# Patient Record
Sex: Female | Born: 1955 | Race: White | Hispanic: No | Smoking: Current every day smoker
Health system: Southern US, Community
[De-identification: ages and names within clinical notes are randomized; demographics above are authoritative.]

## PROBLEM LIST (undated history)

## (undated) DIAGNOSIS — M25562 Pain in left knee: Secondary | ICD-10-CM

## (undated) DIAGNOSIS — E1165 Type 2 diabetes mellitus with hyperglycemia: Secondary | ICD-10-CM

## (undated) DIAGNOSIS — C7931 Secondary malignant neoplasm of brain: Secondary | ICD-10-CM

## (undated) DIAGNOSIS — F32A Depression, unspecified: Secondary | ICD-10-CM

## (undated) DIAGNOSIS — F319 Bipolar disorder, unspecified: Secondary | ICD-10-CM

## (undated) DIAGNOSIS — D649 Anemia, unspecified: Secondary | ICD-10-CM

## (undated) DIAGNOSIS — K5900 Constipation, unspecified: Secondary | ICD-10-CM

## (undated) DIAGNOSIS — J449 Chronic obstructive pulmonary disease, unspecified: Secondary | ICD-10-CM

## (undated) DIAGNOSIS — I4891 Unspecified atrial fibrillation: Secondary | ICD-10-CM

## (undated) DIAGNOSIS — Z9289 Personal history of other medical treatment: Secondary | ICD-10-CM

## (undated) DIAGNOSIS — R32 Unspecified urinary incontinence: Secondary | ICD-10-CM

## (undated) DIAGNOSIS — F419 Anxiety disorder, unspecified: Secondary | ICD-10-CM

## (undated) DIAGNOSIS — F410 Panic disorder [episodic paroxysmal anxiety] without agoraphobia: Secondary | ICD-10-CM

## (undated) DIAGNOSIS — F329 Major depressive disorder, single episode, unspecified: Secondary | ICD-10-CM

## (undated) DIAGNOSIS — C3490 Malignant neoplasm of unspecified part of unspecified bronchus or lung: Secondary | ICD-10-CM

## (undated) DIAGNOSIS — I10 Essential (primary) hypertension: Secondary | ICD-10-CM

## (undated) DIAGNOSIS — G8929 Other chronic pain: Secondary | ICD-10-CM

## (undated) DIAGNOSIS — E1142 Type 2 diabetes mellitus with diabetic polyneuropathy: Secondary | ICD-10-CM

## (undated) DIAGNOSIS — Z923 Personal history of irradiation: Secondary | ICD-10-CM

## (undated) DIAGNOSIS — IMO0001 Reserved for inherently not codable concepts without codable children: Secondary | ICD-10-CM

## (undated) DIAGNOSIS — Z9221 Personal history of antineoplastic chemotherapy: Secondary | ICD-10-CM

## (undated) DIAGNOSIS — E7800 Pure hypercholesterolemia, unspecified: Secondary | ICD-10-CM

## (undated) DIAGNOSIS — K219 Gastro-esophageal reflux disease without esophagitis: Secondary | ICD-10-CM

## (undated) HISTORY — DX: Type 2 diabetes mellitus with diabetic polyneuropathy: E11.42

## (undated) HISTORY — DX: Reserved for inherently not codable concepts without codable children: IMO0001

## (undated) HISTORY — DX: Type 2 diabetes mellitus with hyperglycemia: E11.65

## (undated) HISTORY — DX: Essential (primary) hypertension: I10

## (undated) HISTORY — DX: Unspecified atrial fibrillation: I48.91

## (undated) HISTORY — DX: Pain in left knee: M25.562

## (undated) HISTORY — PX: SKIN GRAFT: SHX250

## (undated) HISTORY — DX: Malignant neoplasm of unspecified part of unspecified bronchus or lung: C34.90

## (undated) HISTORY — DX: Other chronic pain: G89.29

---

## 1998-04-10 ENCOUNTER — Emergency Department (HOSPITAL_COMMUNITY): Admission: EM | Admit: 1998-04-10 | Discharge: 1998-04-10 | Payer: Self-pay | Admitting: Emergency Medicine

## 2005-07-16 ENCOUNTER — Emergency Department (HOSPITAL_COMMUNITY): Admission: EM | Admit: 2005-07-16 | Discharge: 2005-07-16 | Payer: Self-pay | Admitting: Emergency Medicine

## 2012-11-10 ENCOUNTER — Ambulatory Visit (INDEPENDENT_AMBULATORY_CARE_PROVIDER_SITE_OTHER): Payer: No Typology Code available for payment source | Admitting: Internal Medicine

## 2012-11-10 ENCOUNTER — Encounter: Payer: Self-pay | Admitting: Internal Medicine

## 2012-11-10 VITALS — BP 110/72 | HR 98 | Temp 98.5°F | Ht 65.5 in | Wt 205.2 lb

## 2012-11-10 DIAGNOSIS — E785 Hyperlipidemia, unspecified: Secondary | ICD-10-CM

## 2012-11-10 DIAGNOSIS — G8929 Other chronic pain: Secondary | ICD-10-CM | POA: Insufficient documentation

## 2012-11-10 DIAGNOSIS — I10 Essential (primary) hypertension: Secondary | ICD-10-CM | POA: Insufficient documentation

## 2012-11-10 DIAGNOSIS — M25569 Pain in unspecified knee: Secondary | ICD-10-CM

## 2012-11-10 DIAGNOSIS — R5383 Other fatigue: Secondary | ICD-10-CM

## 2012-11-10 DIAGNOSIS — E669 Obesity, unspecified: Secondary | ICD-10-CM

## 2012-11-10 DIAGNOSIS — M25562 Pain in left knee: Secondary | ICD-10-CM

## 2012-11-10 MED ORDER — AGAMATRIX PRESTO W/DEVICE KIT: 1.0000 | PACK | Freq: Once | Status: DC

## 2012-11-10 MED ORDER — IBUPROFEN 200 MG PO TABS: 400.0000 mg | ORAL_TABLET | Freq: Four times a day (QID) | ORAL | Status: DC | PRN

## 2012-11-10 MED ORDER — GLUCOSE BLOOD VI STRP: ORAL_STRIP | Status: DC

## 2012-11-10 MED ORDER — OMEGA-3 FATTY ACIDS 1000 MG PO CAPS: 2.0000 g | ORAL_CAPSULE | Freq: Every day | ORAL | Status: DC

## 2012-11-10 MED ORDER — METFORMIN HCL 1000 MG PO TABS: 1000.0000 mg | ORAL_TABLET | Freq: Two times a day (BID) | ORAL | Status: DC

## 2012-11-10 NOTE — Progress Notes (Signed)
Patient: Anita Gardner   MRN: [id number]  DOB: 03-24-1956  PCP: Elyn Peers, DO   Subjective:    HPI: Ms. Anita Gardner is a 56 y.o. female with a PMHx of uncontrolled DM2, HTN, and knee pain who presented to clinic today for the following:  1) DM2, uncontrolled -  Patient not checking her blood. Denies assisted hypoglycemia or recently hospitalizations for either hyper or hypoglycemia. admits to polyuria, Denies polydipsia, nausea, vomiting, diarrhea, abdominal pain. The patient indicates that she does occasionally have lightheadedness, however she does not check her blood sugar at this time, therefore is unable to assess if she is getting hypoglycemic. Is on Metformin 1000mg  BID and Glyburide 5 mg BID. does request refills today.  In regards to diabetic complications:  Microvascular complications: Confirms: none ; Denies nephropathy, retinopathy and peripheral neuropathy.  Macrovascular complications: Confirms: none ; Denies cardiovascular disease, cerebrovascular disease and peripheral vascular disease.   Important diabetic medications: Is patient on aspirin? No Is patient on a statin? No Is patient on an ACE-I/ ARB? Yes  2) HTN - Patient does not check blood pressure regularly at home. Currently taking Lisinopril 20mg . Patient misses doses 0 x per week on average. denies headaches, dizziness, lightheadedness, chest pain, shortness of breath.  does not request refills today.   3) Knee pain -  Patient describes a 2 years history of gradually worsening, constant unless at rest, aching left knee pain. Episodes can last for a day at a time. Currently, the pain is rated a 8/10 in severity. At its worst, the pain is rated a 10/10 in severity. Pain is worse with activity she admits to inciting injury, specifically she had fallen on this knee several years ago. She confirms occasional popping sensations, but denies giving way, locking sensations. States that she has previously  been on Naproxen 500mg  BID (but is not currently on that medication) - she found this somewhat effective in the past, but feels that she needs something stronger. She is aware that because she is just establishing care today, and I have no historical labs, I will not be prescribing anything for her today.   Review of Systems: Per HPI and patient also confirms: Hair thinning.   Current Outpatient Medications: Medication Sig  . cyclobenzaprine (FLEXERIL) 5 MG tablet Take 5 mg by mouth 3 (three) times daily as needed.  . glyBURIDE (DIABETA) 2.5 MG tablet Take 5 mg by mouth 2 (two) times daily with a meal.  . lisinopril (PRINIVIL,ZESTRIL) 20 MG tablet Take 20 mg by mouth daily.  . metFORMIN (GLUCOPHAGE) 1000 MG tablet Take 1,000 mg by mouth 2 (two) times daily with a meal.  . naproxen (NAPROSYN) 500 MG tablet Take 500 mg by mouth 2 (two) times daily as needed.     Allergies  Allergen Reactions  . Ampicillin Hives    Past Medical History  Diagnosis Date  . Hypertension   . Diabetes mellitus type 2, uncontrolled, without complications DX: 2000  . Chronic pain of left knee     Past Surgical History  Procedure Date  . No past surgeries     Family History  Problem Relation Age of Onset  . CAD Mother 34  . Diabetes Mother     Social History History   Social History  . Marital Status: Unknown    Spouse Name: N/A    Number of Children: 2  . Years of Education: 10th grade   Occupational History  . 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 Smoking status: Former Smoker -- 0.8 packs/day for 20 years    Types: Cigarettes    Quit date: 11/23/2011  . Smokeless tobacco: Not on file  . Alcohol Use: No  . Drug Use: No  . Sexually Active: Not on file   Other Topics Concern  . Not on file   Social History Narrative   Lives at [facility] alone     Objective:    Physical Exam: Filed Vitals:   11/10/12 1559  BP:  110/72  Pulse: 98  Temp: 98.5 F (36.9 C)     General: Vital signs reviewed and noted. Well-developed, well-nourished, in no acute distress; alert, appropriate and cooperative throughout examination.  Head: Normocephalic, atraumatic.  Eyes: conjunctivae/corneas clear. PERRL, EOM's intact.  Ears: TM nonerythematous, not bulging, good light reflex bilaterally.  Nose: Mucous membranes moist, not inflammed, nonerythematous.  Throat: Oropharynx nonerythematous, no exudate appreciated.   Neck: No deformities, masses, or tenderness noted.  Lungs:  Normal respiratory effort. Clear to auscultation BL without crackles or wheezes.  Heart: RRR. S1 and S2 normal without gallop, rubs. (+) murmur.  Abdomen:  BS normoactive. Soft, Nondistended, non-tender.  No masses or organomegaly.  Extremities: No pretibial edema. Crepitation noted bilateral with knee flexion and extension. No edema, no erythema, no warmth of bilateral knees.   Neurologic: A&O X3, CN II - XII are grossly intact. Motor strength is 5/5 in the all 4 extremities, Sensations intact to light touch, Cerebellar signs negative.    Assessment/ Plan:   Case and plan of care discussed with attending physician, Dr. Blanch Media.

## 2012-11-10 NOTE — Patient Instructions (Signed)
General Instructions:  Please follow-up at the clinic in 2-3 weeks, at which time we will reevaluate YOUR KNEE PAIN, diabetes, blood pressure, fatigue- OR, please follow-up in the clinic sooner if needed.  There have not been changes in your medications:  Prescription for a glucometer has been sent to the health department  Check your blood sugars once daily before breakfast or if you are feeling bad.    You are getting labs today, if they are abnormal I will give you a call.   You can use Ibuprofen for your knee pain - take with meals and stop if your stomach is hurting.  If you have been started on new medication(s), and you develop symptoms concerning for allergic reaction, including, but not limited to, throat closing, tongue swelling, rash, please stop the medication immediately and call the clinic at [phone], and go to the ER.  If you are diabetic, please bring your meter to your next visit.  If symptoms worsen, or new symptoms arise, please call the clinic or go to the ER.  PLEASE BRING ALL OF YOUR MEDICATIONS  IN A BAG TO YOUR NEXT APPOINTMENT

## 2012-11-11 DIAGNOSIS — R5383 Other fatigue: Secondary | ICD-10-CM | POA: Insufficient documentation

## 2012-11-11 DIAGNOSIS — E669 Obesity, unspecified: Secondary | ICD-10-CM | POA: Insufficient documentation

## 2012-11-11 DIAGNOSIS — E785 Hyperlipidemia, unspecified: Secondary | ICD-10-CM | POA: Insufficient documentation

## 2012-11-11 LAB — LIPID PANEL
HDL: 35 mg/dL — ABNORMAL LOW (ref >39)
LDL Cholesterol: 110 mg/dL — ABNORMAL HIGH (ref 0–99)
Triglycerides: 281 mg/dL — ABNORMAL HIGH (ref <150)
VLDL: 56 mg/dL — ABNORMAL HIGH (ref 0–40)

## 2012-11-11 LAB — COMPREHENSIVE METABOLIC PANEL
ALT: 20 U/L (ref 0–35)
AST: 16 U/L (ref 0–37)
Albumin: 4.7 g/dL (ref 3.5–5.2)
Alkaline Phosphatase: 84 U/L (ref 39–117)
Calcium: 10.1 mg/dL (ref 8.4–10.5)
Glucose, Bld: 263 mg/dL — ABNORMAL HIGH (ref 70–99)
Potassium: 4.1 mEq/L (ref 3.5–5.3)
Sodium: 132 mEq/L — ABNORMAL LOW (ref 135–145)
Total Protein: 7.5 g/dL (ref 6.0–8.3)

## 2012-11-11 LAB — MICROALBUMIN / CREATININE URINE RATIO
Creatinine, Urine: 352.7 mg/dL
Microalb Creat Ratio: 88.7 mg/g — ABNORMAL HIGH (ref 0.0–30.0)

## 2012-11-11 LAB — CBC WITH DIFFERENTIAL/PLATELET
Basophils Relative: 1 % (ref 0–1)
Eosinophils Absolute: 0.1 10*3/uL (ref 0.0–0.7)
HCT: 45.8 % (ref 36.0–46.0)
Hemoglobin: 16.1 g/dL — ABNORMAL HIGH (ref 12.0–15.0)
Lymphs Abs: 2.4 10*3/uL (ref 0.7–4.0)
MCH: 32.3 pg (ref 26.0–34.0)
MCHC: 35.2 g/dL (ref 30.0–36.0)
Monocytes Absolute: 0.6 10*3/uL (ref 0.1–1.0)
Monocytes Relative: 6 % (ref 3–12)
Neutro Abs: 7 10*3/uL (ref 1.7–7.7)
Platelets: 343 10*3/uL (ref 150–400)
RBC: 4.99 MIL/uL (ref 3.87–5.11)

## 2012-11-11 LAB — HEMOGLOBIN A1C
Hgb A1c MFr Bld: 10.2 % — ABNORMAL HIGH (ref <5.7)
Mean Plasma Glucose: 246 mg/dL — ABNORMAL HIGH (ref <117)

## 2012-11-11 LAB — TSH: TSH: 1.327 u[IU]/mL (ref 0.350–4.500)

## 2012-11-11 NOTE — Assessment & Plan Note (Signed)
Pertinent Labs: Liver Function Tests:    Component Value Date/Time   AST 16 11/10/2012 1652   ALT 20 11/10/2012 1652   ALKPHOS 84 11/10/2012 1652   BILITOT 0.8 11/10/2012 1652   PROT 7.5 11/10/2012 1652   ALBUMIN 4.7 11/10/2012 1652    Lipid Panel:     Component Value Date/Time   CHOL 201* 11/10/2012 1652   TRIG 281* 11/10/2012 1652   HDL 35* 11/10/2012 1652   CHOLHDL 5.7 11/10/2012 1652   VLDL 56* 11/10/2012 1652   LDLCALC 110* 11/10/2012 1652     Assessment: Goal LDL (per ATP guidelines): < 100 mg/dL  Disease Control: not controlled  Progress toward goals: unable to assess  Barriers to meeting goals: financial need    Patient is currently just on fish oil, no statin.   Plan:  continue current medications  Will call pt and plan to initiate statin with recheck lipid panel in 6 weeks after statin initiation.

## 2012-11-11 NOTE — Progress Notes (Signed)
Quick Note:  Uncontrolled DM with proteinuria. On an ACE-I already. Recheck 1 year. ______

## 2012-11-11 NOTE — Assessment & Plan Note (Addendum)
Pertinent Labs: Lab Results  Component Value Date   HGBA1C 10.2* 11/10/2012   CREATININE 0.62 11/10/2012   MICROALBUR 31.30* 11/10/2012   MICRALBCREAT 88.7* 11/10/2012   CHOL 201* 11/10/2012   HDL 35* 11/10/2012   TRIG 161* 11/10/2012    Assessment: Disease Control:   Uncontrolled  Progress toward goals:   Unable to assess  Barriers to meeting goals: financial need  Microvascular complications: none  Macrovascular complications: none  On aspirin: No: 1% 10-year risk of CV event, therefore, not indicated at this time.     On statin: No: getting lipid panel today.  On ACE-I/ ARB: Yes    Patient is unclear about her compliance given that she has not had regularly clinic follow-up since 2012 at prior PCP.  with prescribed medications.   Her A1c and additional labs resulted after our clinic visit today.   I will not adjust her medication today, as unclear if this A1c reflects poor blood sugar control despite adequate oral regimen OR if 2/2 medication noncompliance. As well, since she does not have a glucometer for > 1 year, it is difficult to correlate with daily blood sugars.   Plan: Glucometer log was not reviewed today, as pt did not have glucometer available for review.   continue current medications  I am getting labs today.  Recommend to get a glucometer and check blood sugars at least once a day before breakfast.  RX for glucometer sent to health department.  Will need to discuss at next visit, if she would consider initiation of insulin versus stressing medication compliance and following up at later time.  Will call pt to initiate a statin.  reminded to bring blood glucose meter & log to each visit  Educational resources provided:    Self management tools provided:    Home glucose monitoring recommendation:    Check blood sugars once daily before breakfast and PRN when she is feeling poorly, particularly when she is feeling shaky.

## 2012-11-11 NOTE — Progress Notes (Signed)
Quick Note:  LDL not at goal, and although may be accomplished with lifestyle modification, I think that her cardiovascular risk factors are higher with her age, prolonged tobacco abuse (abstinent since Jan 2013), HLD, obesity, therefore, I think she would benefit from addition of a statin. Will call pt to inform. ______

## 2012-11-11 NOTE — Assessment & Plan Note (Addendum)
Pertinent Data: BP Readings from Last 3 Encounters:  11/10/12 110/72    Basic Metabolic Panel:    Component Value Date/Time   NA 132* 11/10/2012 1652   K 4.1 11/10/2012 1652   CL 95* 11/10/2012 1652   CO2 25 11/10/2012 1652   BUN 8 11/10/2012 1652   CREATININE 0.62 11/10/2012 1652   GLUCOSE 263* 11/10/2012 1652   CALCIUM 10.1 11/10/2012 1652    Assessment: Disease Control:  Controlled  Progress toward goals:  At goal  Barriers to meeting goals: financial need    Patient is noncompliant some of the time with prescribed medications.   Plan:  continue current medications  Educational resources provided:    Self management tools provided:

## 2012-11-11 NOTE — Assessment & Plan Note (Signed)
Assessment: Physical exam and history most consistent with osteoarthritis of actually bilateral knees. No significant effusion, erythema, or warmth noted to suggest septic joint. Patient has previously been well controlled on naproxen 500 mg BID. I do not feel comfortable prescribing her additional medications at this time as she is completely new to the health system and has no labs to assess her renal or hepatic function. I recommended OTC ibuprofen and heat or ice carefully. I think that as opposed to giving strong and RTC for reevaluation after labs are completed.   Plan:      OTC ibuprofen with food for now.   Consider Naproxen if ibuprofen is ineffective, may also consider steroid injections in the future to provide possibly longer relief.

## 2012-11-24 ENCOUNTER — Emergency Department (INDEPENDENT_AMBULATORY_CARE_PROVIDER_SITE_OTHER)
Admission: EM | Admit: 2012-11-24 | Discharge: 2012-11-24 | Disposition: A | Discharge status: Home / Self Care | Payer: No Typology Code available for payment source | Source: Home / Self Care

## 2012-11-24 ENCOUNTER — Encounter (HOSPITAL_COMMUNITY): Payer: Self-pay | Admitting: Emergency Medicine

## 2012-11-24 ENCOUNTER — Other Ambulatory Visit: Payer: Self-pay | Admitting: *Deleted

## 2012-11-24 DIAGNOSIS — M25562 Pain in left knee: Secondary | ICD-10-CM

## 2012-11-24 DIAGNOSIS — N390 Urinary tract infection, site not specified: Secondary | ICD-10-CM

## 2012-11-24 DIAGNOSIS — E119 Type 2 diabetes mellitus without complications: Secondary | ICD-10-CM

## 2012-11-24 DIAGNOSIS — R109 Unspecified abdominal pain: Secondary | ICD-10-CM

## 2012-11-24 LAB — POCT URINALYSIS DIP (DEVICE)
Ketones, ur: 15 mg/dL — AB
Protein, ur: >=300 mg/dL — AB
Specific Gravity, Urine: >=1.03 (ref 1.005–1.030)
Urobilinogen, UA: 1 mg/dL (ref 0.0–1.0)

## 2012-11-24 MED ORDER — CIPROFLOXACIN HCL 500 MG PO TABS: 500.0000 mg | ORAL_TABLET | Freq: Two times a day (BID) | ORAL | Status: DC

## 2012-11-24 MED ORDER — LISINOPRIL 20 MG PO TABS: 20.0000 mg | ORAL_TABLET | Freq: Every day | ORAL | Status: DC

## 2012-11-24 MED ORDER — METFORMIN HCL 1000 MG PO TABS: 1000.0000 mg | ORAL_TABLET | Freq: Two times a day (BID) | ORAL | Status: DC

## 2012-11-24 MED ORDER — GLYBURIDE 5 MG PO TABS: 5.0000 mg | ORAL_TABLET | Freq: Two times a day (BID) | ORAL | Status: DC

## 2012-11-24 NOTE — ED Notes (Signed)
Pt c/o poss UTI x4 weeks. Sx include: abd pain that radiates to the back, nauseas, dizziness, constipated x2 days., foul urine odor and cloudy color  Had diarrhea x3 days ago. Denies: fevers, vomiting, dysuria  She is alert w/no signs of acute distress.

## 2012-11-24 NOTE — ED Provider Notes (Signed)
History     CSN: [id number]  Arrival date & time 11/24/12  1404   First MD Initiated Contact with Patient 11/24/12 1436      Chief Complaint  Patient presents with  . Urinary Tract Infection    (Consider location/radiation/quality/duration/timing/severity/associated sxs/prior treatment) HPI Comments: 57 year old female presenting with chief complaint of urinary tract infection. She is describing pain across her mid to lower abdomen for one month. He describes it as mild but slightly worse in the past week. She denies having urinary frequency or dysuria. She states the last time that she had a UTI she had identical symptoms. She is also complaining of malodorous urine. She has nausea without vomiting. She denies chest pain heaviness tightness fullness or pressure, shortness of breath or bleeding from any source. She is a type II diabetic and has a history of frequent urinary tract infections. She often states that she does not have typical urinary symptoms with her infection. She notes that she has not had a bowel movement in 3-4 days. She denies fever, chills, URI symptoms   Past Medical History  Diagnosis Date  . Hypertension   . Diabetes mellitus type 2, uncontrolled, without complications DX: 2000  . Chronic pain of left knee     Past Surgical History  Procedure Date  . No past surgeries     Family History  Problem Relation Age of Onset  . CAD Mother 82  . Diabetes Mother     History  Substance Use Topics  . Smoking status: Current Every Day Smoker -- 0.8 packs/day for 20 years    Types: Cigarettes    Last Attempt to Quit: 11/23/2011  . Smokeless tobacco: Not on file  . Alcohol Use: No    OB History    Grav Para Term Preterm Abortions TAB SAB Ect Mult Living                  Review of Systems  Constitutional: Negative for fever, chills, activity change and fatigue.  HENT: Negative.   Respiratory: Negative.   Cardiovascular: Negative.   Gastrointestinal:  Positive for nausea, abdominal pain and constipation. Negative for vomiting, blood in stool and abdominal distention.  Genitourinary: Negative for dysuria, flank pain, vaginal bleeding, vaginal discharge and pelvic pain.  Musculoskeletal:       Complains of pain across her lower most back just above her hips. Pain is worse with movement and bending. Denies any known injury or movement which may have caused the pain. Denies flank pain.  Skin: Negative.   Psychiatric/Behavioral: Negative.     Allergies  Ampicillin  Home Medications   Current Outpatient Rx  Name  Route  Sig  Dispense  Refill  . CYCLOBENZAPRINE HCL 5 MG PO TABS   Oral   Take 5 mg by mouth 3 (three) times daily as needed.         . GLYBURIDE 2.5 MG PO TABS   Oral   Take 5 mg by mouth 2 (two) times daily with a meal.         . LISINOPRIL 20 MG PO TABS   Oral   Take 20 mg by mouth daily.         Marland Kitchen METFORMIN HCL 1000 MG PO TABS   Oral   Take 1 tablet (1,000 mg total) by mouth 2 (two) times daily with a meal.   60 tablet   1   . AGAMATRIX PRESTO W/DEVICE KIT   Does not apply  1 Device by Does not apply route once.   1 kit   0   . CIPROFLOXACIN HCL 500 MG PO TABS   Oral   Take 1 tablet (500 mg total) by mouth every 12 (twelve) hours.   14 tablet   0   . OMEGA-3 FATTY ACIDS 1000 MG PO CAPS   Oral   Take 2 capsules (2 g total) by mouth daily.         Marland Kitchen GLUCOSE BLOOD VI STRP      Check blood sugar once daily before breakfast or if you are feeling poorly. DX code: 250.00   100 each   12   . IBUPROFEN 200 MG PO TABS   Oral   Take 2 tablets (400 mg total) by mouth every 6 (six) hours as needed for pain (WITH FOOD).   100 tablet   2     BP 111/67  Pulse 104  Temp 98 F (36.7 C) (Oral)  Resp 20  SpO2 98%  Physical Exam  Nursing note and vitals reviewed. Constitutional: She is oriented to person, place, and time. She appears well-nourished. No distress.       Morbid obesity  Eyes:  Conjunctivae normal and EOM are normal.  Neck: Normal range of motion. Neck supple.  Cardiovascular: Normal rate, regular rhythm and normal heart sounds.   Pulmonary/Chest: Effort normal and breath sounds normal. No respiratory distress. She has no wheezes. She has no rales.  Abdominal: Soft. She exhibits no distension and no mass. There is no rebound and no guarding.       Minor discomfort with palpation across the mid and lower abdomen. Otherwise normal abdominal exam.  Musculoskeletal: She exhibits no edema.       Tenderness in the paralumbar musculature just above the posterior hips.  Lymphadenopathy:    She has no cervical adenopathy.  Neurological: She is alert and oriented to person, place, and time. She exhibits normal muscle tone.  Skin: Skin is warm and dry.  Psychiatric: She has a normal mood and affect.    ED Course  Procedures (including critical care time)  Labs Reviewed  POCT URINALYSIS DIP (DEVICE) - Abnormal; Notable for the following:    Glucose, UA 250 (*)     Bilirubin Urine MODERATE (*)     Ketones, ur 15 (*)     Hgb urine dipstick TRACE (*)     Protein, ur >=300 (*)     Leukocytes, UA TRACE (*)  Biochemical Testing Only. Please order routine urinalysis from main lab if confirmatory testing is needed.   All other components within normal limits  URINE CULTURE   No results found.   1. UTI (lower urinary tract infection)   2. Abdominal  pain, other specified site   3. Diabetes       MDM  Cipro 500 mg twice a day for 7 days Plenty of fluids and stay well-hydrated She does not have an acute abdomen nor does she have acute abdominal pain. The pain is intermittent and relatively mild. She is instructed to call her physician for followup appointment regarding this discomfort. If it is persistent beyond 3 days she is to make this appointment. If it is associated with vomiting, fever, diarrhea increased intensity or frequency she is to seek medical attention  immediately. She is stable upon discharge and does not appear acutely ill or toxic.        Hayden Rasmussen, NP 11/24/12 1520

## 2012-11-25 LAB — URINE CULTURE
Colony Count: NO GROWTH
Culture: NO GROWTH

## 2012-11-28 NOTE — ED Provider Notes (Signed)
Medical screening examination/treatment/procedure(s) were performed by resident physician or non-physician practitioner and as supervising physician I was immediately available for consultation/collaboration.   Burleigh Brockmann DOUGLAS MD.    Kaz Auld D Terissa Haffey, MD 11/28/12 1355 

## 2012-11-29 ENCOUNTER — Encounter: Payer: Self-pay | Admitting: Internal Medicine

## 2012-11-29 ENCOUNTER — Ambulatory Visit (INDEPENDENT_AMBULATORY_CARE_PROVIDER_SITE_OTHER): Payer: No Typology Code available for payment source | Admitting: Internal Medicine

## 2012-11-29 VITALS — BP 124/80 | HR 112 | Temp 97.8°F | Ht 65.0 in | Wt 198.1 lb

## 2012-11-29 DIAGNOSIS — E119 Type 2 diabetes mellitus without complications: Secondary | ICD-10-CM

## 2012-11-29 DIAGNOSIS — R110 Nausea: Secondary | ICD-10-CM

## 2012-11-29 DIAGNOSIS — R109 Unspecified abdominal pain: Secondary | ICD-10-CM

## 2012-11-29 LAB — CBC WITH DIFFERENTIAL/PLATELET
Basophils Absolute: 0 10*3/uL (ref 0.0–0.1)
Basophils Relative: 0 % (ref 0–1)
Eosinophils Absolute: 0.1 10*3/uL (ref 0.0–0.7)
Eosinophils Relative: 1 % (ref 0–5)
MCH: 32.4 pg (ref 26.0–34.0)
MCV: 88.9 fL (ref 78.0–100.0)
Neutrophils Relative %: 78 % — ABNORMAL HIGH (ref 43–77)
Platelets: 367 10*3/uL (ref 150–400)
RDW: 13.1 % (ref 11.5–15.5)

## 2012-11-29 LAB — LIPASE: Lipase: 55 U/L (ref 0–75)

## 2012-11-29 MED ORDER — OXYCODONE-ACETAMINOPHEN 5-325 MG PO TABS: 1.0000 | ORAL_TABLET | Freq: Three times a day (TID) | ORAL | Status: DC | PRN

## 2012-11-29 MED ORDER — ONDANSETRON HCL 4 MG PO TABS: 4.0000 mg | ORAL_TABLET | Freq: Two times a day (BID) | ORAL | Status: DC | PRN

## 2012-11-29 NOTE — Assessment & Plan Note (Addendum)
Etiology is not clear currently. Although patient has had symptoms of urinary urgency and has been treated for UTI, her urine culture has no growth. Her urinary urgency has resolved, but her abdominal pain still persists, indicating UTI does not explain her abdominal pain. Other differentials include, acute pancreatitis, left side appendicitis (less likely given no fever and chills and no leukocytosis), pyelonephritis (her Urinalysis and culture do not support this diagnosis), kidney stone (possible given Hgb on UA), diverticulosis (possible given her age and location of abd pain). Currently patient is hemodynamically stable, no fever or chills.  -Pain control with Percocet -Will get CT of abd/pelvis -Will check BMP, CBC and lipase -will let her complete the course of ciprofloxacin

## 2012-11-29 NOTE — Patient Instructions (Addendum)
1. please continue to complete the course of your ciprofloxacin. I will call you when the test results comes back.  2. Please come back to the hospital immediately if your symptoms get worse. 3. If you have worsening of your symptoms or new symptoms arise, please call the clinic ([phone]), or go to the ER immediately if symptoms are severe.

## 2012-11-29 NOTE — Progress Notes (Signed)
[name], female   DOB: October 14, 1956, 57 y.o.   MRN: [id number] Subjective:   [name]   DOB: [birth year] 57 y.o.   MRN: [id number]  CC:   Abdominal pain for 5 weeks.  HPI: [name] is a 57 y.o. lady with past medical history as outlined below, who presents for a followup visit following her recent visit to urgent care due to abdominal pain.  Patient started having abdominal pain at 5 weeks ago. It is gradually getting worse in the last 2 weeks. Her abdominal pain is located at the left lower quadrant and the left middle quadrant, 10 out of 10 in severity currently, sharp, radiating to left back. It is aggravated by eating food and alleviated partially by Tylenol. The abdominal pain is associated with nausea, but not vomiting. Patient does not have fever or chills. She had mild urinary urgency recently, but did not have burning or pain when she urinates. Patient visited urgent care because of worsening of abdominal pain on 11/24/12. She had urinalysis which showed trace amount of leukocyte and negative nitrate. Urine cultures had no growth. She was treated with ciprofloxacin for UTI by urgent care physician for 7 days. She said her urinary urgency has resolved, but her abdominal pain persists, is even worse.  Patient has chronic constipation, no history of recent antibiotic use before 11/24/12. She strongly denies using alcohol since 1996. She did not notice any blood in her stool or urine.  Past Medical History  Diagnosis Date  . Hypertension   . Diabetes mellitus type 2, uncontrolled, without complications DX: 2000  . Chronic pain of left knee    Current Outpatient Prescriptions  Medication Sig Dispense Refill  . Blood Glucose Monitoring Suppl (AGAMATRIX PRESTO) W/DEVICE KIT 1 Device by Does not apply route once.  1 kit  0  . ciprofloxacin (CIPRO) 500 MG tablet Take 1 tablet (500 mg total) by mouth every 12 (twelve) hours.  14 tablet  0  .  cyclobenzaprine (FLEXERIL) 5 MG tablet Take 5 mg by mouth 3 (three) times daily as needed.      . fish oil-omega-3 fatty acids 1000 MG capsule Take 2 capsules (2 g total) by mouth daily.      Marland Kitchen glucose blood test strip Check blood sugar once daily before breakfast or if you are feeling poorly. DX code: 250.00  100 each  12  . glyBURIDE (DIABETA) 5 MG tablet Take 1 tablet (5 mg total) by mouth 2 (two) times daily with a meal.  180 tablet  1  . ibuprofen (ADVIL) 200 MG tablet Take 2 tablets (400 mg total) by mouth every 6 (six) hours as needed for pain (WITH FOOD).  100 tablet  2  . lisinopril (PRINIVIL,ZESTRIL) 20 MG tablet Take 1 tablet (20 mg total) by mouth daily.  90 tablet  1  . metFORMIN (GLUCOPHAGE) 1000 MG tablet Take 1 tablet (1,000 mg total) by mouth 2 (two) times daily with a meal.  180 tablet  1  . ondansetron (ZOFRAN) 4 MG tablet Take 1 tablet (4 mg total) by mouth every 12 (twelve) hours as needed for nausea.  30 tablet  1  . oxyCODONE-acetaminophen (ROXICET) 5-325 MG per tablet Take 1 tablet by mouth every 8 (eight) hours as needed for pain.  21 tablet  0   Family History  Problem Relation Age of Onset  . CAD Mother 58  . Diabetes Mother    History  Social History  . Marital Status: Divorced    Spouse Name: N/A    Number of Children: 2  . Years of Education: 10th grade   Occupational History  . 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 Smoking status: Current Every Day Smoker -- 0.8 packs/day for 20 years    Types: Cigarettes    Last Attempt to Quit: 11/23/2011  . Smokeless tobacco: None  . Alcohol Use: No  . Drug Use: No  . Sexually Active: None   Other Topics Concern  . None   Social History Narrative   Lives at [place] alone   Review of Systems: General: no fevers, chills. Decreased appetite due to nausea Skin: no rash HEENT: no blurry vision, hearing changes or sore throat. 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 Pulm: no dyspnea, coughing, wheezing CV: no chest pain, palpitations, shortness of breath Abd: has nausea and abdominal pain and constipation, No vomiting.  GU: no dysuria, hematuria, polyuria Ext: has knee pain on the left side Neuro: no weakness, numbness, or tingling  Objective:  Physical Exam: Filed Vitals:   11/29/12 1457  BP: 124/80  Pulse: 112  Temp: 97.8 F (36.6 C)  TempSrc: Oral  Height: 5\' 5"  (1.651 m)  Weight: 198 lb 1.6 oz (89.858 kg)  SpO2: 96%   General: Not in acute distress HEENT: PERRL, EOMI, no scleral icterus, No bruit or JVD. 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 Cardiac: S1/S2, RRR, No murmurs, gallops or rubs Pulm: Good air movement bilaterally, Clear to auscultation bilaterally, No rales, wheezing, rhonchi or rubs. Abd: Soft,  Non-distended. Tender over epigastric area, RUQ, LMQ and LLQ, no rebound pain, no organomegaly, BS present. No CVA tenderness. Ext: No rashes or edema, 2+DP/PT pulse bilaterally Musculoskeletal: No joint deformities, erythema. Left knee is TTP. Skin: no rashes. No skin bruise. Neuro: Alert and oriented X3, cranial nerves II-XII grossly intact, muscle strength 5/5 in all extremeties,  sensation to light touch intact. Brachial reflexes 1+ bilaterally. Knee reflexes 2+ bilaterally. Babinski's sign negative. Psych: patient is not psychotic, no suicidal or hemocidal ideation.   Assessment & Plan:

## 2012-11-30 LAB — BASIC METABOLIC PANEL WITH GFR
CO2: 24 mEq/L (ref 19–32)
Calcium: 10.2 mg/dL (ref 8.4–10.5)
Creat: 0.75 mg/dL (ref 0.50–1.10)
GFR, Est African American: >89 mL/min
Glucose, Bld: 280 mg/dL — ABNORMAL HIGH (ref 70–99)
Sodium: 127 mEq/L — ABNORMAL LOW (ref 135–145)

## 2012-12-01 ENCOUNTER — Other Ambulatory Visit: Payer: Self-pay | Admitting: Internal Medicine

## 2012-12-01 ENCOUNTER — Ambulatory Visit (HOSPITAL_COMMUNITY): Payer: No Typology Code available for payment source

## 2012-12-01 DIAGNOSIS — R109 Unspecified abdominal pain: Secondary | ICD-10-CM

## 2012-12-04 ENCOUNTER — Ambulatory Visit (HOSPITAL_COMMUNITY): Admission: RE | Admit: 2012-12-04 | Payer: No Typology Code available for payment source | Source: Ambulatory Visit

## 2012-12-05 ENCOUNTER — Ambulatory Visit (HOSPITAL_COMMUNITY): Payer: No Typology Code available for payment source

## 2012-12-06 ENCOUNTER — Telehealth: Payer: Self-pay | Admitting: *Deleted

## 2012-12-06 ENCOUNTER — Ambulatory Visit (HOSPITAL_COMMUNITY)
Admission: RE | Admit: 2012-12-06 | Discharge: 2012-12-06 | Disposition: A | Discharge status: Home / Self Care | Payer: No Typology Code available for payment source | Source: Ambulatory Visit | Attending: Internal Medicine | Admitting: Internal Medicine

## 2012-12-06 DIAGNOSIS — R109 Unspecified abdominal pain: Secondary | ICD-10-CM | POA: Insufficient documentation

## 2012-12-06 DIAGNOSIS — M549 Dorsalgia, unspecified: Secondary | ICD-10-CM | POA: Insufficient documentation

## 2012-12-06 DIAGNOSIS — M5137 Other intervertebral disc degeneration, lumbosacral region: Secondary | ICD-10-CM | POA: Insufficient documentation

## 2012-12-06 DIAGNOSIS — M51379 Other intervertebral disc degeneration, lumbosacral region without mention of lumbar back pain or lower extremity pain: Secondary | ICD-10-CM | POA: Insufficient documentation

## 2012-12-06 DIAGNOSIS — J984 Other disorders of lung: Secondary | ICD-10-CM | POA: Insufficient documentation

## 2012-12-06 MED ORDER — IOHEXOL 300 MG/ML  SOLN
25.0000 mL | Freq: Once | INTRAMUSCULAR | Status: AC | PRN
  Administered 2012-12-06 (×2): 25 mL via INTRAVENOUS

## 2012-12-06 MED ORDER — IOHEXOL 300 MG/ML  SOLN
80.0000 mL | Freq: Once | INTRAMUSCULAR | Status: AC | PRN
  Administered 2012-12-06: 80 mL via INTRAVENOUS

## 2012-12-06 NOTE — Telephone Encounter (Signed)
Pt is called and scheduled appt for 1/22 at 0845, she is agreeable, w/ dr Clyde Lundborg

## 2012-12-06 NOTE — Telephone Encounter (Signed)
Myriam Jacobson, as we discussed, if we can please have Dr. Clyde Lundborg address this, I would appreciate it. I will not be in clinic and I think that this is an issue that needs to be addressed in person and with lengthened conversation.  Thank you! - [name], D.O., 12/06/2012, 4:15 PM

## 2012-12-06 NOTE — Telephone Encounter (Signed)
Thank you     Shelly

## 2012-12-06 NOTE — Telephone Encounter (Signed)
16mm nodule L lung please see first of message, PET/ CT recommended

## 2012-12-13 ENCOUNTER — Ambulatory Visit: Payer: No Typology Code available for payment source | Admitting: Internal Medicine

## 2012-12-14 ENCOUNTER — Ambulatory Visit (INDEPENDENT_AMBULATORY_CARE_PROVIDER_SITE_OTHER): Payer: No Typology Code available for payment source | Admitting: Internal Medicine

## 2012-12-14 ENCOUNTER — Encounter: Payer: Self-pay | Admitting: Internal Medicine

## 2012-12-14 VITALS — BP 140/89 | HR 109 | Temp 99.0°F | Ht 65.0 in | Wt 190.2 lb

## 2012-12-14 DIAGNOSIS — R918 Other nonspecific abnormal finding of lung field: Secondary | ICD-10-CM

## 2012-12-14 DIAGNOSIS — R109 Unspecified abdominal pain: Secondary | ICD-10-CM

## 2012-12-14 DIAGNOSIS — R911 Solitary pulmonary nodule: Secondary | ICD-10-CM

## 2012-12-14 DIAGNOSIS — K5900 Constipation, unspecified: Secondary | ICD-10-CM

## 2012-12-14 DIAGNOSIS — M549 Dorsalgia, unspecified: Secondary | ICD-10-CM

## 2012-12-14 MED ORDER — PANTOPRAZOLE SODIUM 20 MG PO TBEC: 40.0000 mg | DELAYED_RELEASE_TABLET | Freq: Every day | ORAL | Status: DC

## 2012-12-14 MED ORDER — POLYETHYLENE GLYCOL 3350 17 G PO PACK: 17.0000 g | PACK | Freq: Every day | ORAL | Status: DC

## 2012-12-14 MED ORDER — CYCLOBENZAPRINE HCL 5 MG PO TABS: 7.5000 mg | ORAL_TABLET | Freq: Three times a day (TID) | ORAL | Status: DC | PRN

## 2012-12-14 MED ORDER — OXYCODONE-ACETAMINOPHEN 5-325 MG PO TABS: 1.0000 | ORAL_TABLET | Freq: Three times a day (TID) | ORAL | Status: DC | PRN

## 2012-12-14 NOTE — Progress Notes (Addendum)
[name], female   DOB: July 07, 1956, 57 y.o.   MRN: [id number] [name], female   DOB: 05/14/1956, 57 y.o.   MRN: [id number] Subjective:   [name]   DOB: February 24, 1956 57 y.o.   MRN: [id number]  CC:   Follow up, Lung nodule noticed on CT scan.  HPI: [name] is a 57 y.o. lady with past medical history as outlined below, who presents for a followup visit.   The patient was seen in clinic on 11/29/12 because of 5 weeks of abdominal pain. Her abdominal pain was located at the left lower quadrant and the left middle quadrant, 10 out of 10 in severity, sharp, radiating to left back. It is aggravated by eating food and alleviated partially by Tylenol. The abdominal pain is associated with nausea, but not vomiting. Patient did not have fever or chills. She had mild urinary urgency, but did not have burning or pain when she urinates.  She had urinalysis which showed trace amount of leukocyte and negative nitrate. Urine cultures had no growth. She was treated with ciprofloxacin for UTI by urgent care physician for 7 days. She said her urinary urgency has resolved, but her abdominal pain persists.  CT-abdomen was ordered in previous visit, which did not pick any acute findings in the abdomen or pelvis to explain the patient's history of pain, but a 16 mm left lung nodule was detected. Patient reports that her abdominal pain improved slightly, but still has moderate abdominal pain. She feels mildly nauseated, but no vomiting recently. The nature and location of abdominal pain has not changed. She is constipated recently. She didn't have a bowel movement in past 9 days.  Patient is a long time smoker. She smoked 0.5 pack a day for more than 20 years. Her BW decreased from 93.Kg on 11/10/12 to 86 kg today.    Past Medical History  Diagnosis Date  . Hypertension   . Diabetes mellitus type 2, uncontrolled, without complications DX: 2000    . Chronic pain of left knee    Current Outpatient Prescriptions  Medication Sig Dispense Refill  . Blood Glucose Monitoring Suppl (AGAMATRIX PRESTO) W/DEVICE KIT 1 Device by Does not apply route once.  1 kit  0  . ciprofloxacin (CIPRO) 500 MG tablet Take 1 tablet (500 mg total) by mouth every 12 (twelve) hours.  14 tablet  0  . cyclobenzaprine (FLEXERIL) 5 MG tablet Take 5 mg by mouth 3 (three) times daily as needed.      . fish oil-omega-3 fatty acids 1000 MG capsule Take 2 capsules (2 g total) by mouth daily.      Marland Kitchen glucose blood test strip Check blood sugar once daily before breakfast or if you are feeling poorly. DX code: 250.00  100 each  12  . glyBURIDE (DIABETA) 5 MG tablet Take 1 tablet (5 mg total) by mouth 2 (two) times daily with a meal.  180 tablet  1  . ibuprofen (ADVIL) 200 MG tablet Take 2 tablets (400 mg total) by mouth every 6 (six) hours as needed for pain (WITH FOOD).  100 tablet  2  . lisinopril (PRINIVIL,ZESTRIL) 20 MG tablet Take 1 tablet (20 mg total) by mouth daily.  90 tablet  1  . metFORMIN (GLUCOPHAGE) 1000 MG tablet Take 1 tablet (1,000 mg total) by mouth 2 (two) times daily with a meal.  180 tablet  1  . ondansetron (ZOFRAN) 4 MG tablet  Take 1 tablet (4 mg total) by mouth every 12 (twelve) hours as needed for nausea.  30 tablet  1  . oxyCODONE-acetaminophen (ROXICET) 5-325 MG per tablet Take 1 tablet by mouth every 8 (eight) hours as needed for pain.  21 tablet  0   Family History  Problem Relation Age of Onset  . CAD Mother 46  . Diabetes Mother    History   Social History  . Marital Status: Divorced    Spouse Name: N/A    Number of Children: 2  . Years of Education: 10th grade   Occupational History  . 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 Smoking status: Current Every Day Smoker -- 0.8 packs/day for 20 years    Types: Cigarettes    Last Attempt to Quit: 11/23/2011  . Smokeless tobacco: Not  on file  . Alcohol Use: No  . Drug Use: No  . Sexually Active: Not on file   Other Topics Concern  . Not on file   Social History Narrative   Lives at [place] alone   Review of Systems: General: no fevers, chills. Decreased appetite due to nausea Skin: no rash HEENT: no blurry vision, hearing changes or sore throat. 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 Pulm: no dyspnea, coughing, wheezing CV: no chest pain, palpitations, shortness of breath Abd: has nausea and abdominal pain and constipation, No vomiting.  GU: no dysuria, hematuria, polyuria Ext: has knee pain on the left side Neuro: no weakness, numbness, or tingling  Objective:  Physical Exam: There were no vitals filed for this visit. General: Not in acute distress HEENT: PERRL, EOMI, no scleral icterus, No bruit or JVD. 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 Cardiac: S1/S2, RRR, No murmurs, gallops or rubs Pulm: Good air movement bilaterally, Clear to auscultation bilaterally, No rales, wheezing, rhonchi or rubs. Abd: Soft,  Non-distended. Tender over epigastric area, RUQ, LMQ and LLQ, no rebound pain, no organomegaly, BS present. No CVA tenderness. Ext: No rashes or edema, 2+DP/PT pulse bilaterally Musculoskeletal: No joint deformities, erythema. Left knee is TTP. Skin: no rashes. No skin bruise. Neuro: Alert and oriented X3, cranial nerves II-XII grossly intact, muscle strength 5/5 in all extremeties,  sensation to light touch intact. Brachial reflexes 1+ bilaterally. Knee reflexes 2+ bilaterally. Babinski's sign negative. Psych: patient is not psychotic, no suicidal or hemocidal ideation.   Assessment & Plan:  Addendum on 02/01/13:  I called patient and informed her about the PET scan results. I told her that her PET scan is positive for possible malignancy, but is not 100% sure. She will be given referral back to pulmonologist for possible biopsy. She will be called for appointment with pulmonologist when schedule is made. She  verbally understood and had no further questions.  [name], MD PGY2, Internal Medicine Teaching Service Pager: [phone]   Addendum: 01/30/13  Patient was recently accidentally found to have a left lower lobe lung nodule, 16 mm in size. She was given referral to pulmonologist who asked for PET scan. Today PET scan was done. The results showed:  1.  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  2.  Nodule along either the low left mediastinum or posterior left pericardium is hypermetabolic and suspicious for nodal metastasis. Less likely, a synchronous primary pulmonary lesion could look Similar. 3.  No evidence of extrathoracic disease.  Plan -Will give patient referral  back to pulmonologist for further evaluation and treatment.  -Will call patient to inform her the results.

## 2012-12-14 NOTE — Assessment & Plan Note (Signed)
Patient was accidentally found to have a left lower lobe lung nodule, 16 mm in size. There is no previous image to compare with. Patient denies chest pain, cough, hemoptysis or shortness of breath. Her lung auscultation is clear bilaterally. Patient  Intentionally lost weight by7 kg since 11/10/12, which she attributes to her recent nausea, abdominal pain and decreased oral intake. Patient is a long-time smoker. She smokes half pack a day for more than 20 years, which increased her risk of lung cancer. According to the guideline, high risk patient with lung nodule >=8 mm should consider to get PET or biopsy.  -will give patient referral to pulmonologist for further evaluation.

## 2012-12-14 NOTE — Assessment & Plan Note (Signed)
Etiology for her abdominal pain is not clear. Negative CT abdomen ruling out some possibilities, such as kidney stone, gallstone,  diverticulitis, acute on chronic pancreatitis. The other differential diagnoses include gastritis and ischemic mesenteric arteries. The constipation may also contribute partially (patient had abdominal pain before she constipated). I discussed with Dr. Dalphine Handing, we decide to have plans as follows:  -Discontinue ibuprofen and start Protonix, continue pain medication. -Check Helicobacter pylori and FOBT -Treat constipation with Miralax -if all tests not revealing, will consider to work up for ischemic mesenteric artery disease.

## 2012-12-14 NOTE — Patient Instructions (Signed)
1. Please take Protonix 40 mg daily for your abdominal pain. Please take MiraLax for your constipation. She stopped taking ibuprofen which may have caused your abdominal pain.  2. we will give your referral to pulmonologist for further evaluation of your lung mass. 3. If you have worsening of your symptoms or new symptoms arise, please call the clinic ([phone]), or go to the ER immediately if symptoms are severe.

## 2012-12-21 ENCOUNTER — Other Ambulatory Visit: Payer: Self-pay | Admitting: Internal Medicine

## 2012-12-21 DIAGNOSIS — J449 Chronic obstructive pulmonary disease, unspecified: Secondary | ICD-10-CM

## 2012-12-27 ENCOUNTER — Encounter (HOSPITAL_COMMUNITY): Payer: No Typology Code available for payment source

## 2013-01-02 ENCOUNTER — Other Ambulatory Visit: Payer: Self-pay | Admitting: Internal Medicine

## 2013-01-03 ENCOUNTER — Other Ambulatory Visit: Payer: Self-pay | Admitting: Internal Medicine

## 2013-01-03 DIAGNOSIS — R911 Solitary pulmonary nodule: Secondary | ICD-10-CM

## 2013-01-09 ENCOUNTER — Ambulatory Visit (HOSPITAL_COMMUNITY)
Admission: RE | Admit: 2013-01-09 | Discharge: 2013-01-09 | Disposition: A | Discharge status: Home / Self Care | Payer: No Typology Code available for payment source | Source: Ambulatory Visit | Attending: Internal Medicine | Admitting: Internal Medicine

## 2013-01-09 DIAGNOSIS — J449 Chronic obstructive pulmonary disease, unspecified: Secondary | ICD-10-CM | POA: Insufficient documentation

## 2013-01-09 DIAGNOSIS — J4489 Other specified chronic obstructive pulmonary disease: Secondary | ICD-10-CM | POA: Insufficient documentation

## 2013-01-09 MED ORDER — ALBUTEROL SULFATE (5 MG/ML) 0.5% IN NEBU
2.5000 mg | INHALATION_SOLUTION | Freq: Once | RESPIRATORY_TRACT | Status: AC
  Administered 2013-01-09: 2.5 mg via RESPIRATORY_TRACT

## 2013-01-25 ENCOUNTER — Ambulatory Visit (HOSPITAL_COMMUNITY)
Admission: RE | Admit: 2013-01-25 | Discharge: 2013-01-25 | Disposition: A | Discharge status: Home / Self Care | Payer: No Typology Code available for payment source | Source: Ambulatory Visit | Attending: Internal Medicine | Admitting: Internal Medicine

## 2013-01-25 DIAGNOSIS — I2510 Atherosclerotic heart disease of native coronary artery without angina pectoris: Secondary | ICD-10-CM | POA: Insufficient documentation

## 2013-01-25 DIAGNOSIS — C3490 Malignant neoplasm of unspecified part of unspecified bronchus or lung: Secondary | ICD-10-CM | POA: Insufficient documentation

## 2013-01-25 DIAGNOSIS — R911 Solitary pulmonary nodule: Secondary | ICD-10-CM | POA: Insufficient documentation

## 2013-01-25 LAB — GLUCOSE, CAPILLARY: Glucose-Capillary: 397 mg/dL — ABNORMAL HIGH (ref 70–99)

## 2013-01-30 ENCOUNTER — Other Ambulatory Visit: Payer: Self-pay | Admitting: Internal Medicine

## 2013-01-30 ENCOUNTER — Encounter (HOSPITAL_COMMUNITY)
Admission: RE | Admit: 2013-01-30 | Discharge: 2013-01-30 | Disposition: A | Discharge status: Home / Self Care | Payer: No Typology Code available for payment source | Source: Ambulatory Visit | Attending: Internal Medicine | Admitting: Internal Medicine

## 2013-01-30 DIAGNOSIS — C771 Secondary and unspecified malignant neoplasm of intrathoracic lymph nodes: Secondary | ICD-10-CM | POA: Insufficient documentation

## 2013-01-30 DIAGNOSIS — R911 Solitary pulmonary nodule: Secondary | ICD-10-CM | POA: Insufficient documentation

## 2013-01-30 DIAGNOSIS — C50919 Malignant neoplasm of unspecified site of unspecified female breast: Secondary | ICD-10-CM | POA: Insufficient documentation

## 2013-01-30 DIAGNOSIS — I2510 Atherosclerotic heart disease of native coronary artery without angina pectoris: Secondary | ICD-10-CM | POA: Insufficient documentation

## 2013-01-30 MED ORDER — FLUDEOXYGLUCOSE F - 18 (FDG) INJECTION
18.8000 | Freq: Once | INTRAVENOUS | Status: AC | PRN
  Administered 2013-01-30: 18.8 via INTRAVENOUS

## 2013-02-01 ENCOUNTER — Other Ambulatory Visit: Payer: Self-pay | Admitting: *Deleted

## 2013-02-01 ENCOUNTER — Telehealth: Payer: Self-pay | Admitting: Internal Medicine

## 2013-02-01 NOTE — Telephone Encounter (Signed)
Pt called for refill but has 6 months on meds she needs.

## 2013-02-01 NOTE — Telephone Encounter (Signed)
  I called patient and informed her about the PET scan results. I told her that her PET scan is positive for possible malignancy, but is not 100% sure. She will be given referral back to pulmonologist for possible biopsy. She will be called for appointment with pulmonologist when schedule is made. She verbally understood and had no further questions.    [name], MD  PGY2, Internal Medicine Teaching Service  Pager: [phone]

## 2013-02-06 ENCOUNTER — Ambulatory Visit: Payer: No Typology Code available for payment source | Admitting: Internal Medicine

## 2013-02-06 ENCOUNTER — Encounter: Payer: Self-pay | Admitting: Internal Medicine

## 2013-02-08 ENCOUNTER — Institutional Professional Consult (permissible substitution) (INDEPENDENT_AMBULATORY_CARE_PROVIDER_SITE_OTHER): Payer: No Typology Code available for payment source | Admitting: Cardiothoracic Surgery

## 2013-02-08 ENCOUNTER — Encounter: Payer: Self-pay | Admitting: Cardiothoracic Surgery

## 2013-02-08 ENCOUNTER — Ambulatory Visit: Payer: No Typology Code available for payment source | Admitting: Internal Medicine

## 2013-02-08 VITALS — BP 128/73 | HR 86 | Temp 99.0°F | Resp 16 | Ht 65.0 in | Wt 184.0 lb

## 2013-02-08 DIAGNOSIS — R599 Enlarged lymph nodes, unspecified: Secondary | ICD-10-CM

## 2013-02-08 DIAGNOSIS — R911 Solitary pulmonary nodule: Secondary | ICD-10-CM

## 2013-02-09 ENCOUNTER — Other Ambulatory Visit: Payer: Self-pay | Admitting: *Deleted

## 2013-02-09 ENCOUNTER — Encounter (HOSPITAL_COMMUNITY): Payer: Self-pay | Admitting: Pharmacy Technician

## 2013-02-09 ENCOUNTER — Encounter (HOSPITAL_COMMUNITY): Payer: Self-pay | Admitting: *Deleted

## 2013-02-09 DIAGNOSIS — R918 Other nonspecific abnormal finding of lung field: Secondary | ICD-10-CM

## 2013-02-09 NOTE — Progress Notes (Signed)
Notified Darius Bump Rn about recent UTI and not being treated for that--will let Dr.Gerhardt know

## 2013-02-09 NOTE — Progress Notes (Signed)
Pt doesn't have a cardiologist  Denies ever having a heart cath/echo/stress test  Medical Md is with [facility]  Denies EKG in past year  CXR DOS

## 2013-02-09 NOTE — Progress Notes (Signed)
02/09/13 1239  OBSTRUCTIVE SLEEP APNEA  Have you ever been diagnosed with sleep apnea through a sleep study? No  Do you snore loudly (loud enough to be heard through closed doors)?  1  Do you often feel tired, fatigued, or sleepy during the daytime? 0  Has anyone observed you stop breathing during your sleep? 1  Do you have, or are you being treated for high blood pressure? 1  BMI more than 35 kg/m2? 0  Age over 56 years old? 1  Neck circumference greater than 40 cm/18 inches? 0  Gender: 0  Obstructive Sleep Apnea Score 4  Score 4 or greater  Results sent to PCP

## 2013-02-10 NOTE — Patient Instructions (Signed)
Flexible Bronchoscopy Bronchoscopy is a procedure for diagnosing problems in the lungs. It allows your caregiver to examine the passageways in the lungs by direct exam. This is accomplished by the use of a flexible telescope-like tool (flexible fiberoptic bronchoscope), which is passed down to the air passageways to be examined.  LET YOUR CAREGIVER KNOW ABOUT:   Allergies to food or medicine.  Medicines taken, including vitamins, herbs, eyedrops, over-the-counter medicines, and creams.  Use of steroids (by mouth or creams).  Previous problems with anesthetics or numbing medicines.  History of bleeding problems or blood clots.  Previous surgery.  Other health problems, including diabetes and kidney problems.  Possibility of pregnancy, if this applies. BEFORE THE PROCEDURE   Do not eat or drink anything 8 hours before the test.  Medications may be given to relax you, dry up your secretions, and to control coughing. The medication which dries up secretions will make your mouth very dry.  Local anesthetics (medications) are given to numb your mouth, nose, throat and voice box (larynx). PROCEDURE   Relax as much as possible during the procedure.  Follow the caregiver's instructions to speed up the procedure.  Your breathing is not blocked (obstructed) during this procedure. You will be able to breath normally during the procedure.  If tissue samples (biopsies) are needed in the outer portions of the lung, sometimes a special type of X-ray (fluoroscopy) is required.  Abnormal areas will be brushed or biopsied for exam under a microscope.  If any bleeding occurs from these areas, a drug may be used to make the blood vessels smaller (constrict) to stop or decrease the bleeding.  You may receive a chest X-ray following the procedure to make sure the lungs have not collapsed (pneumothorax). AFTER THE PROCEDURE   You may resume normal activities.  Call the caregiver or return for an  appointment as directed if biopsies were taken.  Do not eat or drink anything until cough and gag reflexes have returned. There is danger of burning yourself or getting food or water into your lungs when your mouth and airways are numb. After the numbness is gone, you may begin taking a normal diet. SEEK IMMEDIATE MEDICAL CARE IF:   You become lightheaded.  You get short of breath.  You become faint.  You develop chest pain.  You cough up blood. Document Released: 11/05/2000 Document Revised: 01/31/2012 Document Reviewed: 07/28/2011 [facility] Patient Information 2013 [place], [place]. Lung Cancer Lung cancer is a tumor which starts as a growth in your lungs. Cancer is a group of many related diseases that begin in cells, the building blocks of the body. Normally, cells grow and divide to produce more cells only when the body needs them. Sometimes cells keep dividing when new cells are not needed. These extra cells may form a mass of tissue called a growth or tumor. Tumors can be either benign (not cancerous) or malignant (cancerous). Cancer can begin in any organ or tissue of the body. The original tumor (where the tumor started out) is called the primary cancer and is usually named for where it begins.  Lung cancer is the most common cause of cancer death in men and women. There are several different types of lung cancers. Usually, lung cancer is described as either small-cell lung cancer or non-small-cell lung cancer. Other types of cancer occur in the lungs, including carcinoid and cancers spread from other organs. The types of cancer have different behavior and treatment. CAUSES  This cancer usually starts  when the lungs are exposed to harmful chemicals. When you quit smoking, your risk of lung cancer falls each year (but is never the same as a person who has never smoked).  Other risks include:   Radon gas exposure.  Asbestos and other industrial substance exposure.  Second hand tobacco  smoke.  Air pollution.  Family or personal history of lung cancer.  Age over 24. SYMPTOMS  Lung cancer can cause many symptoms. They depend on the type of cancer, its location and other factors. Symptoms of lung cancer can include:  Cough (either new, different or more severe).  Shortness of breath.  Coughing up blood (hemoptysis).  Chest pain.  Hoarseness.  Swelling of the face.  Drooping eyelid.  Changes in blood tests: low sodium (hyponatremia), high calcium (hypercalcemia) or low blood count (anemia).  Weight loss. In its early stages, lung cancer may not have symptoms and can be discovered by accident. Many of the symptoms above can be caused by diseases other than lung cancer. DIAGNOSIS  In early lung cancer, the patient often does not notice problems. It usually has spread by the time problems are first noticed. Your caregiver may suspect lung cancer based on your symptoms, your exam or based on tests (such as x-rays) obtained for other reasons. Common tests that help your caregiver diagnose your condition include:  Chest x-ray.  CT scan of the lungs and chest.  Blood tests. If a tumor is found, a biopsy will be necessary to confirm that cancer is present and to determine the type of cancer. TREATMENT   Surgery offers a hope for a cure if the cancer has not spread and the cancer is not a small cell (oat cell) cancer of the lung. Surgery cannot cure the small cell type of cancer.  Radiation Therapy is a form of high energy X-ray that helps slow or kill the cancer. It is often used along with medications (chemotherapy) to help treat the cancer and control pain.  Chemotherapy is used in combination with surgery in advanced cancer. It is also used in all small cell cancers.  Many new treatments look promising.  Your caregiver can give you more information and discuss treatment options that are best for your type of cancer. HOME CARE INSTRUCTIONS   If you smoke,  stop!  Take all medications as told.  Keep all appointments with your caregiver and other specialists.  Ask your caregiver if you should see a cancer specialist, if that has not been arranged.  If you require oxygen or breathing equipment, be sure you know how to use it and who to call with questions.  Follow any special diet directions. If you have problems with appetite, ask your caregiver for help. SEEK MEDICAL CARE IF:   You have had a surgical procedure are you are having trouble recovering.  You have ongoing weight loss.  You have decreased strength or energy past the point when your caregiver said you would feel better.  You develop nausea or lightheadedness.  You have pain that is not improving. SEEK IMMEDIATE MEDICAL CARE IF:   You cough up clotted blood or bright red blood.  Your pain is uncontrolled.  You develop new difficulty breathing or chest pain.  You develop swelling in one or both ankles or legs, or swelling in your face or neck.  You develop new headache or confusion. Document Released: 02/14/2001 Document Revised: 01/31/2012 Document Reviewed: 11/25/2008 [facility] Patient Information 2013 [place], [place].

## 2013-02-10 NOTE — Progress Notes (Signed)
[address]            [place] [address]          [phone]      Anita Gardner [facility] Medical Record #[id number] Date of Birth: September 12, 1956  Referring: [name], MD Primary Care: [name], DO  Chief Complaint:    Chief Complaint  Patient presents with  . Lung Lesion    left lower lobe..eval and treat...CT CHEST/ABD/PELVIS, PET, PFT'S  . Adenopathy    hilar and mediastinal    History of Present Illness:    Patient is 57 yo female  Current smoker who was complaining if abdominal discomfort and constipation. A ct scan of the abdomen was done Nov 29 2012, demonstrating a lung mass. PET scan was done 01/30/2013 and patient referred to Thoracic surgery. Patient denies hemoptysis, does note exertional SOB.      Current Activity/ Functional Status:  Patient is independent with mobility/ambulation, transfers, ADL's, IADL's.  Zubrod Score: At the time of surgery this patient's most appropriate activity status/level should be described as: []  Normal activity, no symptoms [x]  Symptoms, fully ambulatory []  Symptoms, in bed less than or equal to 50% of the time []  Symptoms, in bed greater than 50% of the time but less than 100% []  Bedridden []  Moribund   Past Medical History  Diagnosis Date  . Chronic pain of left knee   . Hypertension     takes Lisinopril daily  . Cancer     lung mass  . Headache     occasionally  . Peripheral neuropathy   . Arthritis     left knee  . GERD (gastroesophageal reflux disease)     but doesn't take any meds at present  . Urinary tract infection     a little over a week ago but no meds were given  . Diabetes mellitus type 2, uncontrolled, without complications DX: 2000    Metformin and Glyburide daily  . Constipation     takes Miralax daily  . Anxiety   . Depression     but doesn't take any meds  . Panic attacks   . Bipolar 1 disorder   . COPD (chronic obstructive pulmonary  disease)   . Cough   . Shortness of breath     with exertion    Past Surgical History  Procedure Laterality Date  . Left leg surgery      as a child    Family History  Problem Relation Age of Onset  . CAD Mother 52  . Diabetes Mother   history of father unknown  History   Social History  . Marital Status: Divorced    Spouse Name: N/A    Number of Children: 2  . Years of Education: 10th grade   Occupational History  . 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 Smoking status: Current Every Day Smoker -- 1.00 packs/day for 40 years    Types: Cigarettes  . Smokeless tobacco: Never Used  . Alcohol Use: No  . Drug Use: No  . Sexually Active: Not Currently   Other Topics Concern  . Not on file   Social History Narrative   Lives at [place] alone    History  Smoking status  . Current Every Day Smoker -- 1.00 packs/day for  40 years  . Types: Cigarettes  Smokeless tobacco  . Never Used    History  Alcohol Use No     Allergies  Allergen Reactions  . Ampicillin Hives  . Codeine Nausea And Vomiting         Current Outpatient Prescriptions  Medication Sig Dispense Refill  . Blood Glucose Monitoring Suppl (AGAMATRIX PRESTO) W/DEVICE KIT 1 Device by Does not apply route once.  1 kit  0  . glucose blood test strip Check blood sugar once daily before breakfast or if you are feeling poorly. DX code: 250.00  100 each  12  . glyBURIDE (DIABETA) 5 MG tablet Take 1 tablet (5 mg total) by mouth 2 (two) times daily with a meal.  180 tablet  1  . lisinopril (PRINIVIL,ZESTRIL) 20 MG tablet Take 1 tablet (20 mg total) by mouth daily.  90 tablet  1  . metFORMIN (GLUCOPHAGE) 1000 MG tablet Take 1 tablet (1,000 mg total) by mouth 2 (two) times daily with a meal.  180 tablet  1  . Cholecalciferol (VITAMIN D-3) 5000 UNITS TABS Take 5,000 Units by mouth daily.      . miconazole (MONISTAT 7) 2 % vaginal cream Place 1  applicator vaginally at bedtime. 7 day course started 02/06/2013      . Multiple Vitamin (MULTIVITAMIN WITH MINERALS) TABS Take 1 tablet by mouth daily.      . Omega-3 Fatty Acids (FISH OIL) 1200 MG CAPS Take 1,200 mg by mouth daily.      . solifenacin (VESICARE) 5 MG tablet Take 10 mg by mouth daily.       No current facility-administered medications for this visit.       Review of Systems:     Cardiac Review of Systems: Y or N  Chest Pain [   n ]  Resting SOB [ n  ] Exertional SOB  Cove.Etienne  ]  Orthopnea [ n ]   Pedal Edema Cove.Etienne   ]    Palpitations [ n ] Syncope  [n  ]   Presyncope [  n ]  General Review of Systems: [Y] = yes [  ]=no Constitional: recent weight change [ n ]; anorexia [  ]; fatigue Cove.Etienne  ]; nausea [  n]; night sweats n]; fever [  ]; or chills [  ];                                                                                                                                          Dental: poor dentition[ y ]; Last Dentist visit:   Eye : blurred vision [  ]; diplopia [   ]; vision changes [  ];  Amaurosis fugax[  ];blind in rt eye Resp: cough [  ];  wheezing[y  ];  hemoptysis[ n ]; shortness of breath[y  ]; paroxysmal nocturnal dyspnea[  y]; dyspnea on exertion[  y ]; or orthopnea[  ];  GI:  gallstones[  ], vomiting[  ];  dysphagia[  ]; melena[  ];  hematochezia [  ]; heartburn[  ];   Hx of  Colonoscopy[n  ]; GU: kidney stones [  ]; hematuria[  ];   dysuria [  ];  nocturia[  ];  history of     obstruction [  ]; urinary frequency [  ]             Skin: rash, swelling[  ];, hair loss[  ];  peripheral edema[  ];  or itching[  ]; Musculosketetal: myalgias[  ];  joint swelling[ y ];  joint erythema[  ];  joint pain[  ];  back pain[  ];  Heme/Lymph: bruising[  ];  bleeding[  ];  anemia[  ];  Neuro: TIA[  ];  headaches[  ];  stroke[  ];  vertigo[  ];  seizures[  ];   paresthesias[  ];  difficulty walking[  ];  Psych:depression[  ]; anxiety[  ];  Endocrine: diabetes[ y ];  thyroid  dysfunction[  ];  Immunizations: Flu [no  ]; Pneumococcal[  no];  Other:  Physical Exam: BP 128/73  Pulse 86  Temp(Src) 99 F (37.2 C) (Oral)  Resp 16  Ht 5\' 5"  (1.651 m)  Wt 184 lb (83.462 kg)  BMI 30.62 kg/m2  SpO2 98%  General appearance: alert, cooperative, appears older than stated age and moderately obese Neurologic: intact Heart: regular rate and rhythm, S1, S2 normal, no murmur, click, rub or gallop Lungs: clear to auscultation bilaterally and normal percussion bilaterally Abdomen: soft, non-tender; bowel sounds normal; no masses,  no organomegaly Extremities: extremities normal, atraumatic, no cyanosis , edema bilaterial pedal edema and Homans sign is negative, no sign of DVT No cervical or axillary adenoathy  Diagnostic Studies & Laboratory data:     Recent Radiology Findings:   Nm Pet Image Initial (pi) Skull Base To Thigh  01/30/2013  *RADIOLOGY REPORT*  Clinical Data: Initial treatment strategy for left lower lobe lung nodule.  Smoker.  NUCLEAR MEDICINE PET SKULL BASE TO THIGH  Fasting Blood Glucose:  389  Technique:  18.8 mCi F-18 FDG was injected intravenously. CT data was obtained and used for attenuation correction and anatomic localization only.  (This was not acquired as a diagnostic CT examination.) Additional exam technical data entered on technologist worksheet.  Comparison:  Abdominal pelvic CT 12/06/2012  Findings:  Neck: No abnormal hypermetabolism.  Chest:  Hypermetabolism corresponding to the left lower lobe pulmonary nodule.  This measures 2.6 x 2.7 cm on image 108/series 2.  Enlarged from 1.6 x 1.4 cm on 12/06/2012.  Hypermetabolic, a S.U.V. max of 7.8.  Subcarinal hypermetabolic node which measures 1.7 cm and a S.U.V. max of 6.5. Left sided mediastinal node measures 1.7 cm a S.U.V. max of 4.8 on image 94/series 2. Left infrahilar nodal metastasis which measures 1.2 cm and a S.U.V. max of 6.7 on image 94/series 2.On the inferior aspect of the left sided  mediastinum, versus arising from the pericardium, is a 1.8 x 1.7 cm hypermetabolic nodule which measuresa S.U.V. max of 4.8 on image 110/series 2.  Abdomen/Pelvis:  No abnormal hypermetabolism.  Skeleton:  No abnormal osseous metabolism.  CT  images performed for attenuation correction demonstrate mild cerebral atrophy for age.  Sub centimeter left thyroid nodule which is nonspecific.Multivessel coronary artery atherosclerosis.  Mild left adrenal thickening, without hypermetabolism.  Foci of focal steatosis suspected in posterior aspect of segment  4 of the liver.  Example image 131/series 2.  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  IMPRESSION:  1.  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 2.  Nodule along either the low left mediastinum or posterior left pericardium is hypermetabolic and suspicious for nodal metastasis. Less likely, a synchronous primary pulmonary lesion could look similar. 3.  No evidence of extrathoracic disease. 4. Age advanced coronary artery atherosclerosis.  Recommend assessment of coronary risk factors and consideration of medical therapy.   Original Report Authenticated By: Jeronimo Greaves, M.D.      Recent Lab Findings: Lab Results  Component Value Date   WBC 11.0* 11/29/2012   HGB 15.8* 11/29/2012   HCT 43.3 11/29/2012   PLT 367 11/29/2012   GLUCOSE 280* 11/29/2012   CHOL 201* 11/10/2012   TRIG 281* 11/10/2012   HDL 35* 11/10/2012   LDLCALC 110* 11/10/2012   ALT 20 11/10/2012   AST 16 11/10/2012   NA 127* 11/29/2012   K 4.2 11/29/2012   CL 91* 11/29/2012   CREATININE 0.75 11/29/2012   BUN 7 11/29/2012   CO2 24 11/29/2012   TSH 1.327 11/10/2012   HGBA1C 10.2* 11/10/2012      Assessment / Plan:     1.  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  Nodule along either the  low left mediastinum or posterior left pericardium is hypermetabolic and suspicious for nodal metastasis.    3  Age advanced coronary artery atherosclerosis on incidental CT but with out clinical history or symptoms.   Bronchoscopy with EBUS has been recommended to patient to get tissue dx and confirm staging information. The risks and options of DX and the prob dx of Lung cancer has been discussed with the patient. Plan for March 24.    Delight Ovens MD  Beeper [phone] Office [phone] 02/10/2013 6:11 PM

## 2013-02-12 ENCOUNTER — Ambulatory Visit (HOSPITAL_COMMUNITY)
Admission: RE | Admit: 2013-02-12 | Discharge: 2013-02-12 | Disposition: A | Discharge status: Home / Self Care | Payer: No Typology Code available for payment source | Source: Ambulatory Visit | Attending: Cardiothoracic Surgery | Admitting: Cardiothoracic Surgery

## 2013-02-12 ENCOUNTER — Other Ambulatory Visit: Payer: Self-pay | Admitting: *Deleted

## 2013-02-12 ENCOUNTER — Encounter (HOSPITAL_COMMUNITY)
Admission: RE | Disposition: A | Discharge status: Home / Self Care | Payer: Self-pay | Source: Ambulatory Visit | Attending: Cardiothoracic Surgery

## 2013-02-12 HISTORY — DX: Constipation, unspecified: K59.00

## 2013-02-12 HISTORY — DX: Chronic obstructive pulmonary disease, unspecified: J44.9

## 2013-02-12 HISTORY — DX: Major depressive disorder, single episode, unspecified: F32.9

## 2013-02-12 HISTORY — DX: Panic disorder (episodic paroxysmal anxiety): F41.0

## 2013-02-12 HISTORY — DX: Anxiety disorder, unspecified: F41.9

## 2013-02-12 HISTORY — DX: Depression, unspecified: F32.A

## 2013-02-12 HISTORY — DX: Bipolar disorder, unspecified: F31.9

## 2013-02-12 HISTORY — DX: Gastro-esophageal reflux disease without esophagitis: K21.9

## 2013-02-12 SURGERY — BRONCHOSCOPY, WITH EBUS
Anesthesia: General

## 2013-02-12 NOTE — Progress Notes (Signed)
Patient came into hospital drinking water and stated "they didn't tell me I couldn't have something to drink only not to eat anything". Patient was informed by secretary that her surgery time was changed to 0900 and we would put her in a room as soon as one of the first patients left.  I called Dr. Chaney Malling to let him know about patient drinking 10 oz water finishing around 0530 and he stated her case could start at 0930. I explained this to patient and she stated she could not wait that long. I informed her that they would come to get her about 1 hour before 0930 and she still said she could not wait that long, and she said she would call the office to reschedule.  Call to Dr. Cornelius Moras and he stated that I should call the office when they open.

## 2013-02-13 ENCOUNTER — Other Ambulatory Visit: Payer: Self-pay

## 2013-02-13 DIAGNOSIS — R51 Headache: Secondary | ICD-10-CM

## 2013-02-14 ENCOUNTER — Ambulatory Visit (HOSPITAL_COMMUNITY): Payer: No Typology Code available for payment source

## 2013-02-14 ENCOUNTER — Encounter: Payer: Self-pay | Admitting: *Deleted

## 2013-02-14 ENCOUNTER — Encounter (HOSPITAL_COMMUNITY): Payer: Self-pay | Admitting: Anesthesiology

## 2013-02-14 ENCOUNTER — Ambulatory Visit (HOSPITAL_COMMUNITY): Payer: No Typology Code available for payment source | Admitting: Anesthesiology

## 2013-02-14 ENCOUNTER — Ambulatory Visit (HOSPITAL_COMMUNITY)
Admission: RE | Admit: 2013-02-14 | Discharge: 2013-02-14 | Disposition: A | Discharge status: Home / Self Care | Payer: No Typology Code available for payment source | Source: Ambulatory Visit | Attending: Cardiothoracic Surgery | Admitting: Cardiothoracic Surgery

## 2013-02-14 ENCOUNTER — Encounter (HOSPITAL_COMMUNITY)
Admission: RE | Disposition: A | Discharge status: Home / Self Care | Payer: Self-pay | Source: Ambulatory Visit | Attending: Cardiothoracic Surgery

## 2013-02-14 DIAGNOSIS — Z880 Allergy status to penicillin: Secondary | ICD-10-CM | POA: Insufficient documentation

## 2013-02-14 DIAGNOSIS — J449 Chronic obstructive pulmonary disease, unspecified: Secondary | ICD-10-CM | POA: Insufficient documentation

## 2013-02-14 DIAGNOSIS — F17200 Nicotine dependence, unspecified, uncomplicated: Secondary | ICD-10-CM | POA: Insufficient documentation

## 2013-02-14 DIAGNOSIS — R918 Other nonspecific abnormal finding of lung field: Secondary | ICD-10-CM

## 2013-02-14 DIAGNOSIS — K219 Gastro-esophageal reflux disease without esophagitis: Secondary | ICD-10-CM | POA: Insufficient documentation

## 2013-02-14 DIAGNOSIS — D381 Neoplasm of uncertain behavior of trachea, bronchus and lung: Secondary | ICD-10-CM

## 2013-02-14 DIAGNOSIS — Z885 Allergy status to narcotic agent status: Secondary | ICD-10-CM | POA: Insufficient documentation

## 2013-02-14 DIAGNOSIS — C771 Secondary and unspecified malignant neoplasm of intrathoracic lymph nodes: Secondary | ICD-10-CM | POA: Insufficient documentation

## 2013-02-14 DIAGNOSIS — E119 Type 2 diabetes mellitus without complications: Secondary | ICD-10-CM | POA: Insufficient documentation

## 2013-02-14 DIAGNOSIS — E669 Obesity, unspecified: Secondary | ICD-10-CM | POA: Insufficient documentation

## 2013-02-14 DIAGNOSIS — F3130 Bipolar disorder, current episode depressed, mild or moderate severity, unspecified: Secondary | ICD-10-CM | POA: Insufficient documentation

## 2013-02-14 DIAGNOSIS — F411 Generalized anxiety disorder: Secondary | ICD-10-CM | POA: Insufficient documentation

## 2013-02-14 DIAGNOSIS — C3430 Malignant neoplasm of lower lobe, unspecified bronchus or lung: Secondary | ICD-10-CM | POA: Insufficient documentation

## 2013-02-14 DIAGNOSIS — F410 Panic disorder [episodic paroxysmal anxiety] without agoraphobia: Secondary | ICD-10-CM | POA: Insufficient documentation

## 2013-02-14 DIAGNOSIS — G609 Hereditary and idiopathic neuropathy, unspecified: Secondary | ICD-10-CM | POA: Insufficient documentation

## 2013-02-14 DIAGNOSIS — I10 Essential (primary) hypertension: Secondary | ICD-10-CM | POA: Insufficient documentation

## 2013-02-14 DIAGNOSIS — J4489 Other specified chronic obstructive pulmonary disease: Secondary | ICD-10-CM | POA: Insufficient documentation

## 2013-02-14 HISTORY — PX: VIDEO BRONCHOSCOPY WITH ENDOBRONCHIAL ULTRASOUND: SHX6177

## 2013-02-14 LAB — COMPREHENSIVE METABOLIC PANEL
ALT: 23 U/L (ref 0–35)
AST: 15 U/L (ref 0–37)
Albumin: 4.1 g/dL (ref 3.5–5.2)
Alkaline Phosphatase: 78 U/L (ref 39–117)
BUN: 12 mg/dL (ref 6–23)
CO2: 25 mEq/L (ref 19–32)
Calcium: 9.6 mg/dL (ref 8.4–10.5)
Chloride: 96 mEq/L (ref 96–112)
Creatinine, Ser: 0.41 mg/dL — ABNORMAL LOW (ref 0.50–1.10)
GFR calc Af Amer: >90 mL/min (ref >90)
GFR calc non Af Amer: >90 mL/min (ref >90)
Glucose, Bld: 372 mg/dL — ABNORMAL HIGH (ref 70–99)
Potassium: 4 mEq/L (ref 3.5–5.1)
Sodium: 133 mEq/L — ABNORMAL LOW (ref 135–145)
Total Bilirubin: 0.4 mg/dL (ref 0.3–1.2)
Total Protein: 7.2 g/dL (ref 6.0–8.3)

## 2013-02-14 LAB — PROTIME-INR
INR: 0.88 (ref 0.00–1.49)
Prothrombin Time: 11.9 seconds (ref 11.6–15.2)

## 2013-02-14 LAB — GLUCOSE, CAPILLARY
Glucose-Capillary: 360 mg/dL — ABNORMAL HIGH (ref 70–99)
Glucose-Capillary: 288 mg/dL — ABNORMAL HIGH (ref 70–99)

## 2013-02-14 LAB — CBC
HCT: 42 % (ref 36.0–46.0)
Hemoglobin: 15 g/dL (ref 12.0–15.0)
MCH: 32.6 pg (ref 26.0–34.0)
MCHC: 35.7 g/dL (ref 30.0–36.0)
MCV: 91.3 fL (ref 78.0–100.0)
Platelets: 246 10*3/uL (ref 150–400)
RBC: 4.6 MIL/uL (ref 3.87–5.11)
RDW: 13.3 % (ref 11.5–15.5)
WBC: 8.1 10*3/uL (ref 4.0–10.5)

## 2013-02-14 LAB — APTT: aPTT: 33 seconds (ref 24–37)

## 2013-02-14 SURGERY — BRONCHOSCOPY, WITH EBUS
Anesthesia: General | Wound class: Clean Contaminated

## 2013-02-14 MED ORDER — ONDANSETRON HCL 4 MG/2ML IJ SOLN
INTRAMUSCULAR | Status: DC | PRN
  Administered 2013-02-14: 4 mg via INTRAVENOUS

## 2013-02-14 MED ORDER — OXYCODONE HCL 5 MG PO TABS: 5.0000 mg | ORAL_TABLET | Freq: Once | ORAL | Status: DC | PRN

## 2013-02-14 MED ORDER — OXYCODONE HCL 5 MG/5ML PO SOLN
5.0000 mg | Freq: Once | ORAL | Status: DC | PRN
Start: 2013-02-14 — End: 2013-02-14

## 2013-02-14 MED ORDER — LACTATED RINGERS IV SOLN
INTRAVENOUS | Status: DC | PRN
  Administered 2013-02-14: 08:00:00 via INTRAVENOUS

## 2013-02-14 MED ORDER — INSULIN ASPART 100 UNIT/ML ~~LOC~~ SOLN
5.0000 [IU] | Freq: Once | SUBCUTANEOUS | Status: AC
  Administered 2013-02-14: 5 [IU] via SUBCUTANEOUS

## 2013-02-14 MED ORDER — PROPOFOL 10 MG/ML IV BOLUS
INTRAVENOUS | Status: DC | PRN
  Administered 2013-02-14: 130 mg via INTRAVENOUS

## 2013-02-14 MED ORDER — FENTANYL CITRATE 0.05 MG/ML IJ SOLN
INTRAMUSCULAR | Status: DC | PRN
  Administered 2013-02-14: 25 ug via INTRAVENOUS
  Administered 2013-02-14: 75 ug via INTRAVENOUS

## 2013-02-14 MED ORDER — LIDOCAINE HCL (CARDIAC) 20 MG/ML IV SOLN
INTRAVENOUS | Status: DC | PRN
  Administered 2013-02-14: 80 mg via INTRAVENOUS

## 2013-02-14 MED ORDER — HYDROMORPHONE HCL PF 1 MG/ML IJ SOLN: 0.2500 mg | INTRAMUSCULAR | Status: DC | PRN

## 2013-02-14 MED ORDER — MIDAZOLAM HCL 5 MG/5ML IJ SOLN
INTRAMUSCULAR | Status: DC | PRN
  Administered 2013-02-14: 2 mg via INTRAVENOUS

## 2013-02-14 MED ORDER — NEOSTIGMINE METHYLSULFATE 1 MG/ML IJ SOLN
INTRAMUSCULAR | Status: DC | PRN
  Administered 2013-02-14: 3 mg via INTRAVENOUS

## 2013-02-14 MED ORDER — ROCURONIUM BROMIDE 100 MG/10ML IV SOLN
INTRAVENOUS | Status: DC | PRN
  Administered 2013-02-14: 50 mg via INTRAVENOUS

## 2013-02-14 MED ORDER — GLYCOPYRROLATE 0.2 MG/ML IJ SOLN
INTRAMUSCULAR | Status: DC | PRN
  Administered 2013-02-14: 0.4 mg via INTRAVENOUS

## 2013-02-14 MED ORDER — LIDOCAINE HCL 4 % MT SOLN
OROMUCOSAL | Status: DC | PRN
  Administered 2013-02-14: 4 mL via TOPICAL

## 2013-02-14 MED ORDER — SODIUM CHLORIDE 0.9 % IV SOLN
10.0000 mg | INTRAVENOUS | Status: DC | PRN
  Administered 2013-02-14: 10 ug/min via INTRAVENOUS

## 2013-02-14 MED ORDER — 0.9 % SODIUM CHLORIDE (POUR BTL) OPTIME
TOPICAL | Status: DC | PRN
  Administered 2013-02-14: 1000 mL

## 2013-02-14 SURGICAL SUPPLY — 32 items
BALL CTTN LRG ABS STRL LF (GAUZE/BANDAGES/DRESSINGS)
BRUSH CYTOL CELLEBRITY 1.5X140 (MISCELLANEOUS) IMPLANT
CANISTER SUCTION 2500CC (MISCELLANEOUS) ×2 IMPLANT
CLOTH BEACON ORANGE TIMEOUT ST (SAFETY) ×2 IMPLANT
CONT SPEC 4OZ CLIKSEAL STRL BL (MISCELLANEOUS) ×2 IMPLANT
COTTONBALL LRG STERILE PKG (GAUZE/BANDAGES/DRESSINGS) IMPLANT
COVER TABLE BACK 60X90 (DRAPES) ×2 IMPLANT
FORCEPS BIOP RJ4 1.8 (CUTTING FORCEPS) IMPLANT
GLOVE BIO SURGEON STRL SZ 6.5 (GLOVE) ×4 IMPLANT
GLOVE BIOGEL PI IND STRL 6 (GLOVE) ×1 IMPLANT
GLOVE BIOGEL PI INDICATOR 6 (GLOVE) ×1
GOWN STRL NON-REIN LRG LVL3 (GOWN DISPOSABLE) ×2 IMPLANT
KIT ROOM TURNOVER OR (KITS) ×2 IMPLANT
MARKER SKIN DUAL TIP RULER LAB (MISCELLANEOUS) ×2 IMPLANT
NEEDLE 22X1 1/2 (OR ONLY) (NEEDLE) IMPLANT
NEEDLE BIOPSY TRANSBRONCH 21G (NEEDLE) IMPLANT
NEEDLE BLUNT 18X1 FOR OR ONLY (NEEDLE) IMPLANT
NEEDLE SYS SONOTIP II EBUSTBNA (NEEDLE) ×2 IMPLANT
NS IRRIG 1000ML POUR BTL (IV SOLUTION) ×2 IMPLANT
OIL SILICONE PENTAX (PARTS (SERVICE/REPAIRS)) ×2 IMPLANT
PAD ARMBOARD 7.5X6 YLW CONV (MISCELLANEOUS) ×4 IMPLANT
SPONGE GAUZE 4X4 12PLY (GAUZE/BANDAGES/DRESSINGS) ×2 IMPLANT
SYR 20CC LL (SYRINGE) ×2 IMPLANT
SYR 20ML ECCENTRIC (SYRINGE) ×2 IMPLANT
SYR 5ML LUER SLIP (SYRINGE) IMPLANT
SYR CONTROL 10ML LL (SYRINGE) IMPLANT
TOWEL OR 17X24 6PK STRL BLUE (TOWEL DISPOSABLE) ×2 IMPLANT
TRAP SPECIMEN MUCOUS 40CC (MISCELLANEOUS) ×2 IMPLANT
TUBE CONNECTING 12X1/4 (SUCTIONS) ×2 IMPLANT
VALVE BIOPSY  SINGLE USE (MISCELLANEOUS)
VALVE BIOPSY SINGLE USE (MISCELLANEOUS) IMPLANT
VALVE SUCTION BRONCHIO DISP (MISCELLANEOUS) IMPLANT

## 2013-02-14 NOTE — H&P (Signed)
[address]       [place] [address]             [phone]       CATHRYNE MANCEBO [facility] Medical Record #[id number] Date of Birth: 1956-01-06  Referring:  [name], MD  Primary Care: [name], DO  Chief Complaint:  Chief Complaint   Patient presents with   .  Lung Lesion     left lower lobe..eval and treat...CT CHEST/ABD/PELVIS, PET, PFT'S   .  Adenopathy     hilar and mediastinal      History of Present Illness:    Patient is 57 yo female  Current smoker who was complaining if abdominal discomfort and constipation. A ct scan of the abdomen was done Nov 29 2012, demonstrating a lung mass. PET scan was done 01/30/2013 and patient referred to Thoracic surgery. Patient denies hemoptysis, does note exertional SOB.      Current Activity/ Functional Status:  Patient is independent with mobility/ambulation, transfers, ADL's, IADL's.  Zubrod Score: At the time of surgery this patient's most appropriate activity status/level should be described as: []  Normal activity, no symptoms [x]  Symptoms, fully ambulatory []  Symptoms, in bed less than or equal to 50% of the time []  Symptoms, in bed greater than 50% of the time but less than 100% []  Bedridden []  Moribund   Past Medical History  Diagnosis Date  . Chronic pain of left knee   . Hypertension     takes Lisinopril daily  . Cancer     lung mass  . Headache     occasionally  . Peripheral neuropathy   . Arthritis     left knee  . GERD (gastroesophageal reflux disease)     but doesn't take any meds at present  . Urinary tract infection     a little over a week ago but no meds were given  . Diabetes mellitus type 2, uncontrolled, without complications DX: 2000    Metformin and Glyburide daily  . Constipation     takes Miralax daily  . Anxiety   . Depression     but doesn't take any meds  . Panic attacks   . Bipolar 1 disorder   . COPD (chronic obstructive  pulmonary disease)   . Cough   . Shortness of breath     with exertion    Past Surgical History  Procedure Laterality Date  . Left leg surgery      as a child    Family History  Problem Relation Age of Onset  . CAD Mother 71  . Diabetes Mother   history of father unknown  History   Social History  . Marital Status: Divorced    Spouse Name: N/A    Number of Children: 2  . Years of Education: 10th grade   Occupational History  . 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 Smoking status: Current Every Day Smoker -- 1.00 packs/day for 40 years    Types: Cigarettes  . Smokeless tobacco: Never Used  . Alcohol Use: No  . Drug Use: No  . Sexually Active: Not Currently   Other Topics Concern  . Not on file   Social History Narrative   Lives at [place] alone    History  Smoking status  .  Current Every Day Smoker -- 1.00 packs/day for 40 years  . Types: Cigarettes  Smokeless tobacco  . Never Used    History  Alcohol Use No     Allergies  Allergen Reactions  . Ampicillin Hives  . Codeine Nausea And Vomiting         No current facility-administered medications for this encounter.       Review of Systems:     Cardiac Review of Systems: Y or N  Chest Pain [   n ]  Resting SOB [ n  ] Exertional SOB  Cove.Etienne  ]  Orthopnea [ n ]   Pedal Edema Cove.Etienne   ]    Palpitations [ n ] Syncope  [n  ]   Presyncope [  n ]  General Review of Systems: [Y] = yes [  ]=no Constitional: recent weight change [ n ]; anorexia [  ]; fatigue Cove.Etienne  ]; nausea [  n]; night sweats n]; fever [  ]; or chills [  ];                                                                                                                                          Dental: poor dentition[ y ]; Last Dentist visit:   Eye : blurred vision [  ]; diplopia [   ]; vision changes [  ];  Amaurosis fugax[  ];blind in rt eye Resp: cough [  ];  wheezing[y  ];  hemoptysis[  n ]; shortness of breath[y  ]; paroxysmal nocturnal dyspnea[  y]; dyspnea on exertion[ y ]; or orthopnea[  ];  GI:  gallstones[  ], vomiting[  ];  dysphagia[  ]; melena[  ];  hematochezia [  ]; heartburn[  ];   Hx of  Colonoscopy[n  ]; GU: kidney stones [  ]; hematuria[  ];   dysuria [  ];  nocturia[  ];  history of     obstruction [  ]; urinary frequency [  ]             Skin: rash, swelling[  ];, hair loss[  ];  peripheral edema[  ];  or itching[  ]; Musculosketetal: myalgias[  ];  joint swelling[ y ];  joint erythema[  ];  joint pain[  ];  back pain[  ];  Heme/Lymph: bruising[  ];  bleeding[  ];  anemia[  ];  Neuro: TIA[  ];  headaches[  ];  stroke[  ];  vertigo[  ];  seizures[  ];   paresthesias[  ];  difficulty walking[  ];  Psych:depression[  ]; anxiety[  ];  Endocrine: diabetes[ y ];  thyroid dysfunction[  ];  Immunizations: Flu [no  ]; Pneumococcal[  no];  Other:  Physical Exam: There were no vitals taken for this visit.  General appearance: alert, cooperative, appears older than stated age and moderately obese  Neurologic: intact Heart: regular rate and rhythm, S1, S2 normal, no murmur, click, rub or gallop Lungs: clear to auscultation bilaterally and normal percussion bilaterally Abdomen: soft, non-tender; bowel sounds normal; no masses,  no organomegaly Extremities: extremities normal, atraumatic, no cyanosis , edema bilaterial pedal edema and Homans sign is negative, no sign of DVT No cervical or axillary adenoathy  Diagnostic Studies & Laboratory data:     Recent Radiology Findings:   Nm Pet Image Initial (pi) Skull Base To Thigh  01/30/2013  *RADIOLOGY REPORT*  Clinical Data: Initial treatment strategy for left lower lobe lung nodule.  Smoker.  NUCLEAR MEDICINE PET SKULL BASE TO THIGH  Fasting Blood Glucose:  389  Technique:  18.8 mCi F-18 FDG was injected intravenously. CT data was obtained and used for attenuation correction and anatomic localization only.  (This was not  acquired as a diagnostic CT examination.) Additional exam technical data entered on technologist worksheet.  Comparison:  Abdominal pelvic CT 12/06/2012  Findings:  Neck: No abnormal hypermetabolism.  Chest:  Hypermetabolism corresponding to the left lower lobe pulmonary nodule.  This measures 2.6 x 2.7 cm on image 108/series 2.  Enlarged from 1.6 x 1.4 cm on 12/06/2012.  Hypermetabolic, a S.U.V. max of 7.8.  Subcarinal hypermetabolic node which measures 1.7 cm and a S.U.V. max of 6.5. Left sided mediastinal node measures 1.7 cm a S.U.V. max of 4.8 on image 94/series 2. Left infrahilar nodal metastasis which measures 1.2 cm and a S.U.V. max of 6.7 on image 94/series 2.On the inferior aspect of the left sided mediastinum, versus arising from the pericardium, is a 1.8 x 1.7 cm hypermetabolic nodule which measuresa S.U.V. max of 4.8 on image 110/series 2.  Abdomen/Pelvis:  No abnormal hypermetabolism.  Skeleton:  No abnormal osseous metabolism.  CT  images performed for attenuation correction demonstrate mild cerebral atrophy for age.  Sub centimeter left thyroid nodule which is nonspecific.Multivessel coronary artery atherosclerosis.  Mild left adrenal thickening, without hypermetabolism.  Foci of focal steatosis suspected in posterior aspect of segment 4 of the liver.  Example image 131/series 2.  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  IMPRESSION:  1.  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 2.  Nodule along either the low left mediastinum or posterior left pericardium is hypermetabolic and suspicious for nodal metastasis. Less likely, a synchronous primary pulmonary lesion could look similar. 3.  No evidence of extrathoracic disease. 4. Age advanced coronary artery atherosclerosis.  Recommend assessment of coronary risk factors and consideration of medical therapy.   Original Report Authenticated By: Jeronimo Greaves,  M.D.      Recent Lab Findings: Lab Results  Component Value Date   WBC 8.1 02/14/2013   HGB 15.0 02/14/2013   HCT 42.0 02/14/2013   PLT 246 02/14/2013   GLUCOSE 372* 02/14/2013   CHOL 201* 11/10/2012   TRIG 281* 11/10/2012   HDL 35* 11/10/2012   LDLCALC 110* 11/10/2012   ALT 23 02/14/2013   AST 15 02/14/2013   NA 133* 02/14/2013   K 4.0 02/14/2013   CL 96 02/14/2013   CREATININE 0.41* 02/14/2013   BUN 12 02/14/2013   CO2 25 02/14/2013   TSH 1.327 11/10/2012   HGBA1C 10.2* 11/10/2012      Assessment / Plan:     1.  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  Nodule along either the low left mediastinum or posterior left pericardium is hypermetabolic and suspicious for nodal metastasis.    3  Age advanced coronary artery atherosclerosis on incidental CT but with out clinical history or symptoms.   Bronchoscopy with EBUS has been recommended to patient to get tissue dx and confirm staging information. The risks and options of DX and the prob dx of Lung cancer has been discussed with the patient.  The goals risks and alternatives of the planned surgical procedure Bronchoscopy EBUS have been discussed with the patient in detail. The risks of the procedure including death, infection, stroke, myocardial infarction, bleeding, blood transfusion have all been discussed specifically.  I have quoted Dustine L Bartko a 1 % of perioperative mortality and a complication rate as high as 10%. The patient's questions have been answered.Breckin L Rhines is willing  to proceed with the planned procedure.    Delight Ovens MD  Beeper [phone] Office [phone] 02/14/2013 7:40 AM

## 2013-02-14 NOTE — Anesthesia Postprocedure Evaluation (Signed)
  Anesthesia Post-op Note  Patient: Anita Gardner  Procedure(s) Performed: Procedure(s): VIDEO BRONCHOSCOPY WITH ENDOBRONCHIAL ULTRASOUND (N/A)  Patient Location: PACU  Anesthesia Type:General  Level of Consciousness: awake  Airway and Oxygen Therapy: Patient Spontanous Breathing  Post-op Pain: mild  Post-op Assessment: Post-op Vital signs reviewed  Post-op Vital Signs: stable  Complications: No apparent anesthesia complications

## 2013-02-14 NOTE — Anesthesia Preprocedure Evaluation (Addendum)
Anesthesia Evaluation  Patient identified by MRN, date of birth, ID band Patient awake    Reviewed: Allergy & Precautions, H&P , NPO status , Patient's Chart, lab work & pertinent test results  Airway Mallampati: II  Neck ROM: Full    Dental  (+) Teeth Intact and Loose   Pulmonary shortness of breath and with exertion, COPD breath sounds clear to auscultation        Cardiovascular hypertension, Rhythm:Regular Rate:Normal + Systolic murmurs    Neuro/Psych    GI/Hepatic GERD-  Controlled,  Endo/Other  diabetes, Poorly Controlled, Type 2  Renal/GU      Musculoskeletal   Abdominal (+) + obese,   Peds  Hematology   Anesthesia Other Findings   Reproductive/Obstetrics                         Anesthesia Physical Anesthesia Plan  ASA: III  Anesthesia Plan: General   Post-op Pain Management:    Induction: Intravenous  Airway Management Planned: Oral ETT  Additional Equipment:   Intra-op Plan:   Post-operative Plan:   Informed Consent: I have reviewed the patients History and Physical, chart, labs and discussed the procedure including the risks, benefits and alternatives for the proposed anesthesia with the patient or authorized representative who has indicated his/her understanding and acceptance.   Dental advisory given  Plan Discussed with: CRNA and Anesthesiologist  Anesthesia Plan Comments:         Anesthesia Quick Evaluation

## 2013-02-14 NOTE — Progress Notes (Signed)
Pt walks in and changes pharmacy to [facility] [place] [facility], scripts transferred to [facility]

## 2013-02-14 NOTE — Anesthesia Procedure Notes (Signed)
Procedure Name: Intubation Date/Time: 02/14/2013 8:38 AM Performed by: Quentin Ore Pre-anesthesia Checklist: Patient identified, Emergency Drugs available, Suction available, Patient being monitored and Timeout performed Patient Re-evaluated:Patient Re-evaluated prior to inductionOxygen Delivery Method: Circle system utilized Preoxygenation: Pre-oxygenation with 100% oxygen Intubation Type: IV induction Ventilation: Mask ventilation without difficulty Laryngoscope Size: Mac and 3 Grade View: Grade I Tube type: Oral Tube size: 8.5 mm Number of attempts: 1 Airway Equipment and Method: Stylet and LTA kit utilized Placement Confirmation: ETT inserted through vocal cords under direct vision,  positive ETCO2 and breath sounds checked- equal and bilateral Secured at: 20 cm Tube secured with: Tape Dental Injury: Teeth and Oropharynx as per pre-operative assessment

## 2013-02-14 NOTE — Preoperative (Signed)
Beta Blockers   Reason not to administer Beta Blockers:Not Applicable 

## 2013-02-14 NOTE — Op Note (Signed)
Bronchoscopy and EBUS Procedure Note  Indications: The patient is an 57 y.o. female with left lung mass and hypermetabolic mediastinal lymphnodes.  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 The risks, benefits, complications, treatment options, and expected outcomes were discussed with the patient. The possibilities of reaction to medication, pulmonary aspiration, perforation of organs, bleeding, recurrent infection, the need for additional procedures, failure to diagnose a condition, and creating a complication requiring transfusion or operation were discussed with the patient. The patient concurred with the proposed plan, giving informed consent.   The patient was taken to Operating Room # 14, identified as Anita Gardner and the procedure verified as Bronchoscopy and EBUS with biopsy. A Time Out was held and the above information confirmed.  Standard monitoring lines General anesthesia was induced. The patient underwent fiberoptic bronchoscopy through the endotracheal tube. The airways were normal to the subsegmental level in both lungs.. The scope was withdrawn. The the scope was passed and using ultrasound 2 and half to 3 cm nodes just to the left of the carina were easily identified. The transbronchial needle biopsy catheter was positioned and with ultrasound guidance up to 10-12 passes through the #7 nodes were performed. A portion of the material was placed on slides initial review of this revealed atypical cells. The remainder of the material was submitted cytolyte. The multiple passes taken grossly revealed sufficient amount of material.  The patient tolerated the procedure without difficulty scopes were removed and she was awakened in the operating  room.  Findings:  2-3 cm #7 nodes were easily identified by ultrasound  Estimated Blood Loss:  Minimal                          Specimens: Slide preps and aspiration of #7 nodes submitted to pathology                  Complications:  None; patient tolerated the procedure well.         Disposition: PACU - hemodynamically stable.         Condition: stable  Attending Attestation: I performed the procedure.

## 2013-02-14 NOTE — Progress Notes (Signed)
CBG 360.  Dr Jacklynn Bue informed and orders received and given

## 2013-02-14 NOTE — Transfer of Care (Signed)
Immediate Anesthesia Transfer of Care Note  Patient: Anita Gardner  Procedure(s) Performed: Procedure(s): VIDEO BRONCHOSCOPY WITH ENDOBRONCHIAL ULTRASOUND (N/A)  Patient Location: PACU  Anesthesia Type:General  Level of Consciousness: awake, alert  and oriented  Airway & Oxygen Therapy: Patient Spontanous Breathing and Patient connected to face mask oxygen  Post-op Assessment: Report given to PACU RN, Post -op Vital signs reviewed and stable and Patient moving all extremities X 4  Post vital signs: Reviewed and stable  Complications: No apparent anesthesia complications

## 2013-02-14 NOTE — Progress Notes (Signed)
Report give to mark rn as caregiver

## 2013-02-15 ENCOUNTER — Ambulatory Visit (HOSPITAL_COMMUNITY)
Admit: 2013-02-15 | Discharge: 2013-02-15 | Disposition: A | Discharge status: Home / Self Care | Payer: No Typology Code available for payment source | Attending: Cardiothoracic Surgery | Admitting: Cardiothoracic Surgery

## 2013-02-15 ENCOUNTER — Encounter: Payer: Self-pay | Admitting: Internal Medicine

## 2013-02-15 ENCOUNTER — Encounter: Payer: Self-pay | Admitting: *Deleted

## 2013-02-15 ENCOUNTER — Ambulatory Visit: Admission: RE | Admit: 2013-02-15 | Payer: MEDICAID | Source: Ambulatory Visit | Admitting: Radiation Oncology

## 2013-02-15 ENCOUNTER — Ambulatory Visit: Payer: No Typology Code available for payment source | Admitting: Internal Medicine

## 2013-02-15 ENCOUNTER — Ambulatory Visit: Payer: No Typology Code available for payment source | Admitting: Cardiothoracic Surgery

## 2013-02-15 DIAGNOSIS — R51 Headache: Secondary | ICD-10-CM

## 2013-02-15 MED ORDER — GADOBENATE DIMEGLUMINE 529 MG/ML IV SOLN
17.0000 mL | Freq: Once | INTRAVENOUS | Status: AC
  Administered 2013-02-15: 17 mL via INTRAVENOUS

## 2013-02-15 NOTE — Progress Notes (Signed)
No show at [facility]  Unable to reach pt by phone.  Pt mother was reached and will follow up with pt and mother for another appt

## 2013-02-16 ENCOUNTER — Other Ambulatory Visit: Payer: Self-pay | Admitting: *Deleted

## 2013-02-16 ENCOUNTER — Telehealth: Payer: Self-pay | Admitting: Internal Medicine

## 2013-02-16 ENCOUNTER — Telehealth: Payer: Self-pay | Admitting: *Deleted

## 2013-02-16 DIAGNOSIS — R911 Solitary pulmonary nodule: Secondary | ICD-10-CM

## 2013-02-16 NOTE — Telephone Encounter (Signed)
I called Anita Gardner's number with no answer.  I spoke with mother and notified of appt 02/19/13 at 2:00 labs and 2:15 FC.  She verbalized understanding of time and place of appt

## 2013-02-16 NOTE — Telephone Encounter (Signed)
PT SCHEDULED PER DANA.

## 2013-02-19 ENCOUNTER — Encounter: Payer: Self-pay | Admitting: Internal Medicine

## 2013-02-19 ENCOUNTER — Other Ambulatory Visit (HOSPITAL_BASED_OUTPATIENT_CLINIC_OR_DEPARTMENT_OTHER): Payer: No Typology Code available for payment source | Admitting: Lab

## 2013-02-19 ENCOUNTER — Telehealth: Payer: Self-pay | Admitting: Medical Oncology

## 2013-02-19 ENCOUNTER — Ambulatory Visit (HOSPITAL_BASED_OUTPATIENT_CLINIC_OR_DEPARTMENT_OTHER): Payer: No Typology Code available for payment source | Admitting: Internal Medicine

## 2013-02-19 ENCOUNTER — Ambulatory Visit: Payer: No Typology Code available for payment source

## 2013-02-19 ENCOUNTER — Telehealth: Payer: Self-pay | Admitting: Internal Medicine

## 2013-02-19 VITALS — BP 127/81 | HR 75 | Temp 99.5°F | Resp 20 | Ht 65.0 in | Wt 183.6 lb

## 2013-02-19 DIAGNOSIS — C3490 Malignant neoplasm of unspecified part of unspecified bronchus or lung: Secondary | ICD-10-CM

## 2013-02-19 DIAGNOSIS — G4700 Insomnia, unspecified: Secondary | ICD-10-CM

## 2013-02-19 DIAGNOSIS — C3492 Malignant neoplasm of unspecified part of left bronchus or lung: Secondary | ICD-10-CM

## 2013-02-19 DIAGNOSIS — R911 Solitary pulmonary nodule: Secondary | ICD-10-CM

## 2013-02-19 LAB — COMPREHENSIVE METABOLIC PANEL (CC13)
AST: 14 U/L (ref 5–34)
Albumin: 3.6 g/dL (ref 3.5–5.0)
BUN: 6.6 mg/dL — ABNORMAL LOW (ref 7.0–26.0)
Calcium: 9.9 mg/dL (ref 8.4–10.4)
Chloride: 103 mEq/L (ref 98–107)
Creatinine: 0.7 mg/dL (ref 0.6–1.1)
Glucose: 273 mg/dl — ABNORMAL HIGH (ref 70–99)

## 2013-02-19 LAB — CBC WITH DIFFERENTIAL/PLATELET
BASO%: 0.7 % (ref 0.0–2.0)
Basophils Absolute: 0.1 10*3/uL (ref 0.0–0.1)
HCT: 42.5 % (ref 34.8–46.6)
HGB: 14.5 g/dL (ref 11.6–15.9)
LYMPH%: 16.5 % (ref 14.0–49.7)
MCH: 31.9 pg (ref 25.1–34.0)
MCHC: 34.1 g/dL (ref 31.5–36.0)
MONO#: 0.4 10*3/uL (ref 0.1–0.9)
NEUT%: 76.4 % (ref 38.4–76.8)
Platelets: 249 10*3/uL (ref 145–400)
WBC: 7.6 10*3/uL (ref 3.9–10.3)

## 2013-02-19 MED ORDER — TEMAZEPAM 30 MG PO CAPS: 30.0000 mg | ORAL_CAPSULE | Freq: Every evening | ORAL | Status: DC | PRN

## 2013-02-19 MED ORDER — PROCHLORPERAZINE MALEATE 10 MG PO TABS: 10.0000 mg | ORAL_TABLET | Freq: Four times a day (QID) | ORAL | Status: DC | PRN

## 2013-02-19 NOTE — Patient Instructions (Signed)
You are recently diagnosed with small cell lung cancer questionably limited stage. We discussed systemic chemotherapy with carboplatin and etoposide concurrent with radiation.

## 2013-02-19 NOTE — Telephone Encounter (Signed)
Called in restoril -  

## 2013-02-19 NOTE — Progress Notes (Signed)
[facility] [phone]   [phone]  CONSULT NOTE  REFERRING PHYSICIAN: Dr. Sheliah Plane  REASON FOR CONSULTATION:  57 years old white female recently diagnosed with lung cancer.  HPI Anita Gardner is a 57 y.o. female was past medical history significant for hypertension, diabetes mellitus, depression, bipolar disorder, peripheral neuropathy, COPD, depression, urinary tract infection and long history of smoking. On 12/06/2012 the patient presented to her primary care physician complaining of abdominal pain for 5 weeks with constipation. She had CT scan of the abdomen and pelvis performed on 12/06/2012 and it showed no acute findings in the abdomen or pelvis but the patient had 1.6 CM left lung nodule concerning for primary bronchogenic neoplasm. She was referred to Dr. Tyrone Sage and a PET scan was performed on 01/30/2013. It showed hypermetabolism corresponding to the left lower lobe pulmonary nodule which enlarged to 2.6x2.7 CM. There was also evidence for mediastinal and left infrahilar nodal metastasis. There was a nodule along either the low left mediastinum or posterior left pericardium that is hypermetabolic and suspicious for nodal metastases. There is no evidence for extrathoracic disease. MRI of the brain was attempted but the patient was unable to complete the imaging. On 02/14/2013 the patient underwent bronchoscopy with endobronchial ultrasound and biopsies under the care of Dr. Tyrone Sage. The final cytology from the fine needle aspiration of the endoscopic lymph node 7 was positive for small cell carcinoma. Dr. Tyrone Sage kindly referred the patient to me today for evaluation and recommendation regarding treatment of her recently diagnosed with small cell lung cancer. The patient is feeling fine today with no specific complaints except for insomnia and being terrified about her recent diagnosis of lung cancer. She denied having any significant  chest pain, shortness of breath, cough or hemoptysis. She has no significant weight loss or night sweats. She denied having any headache or blurry vision. @SFHPI @  Past Medical History  Diagnosis Date  . Chronic pain of left knee     secondary to arthritis  . Hypertension   . Diabetic peripheral neuropathy   . GERD (gastroesophageal reflux disease)   . Diabetes mellitus type 2, uncontrolled, without complications DX: 2000    on oral medications only  . Constipation   . Anxiety   . Depression   . Panic attacks   . Bipolar 1 disorder   . COPD (chronic obstructive pulmonary disease)   . Bronchogenic lung cancer DX: 11/2012    Followed by Dr. Tyrone Sage ([facility]) // LLL primary bronchogenic carcinoma first noted 11/2012 on CT abdomen // PET scan 12/2012 showing mediastinal and left infrahilar nodal metastasis AND low left mediastinum or posterior left pericardium is hypermetabolic and suspicious for nodal metastasis // clinicial stage III A or B at time of diagnosis // Bronchoscopy 01/2013 for staging - results pending  . 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 Left leg surgery      as a child  . Video bronchoscopy with endobronchial ultrasound N/A 02/14/2013    Procedure: VIDEO BRONCHOSCOPY WITH ENDOBRONCHIAL ULTRASOUND;  Surgeon: Delight Ovens, MD;  Location: [facility] OR;  Service: Thoracic;  Laterality: N/A;    Family History  Problem Relation Age of Onset  . CAD Mother 46  . Diabetes Mother     Social History History  Substance Use Topics  . Smoking status: Current Every Day Smoker -- 0.50 packs/day for 40 years    Types: Cigarettes  .  Smokeless tobacco: Never Used  . Alcohol Use: No    Allergies  Allergen Reactions  . Ampicillin Hives  . Codeine Nausea And Vomiting         Current Outpatient Prescriptions  Medication Sig Dispense Refill  . Cholecalciferol (VITAMIN D-3) 5000 UNITS TABS Take 5,000 Units by mouth daily.       Marland Kitchen glyBURIDE (DIABETA) 5 MG tablet Take 1 tablet (5 mg total) by mouth 2 (two) times daily with a meal.  180 tablet  1  . lisinopril (PRINIVIL,ZESTRIL) 20 MG tablet Take 1 tablet (20 mg total) by mouth daily.  90 tablet  1  . metFORMIN (GLUCOPHAGE) 1000 MG tablet Take 1 tablet (1,000 mg total) by mouth 2 (two) times daily with a meal.  180 tablet  1  . Multiple Vitamin (MULTIVITAMIN WITH MINERALS) TABS Take 1 tablet by mouth daily.      . Omega-3 Fatty Acids (FISH OIL) 1200 MG CAPS Take 1,200 mg by mouth daily.      . solifenacin (VESICARE) 5 MG tablet Take 10 mg by mouth daily.      . temazepam (RESTORIL) 30 MG capsule Take 1 capsule (30 mg total) by mouth at bedtime as needed for sleep.  30 capsule  0   No current facility-administered medications for this visit.    Review of Systems  A comprehensive review of systems was negative except for: Constitutional: positive for fatigue Behavioral/Psych: positive for bipolar and sleep disturbance  Physical Exam  GNF:AOZHY, healthy, no distress, well nourished, well developed and anxious SKIN: skin color, texture, turgor are normal HEAD: Normocephalic, No masses, lesions, tenderness or abnormalities EYES: normal, PERRLA EARS: External ears normal OROPHARYNX:no exudate and no erythema  NECK: supple, no adenopathy LYMPH:  no palpable lymphadenopathy, no hepatosplenomegaly BREAST:not examined LUNGS: clear to auscultation  HEART: regular rate & rhythm and no murmurs ABDOMEN:abdomen soft, non-tender, obese, normal bowel sounds and no masses or organomegaly BACK: Back symmetric, no curvature. EXTREMITIES:no edema, no skin discoloration, no clubbing  NEURO: alert & oriented x 3 with fluent speech, no focal motor/sensory deficits  PERFORMANCE STATUS: ECOG 1  LABORATORY DATA: Lab Results  Component Value Date   WBC 7.6 02/19/2013   HGB 14.5 02/19/2013   HCT 42.5 02/19/2013   MCV 93.6 02/19/2013   PLT 249 02/19/2013      Chemistry        Component Value Date/Time   NA 139 02/19/2013 1359   NA 133* 02/14/2013 0643   K 4.1 02/19/2013 1359   K 4.0 02/14/2013 0643   CL 103 02/19/2013 1359   CL 96 02/14/2013 0643   CO2 24 02/19/2013 1359   CO2 25 02/14/2013 0643   BUN 6.6* 02/19/2013 1359   BUN 12 02/14/2013 0643   CREATININE 0.7 02/19/2013 1359   CREATININE 0.41* 02/14/2013 0643   CREATININE 0.75 11/29/2012 1601      Component Value Date/Time   CALCIUM 9.9 02/19/2013 1359   CALCIUM 9.6 02/14/2013 0643   ALKPHOS 76 02/19/2013 1359   ALKPHOS 78 02/14/2013 0643   AST 14 02/19/2013 1359   AST 15 02/14/2013 0643   ALT 27 02/19/2013 1359   ALT 23 02/14/2013 0643   BILITOT 0.61 02/19/2013 1359   BILITOT 0.4 02/14/2013 0643       RADIOGRAPHIC STUDIES: Dg Chest 2 View Within Previous 72 Hours.  Films Obtained On Friday Are Acceptable For Monday And Tuesday Cases  02/14/2013  *RADIOLOGY REPORT*  Clinical Data: Preoperative radiograph for bronchoscopy.  Left lung MASS surgery.  CHEST - 2 VIEW  Comparison: PET CT 01/30/2013.  Findings: Anterior left lower lobe pulmonary mass lesion is present measuring 36 mm.  This appears to abut the pleura on the frontal view.  Cardiopericardial silhouette appears within normal limits. No airspace disease.  There is a small contour abnormality in the retrocardiac region, compatible with a lymph node, also demonstrated on prior scan.  Fullness of the left hilum consistent with adenopathy.  IMPRESSION: No acute abnormality.  Redemonstration of mediastinal adenopathy and anterior left lower lobe mass.   Original Report Authenticated By: Andreas Newport, M.D.    Nm Pet Image Initial (pi) Skull Base To Thigh  01/30/2013  *RADIOLOGY REPORT*  Clinical Data: Initial treatment strategy for left lower lobe lung nodule.  Smoker.  NUCLEAR MEDICINE PET SKULL BASE TO THIGH  Fasting Blood Glucose:  389  Technique:  18.8 mCi F-18 FDG was injected intravenously. CT data was obtained and used for attenuation correction and anatomic  localization only.  (This was not acquired as a diagnostic CT examination.) Additional exam technical data entered on technologist worksheet.  Comparison:  Abdominal pelvic CT 12/06/2012  Findings:  Neck: No abnormal hypermetabolism.  Chest:  Hypermetabolism corresponding to the left lower lobe pulmonary nodule.  This measures 2.6 x 2.7 cm on image 108/series 2.  Enlarged from 1.6 x 1.4 cm on 12/06/2012.  Hypermetabolic, a S.U.V. max of 7.8.  Subcarinal hypermetabolic node which measures 1.7 cm and a S.U.V. max of 6.5. Left sided mediastinal node measures 1.7 cm a S.U.V. max of 4.8 on image 94/series 2. Left infrahilar nodal metastasis which measures 1.2 cm and a S.U.V. max of 6.7 on image 94/series 2.On the inferior aspect of the left sided mediastinum, versus arising from the pericardium, is a 1.8 x 1.7 cm hypermetabolic nodule which measuresa S.U.V. max of 4.8 on image 110/series 2.  Abdomen/Pelvis:  No abnormal hypermetabolism.  Skeleton:  No abnormal osseous metabolism.  CT  images performed for attenuation correction demonstrate mild cerebral atrophy for age.  Sub centimeter left thyroid nodule which is nonspecific.Multivessel coronary artery atherosclerosis.  Mild left adrenal thickening, without hypermetabolism.  Foci of focal steatosis suspected in posterior aspect of segment 4 of the liver.  Example image 131/series 2.  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  IMPRESSION:  1.  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 2.  Nodule along either the low left mediastinum or posterior left pericardium is hypermetabolic and suspicious for nodal metastasis. Less likely, a synchronous primary pulmonary lesion could look similar. 3.  No evidence of extrathoracic disease. 4. Age advanced coronary artery atherosclerosis.  Recommend assessment of coronary risk factors and consideration of medical therapy.   Original  Report Authenticated By: Jeronimo Greaves, M.D.     ASSESSMENT: This is a very pleasant 57 years old white female recently diagnosed with small cell lung cancer questionable for limited stage disease pending further staging workup.    PLAN: I have a lengthy discussion with the patient and her family today about her disease stage, prognosis and treatment options. I gave the patient the option of systemic chemotherapy concurrent with radiation versus palliative care and hospice. The patient is interested in proceeding with treatment. I recommended for her treatment with carboplatin for AUC of 5 on day 1 and etoposide 120 mg/M2 on days 1, 2 and 3 with Neulasta support on day 4. I would not recommend cisplatin in this  patient because of her significant diabetes mellitus and peripheral neuropathy. I discussed with the patient adverse effect of the chemotherapy including but not limited to alopecia, myelosuppression, nausea and vomiting, peripheral neuropathy, liver or renal dysfunction. I will complete the staging workup by ordering an MRI of the brain and this may need to be done with open MRI. I would also referred the patient to radiation oncology for evaluation and consideration of concurrent radiotherapy. For Insomnia I gave the patient prescription for Restoril 30 mg by mouth each bedtime. I would also plan to her pharmacy with prescription for Compazine 10 mg by mouth every 6 hours as needed for nausea. The patient will have a chemotherapy education class before starting the first cycle of chemotherapy. She is expected to start the first cycle of her chemotherapy next week. She would come back for followup visit in 2 weeks for evaluation and management any adverse effect of her chemotherapy. I gave the patient and her family the time to ask questions and I answered them completely to their satisfaction All questions were answered. The patient knows to call the clinic with any problems, questions or  concerns. We can certainly see the patient much sooner if necessary.  Thank you so much for allowing me to participate in the care of Anita Gardner. I will continue to follow up the patient with you and assist in her care.  I spent 30 minutes counseling the patient face to face. The total time spent in the appointment was 60 minutes.  Saburo Luger K. 02/19/2013, 6:01 PM

## 2013-02-20 ENCOUNTER — Encounter: Payer: Self-pay | Admitting: Internal Medicine

## 2013-02-20 ENCOUNTER — Telehealth: Payer: Self-pay | Admitting: Internal Medicine

## 2013-02-20 ENCOUNTER — Telehealth: Payer: Self-pay | Admitting: *Deleted

## 2013-02-20 ENCOUNTER — Ambulatory Visit: Payer: No Typology Code available for payment source | Admitting: Internal Medicine

## 2013-02-20 NOTE — Progress Notes (Signed)
Patient and mom came by. I advised her would refer her to [facility] and gave her Lenise's card. She has no insurance and has not applied for Medicaid as of yet. She will be starting Neulasta. I will let Lenise know to forward to [facility]. I did give her an application for assistance. Once she gets back she would be eligible for [facility] fund to help with meds. She knows she is going to need meds for nausea. Really sweet lady and her mom.

## 2013-02-20 NOTE — Telephone Encounter (Signed)
Per staff message and POF I have scheduled appts.  JMW  

## 2013-02-20 NOTE — Telephone Encounter (Signed)
S/w pt's mom re appts for Dr. Kathrynn Running 4/3 and mri Sunday 4/6. Pt aware of appt tomorrow and will get new schedule when she comes in. appts were already showing on schedule and Shameeka aware I spoke with pt's mom.

## 2013-02-21 ENCOUNTER — Encounter: Payer: Self-pay | Admitting: Radiation Oncology

## 2013-02-21 ENCOUNTER — Other Ambulatory Visit: Payer: No Typology Code available for payment source

## 2013-02-21 ENCOUNTER — Ambulatory Visit
Admission: RE | Admit: 2013-02-21 | Discharge: 2013-02-21 | Disposition: A | Discharge status: Home / Self Care | Payer: No Typology Code available for payment source | Source: Ambulatory Visit | Attending: Radiation Oncology | Admitting: Radiation Oncology

## 2013-02-21 ENCOUNTER — Telehealth: Payer: Self-pay | Admitting: *Deleted

## 2013-02-21 ENCOUNTER — Encounter: Payer: Self-pay | Admitting: *Deleted

## 2013-02-21 ENCOUNTER — Encounter: Payer: Self-pay | Admitting: Internal Medicine

## 2013-02-21 VITALS — BP 138/79 | HR 73 | Temp 98.5°F | Ht 65.0 in | Wt 185.1 lb

## 2013-02-21 DIAGNOSIS — I10 Essential (primary) hypertension: Secondary | ICD-10-CM | POA: Insufficient documentation

## 2013-02-21 DIAGNOSIS — R059 Cough, unspecified: Secondary | ICD-10-CM | POA: Insufficient documentation

## 2013-02-21 DIAGNOSIS — R634 Abnormal weight loss: Secondary | ICD-10-CM | POA: Insufficient documentation

## 2013-02-21 DIAGNOSIS — C3492 Malignant neoplasm of unspecified part of left bronchus or lung: Secondary | ICD-10-CM

## 2013-02-21 DIAGNOSIS — R5381 Other malaise: Secondary | ICD-10-CM | POA: Insufficient documentation

## 2013-02-21 DIAGNOSIS — R05 Cough: Secondary | ICD-10-CM | POA: Insufficient documentation

## 2013-02-21 DIAGNOSIS — Z510 Encounter for antineoplastic radiation therapy: Secondary | ICD-10-CM | POA: Insufficient documentation

## 2013-02-21 DIAGNOSIS — R0602 Shortness of breath: Secondary | ICD-10-CM | POA: Insufficient documentation

## 2013-02-21 DIAGNOSIS — R42 Dizziness and giddiness: Secondary | ICD-10-CM | POA: Insufficient documentation

## 2013-02-21 DIAGNOSIS — C7931 Secondary malignant neoplasm of brain: Secondary | ICD-10-CM | POA: Insufficient documentation

## 2013-02-21 DIAGNOSIS — R070 Pain in throat: Secondary | ICD-10-CM | POA: Insufficient documentation

## 2013-02-21 DIAGNOSIS — C3490 Malignant neoplasm of unspecified part of unspecified bronchus or lung: Secondary | ICD-10-CM | POA: Insufficient documentation

## 2013-02-21 DIAGNOSIS — R0609 Other forms of dyspnea: Secondary | ICD-10-CM | POA: Insufficient documentation

## 2013-02-21 DIAGNOSIS — IMO0002 Reserved for concepts with insufficient information to code with codable children: Secondary | ICD-10-CM | POA: Insufficient documentation

## 2013-02-21 DIAGNOSIS — Y842 Radiological procedure and radiotherapy as the cause of abnormal reaction of the patient, or of later complication, without mention of misadventure at the time of the procedure: Secondary | ICD-10-CM | POA: Insufficient documentation

## 2013-02-21 DIAGNOSIS — H9209 Otalgia, unspecified ear: Secondary | ICD-10-CM | POA: Insufficient documentation

## 2013-02-21 DIAGNOSIS — R031 Nonspecific low blood-pressure reading: Secondary | ICD-10-CM | POA: Insufficient documentation

## 2013-02-21 DIAGNOSIS — R1310 Dysphagia, unspecified: Secondary | ICD-10-CM | POA: Insufficient documentation

## 2013-02-21 DIAGNOSIS — R0989 Other specified symptoms and signs involving the circulatory and respiratory systems: Secondary | ICD-10-CM | POA: Insufficient documentation

## 2013-02-21 DIAGNOSIS — E1165 Type 2 diabetes mellitus with hyperglycemia: Secondary | ICD-10-CM | POA: Insufficient documentation

## 2013-02-21 DIAGNOSIS — K219 Gastro-esophageal reflux disease without esophagitis: Secondary | ICD-10-CM | POA: Insufficient documentation

## 2013-02-21 DIAGNOSIS — L819 Disorder of pigmentation, unspecified: Secondary | ICD-10-CM | POA: Insufficient documentation

## 2013-02-21 DIAGNOSIS — R5383 Other fatigue: Secondary | ICD-10-CM | POA: Insufficient documentation

## 2013-02-21 DIAGNOSIS — R000 Tachycardia, unspecified: Secondary | ICD-10-CM | POA: Insufficient documentation

## 2013-02-21 DIAGNOSIS — C3430 Malignant neoplasm of lower lobe, unspecified bronchus or lung: Secondary | ICD-10-CM | POA: Insufficient documentation

## 2013-02-21 DIAGNOSIS — K208 Other esophagitis without bleeding: Secondary | ICD-10-CM | POA: Insufficient documentation

## 2013-02-21 MED ORDER — LORAZEPAM 0.5 MG PO TABS: 0.5000 mg | ORAL_TABLET | ORAL | Status: DC

## 2013-02-21 NOTE — Progress Notes (Signed)
Spoke with pt and gave her another EPP application as well as a Medicaid application.  She applied for Medicaid in the past but was denied.  She's going to reapply.  I will process application when she returns it with the needed info.

## 2013-02-21 NOTE — Telephone Encounter (Signed)
Pt is requesting something for anxiety/ claustrophobia for her MRI on Sunday 4/6.  Per dr Donnald Garre, okay to give her 0.5mg  ativan 1 hr before, then 0.5mg  right before MRI.  SLJ

## 2013-02-21 NOTE — Progress Notes (Signed)
Ms Brougher here for assessment for small cell lung cancer.  She travels by wheelchair.  She denies any pain, nor SOB while sitting, but does report exertional dyspnea and also she admits to feeling fatigued presently.  O2 sat 99% on RA. VSS .  She is scheduled for a MRI of her head on this coming Sunday.

## 2013-02-21 NOTE — Progress Notes (Signed)
Radiation Oncology         [phone]) [phone] ________________________________  Initial outpatient Consultation  Name: Anita Gardner MRN: [id number]  Date: 02/21/2013  DOB: [birth year]  JY:[id number], SHELLY, DO  Si Gaul, MD   REFERRING PHYSICIAN: Si Gaul, MD  DIAGNOSIS: Stage IIIA -pending brain MRI - small cell lung cancer  HISTORY OF PRESENT ILLNESS::Anita Gardner is a 57 y.o. female chronic smoker who presented with abdominal pain and underwent CT imaging of the abdomen and pelvis for this on 12/06/2012. No acute findings are appreciated in the abdomen or pelvis. However a 16mm left lung nodule  was appreciated. PET CT was recommended.  PET scan on 01/30/2013 significant for: 1. 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 2. Nodule along either the low left mediastinum or posterior leftpericardium is hypermetabolic and suspicious for nodal metastasis. Less likely, a synchronous primary pulmonary lesion could looksimilar. 3. No evidence of extrathoracic disease.   FNA on 02-14-13 showed: Diagnosis FINE NEEDLE ASPIRATION, ENDOSCOPIC EBUS #2, 7 NODE: MALIGNANT CELLS PRESENT, CONSISTENT WITH SMALL CELL CARCINOMA. SEE COMMENT. COMMENT: THE MALIGNANT CELLS ARE POSITIVE FOR CD56 AND SYNAPTOPHYSIN. THEY ARE NEGATIVE FOR CD45 AND P63 AND ESSENTIALLY NEGATIVE FOR CHROMOGRANIN.   She saw Dr. Shirline Frees of medical oncology 2 days ago. She has plans to start chemotherapy on Monday. She denies any headaches. She does have a little nausea. 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 She thinks that her right-sided vision is getting a little blurry your. She reports occasional shortness of breath with exertion, not significantly changed. She has a mild cough, not significantly changed. She was not able to tolerate an MRI of her brain and so she will undergo an open MRI this  coming Sunday. She does report weight loss since December. It appears that she has lost approximately 20lb since December 20. She attributes this to decreased appetite and eating healthy her foods as well as to stress. She is sitting in a wheelchair today but reports that she ambulates independently at home, sometimes using a cane.  PREVIOUS RADIATION THERAPY: No  PAST MEDICAL HISTORY:  has a past medical history of Chronic pain of left knee; Hypertension; Diabetic peripheral neuropathy; GERD (gastroesophageal reflux disease); Diabetes mellitus type 2, uncontrolled, without complications (DX: 2000); Constipation; Anxiety; Depression; Panic attacks; Bipolar 1 disorder; COPD (chronic obstructive pulmonary disease); and Small cell lung carcinoma (DX: 11/2012).    PAST SURGICAL HISTORY: Past Surgical History  Procedure Laterality Date  . Left leg surgery      as a child  . Video bronchoscopy with endobronchial ultrasound N/A 02/14/2013    Procedure: VIDEO BRONCHOSCOPY WITH ENDOBRONCHIAL ULTRASOUND;  Surgeon: Delight Ovens, MD;  Location: [facility] OR;  Service: Thoracic;  Laterality: N/A;    FAMILY HISTORY: family history includes CAD (age of onset: 40) in her mother and Diabetes in her mother.  SOCIAL HISTORY:  reports that she has been smoking Cigarettes.  She has a 20 pack-year smoking history. She has never used smokeless tobacco. She reports that she does not drink alcohol or use illicit drugs.  ALLERGIES: Ampicillin and Codeine  MEDICATIONS:  Current Outpatient Prescriptions  Medication Sig Dispense Refill  . Cholecalciferol (VITAMIN D-3) 5000 UNITS TABS Take 5,000 Units by mouth daily.      Marland Kitchen glyBURIDE (DIABETA) 5 MG tablet Take 1 tablet (5 mg total) by mouth 2 (two) times daily with a meal.  180 tablet  1  .  lisinopril (PRINIVIL,ZESTRIL) 20 MG tablet Take 1 tablet (20 mg total) by mouth daily.  90 tablet  1  . metFORMIN (GLUCOPHAGE) 1000 MG tablet Take 1 tablet (1,000 mg total) by mouth 2  (two) times daily with a meal.  180 tablet  1  . Multiple Vitamin (MULTIVITAMIN WITH MINERALS) TABS Take 1 tablet by mouth daily.      . Omega-3 Fatty Acids (FISH OIL) 1200 MG CAPS Take 1,200 mg by mouth daily.      . prochlorperazine (COMPAZINE) 10 MG tablet Take 1 tablet (10 mg total) by mouth every 6 (six) hours as needed.  60 tablet  0  . solifenacin (VESICARE) 5 MG tablet Take 10 mg by mouth daily.      . temazepam (RESTORIL) 30 MG capsule Take 1 capsule (30 mg total) by mouth at bedtime as needed for sleep.  30 capsule  0   No current facility-administered medications for this encounter.    REVIEW OF SYSTEMS:  Pertinent items are noted in HPI.   PHYSICAL EXAM:    Vitals - 1 value per visit 02/21/2013  SYSTOLIC 138  DIASTOLIC 79  PULSE 73  TEMPERATURE 98.5  RESPIRATIONS   Weight (lb) 185.1  HEIGHT 5\' 5"   BMI 30.8  VISIT REPORT    General: Alert and oriented, in no acute distress, sitting in a wheelchair HEENT: Head is normocephalic. Pupils are equally round and reactive to light. Extraocular movements are intact. Oropharynx - full tissues, no obvious lesions. Neck: Neck is supple, no palpable cervical or supraclavicular lymphadenopathy. Heart: Regular in rate and rhythm with no murmurs, rubs, or gallops. Chest: Clear to auscultation bilaterally, with no rhonchi, wheezes, or rales. Abdomen: Soft, nontender, nondistended, with no rigidity or guarding. Extremities: No cyanosis or edema. Lymphatics: No concerning lymphadenopathy. Musculoskeletal: Symmetric strength and muscle tone throughout. Neurologic: Cranial nerves II through XII are grossly intact. No obvious focalities. Speech is fluent. Coordination is intact. Psychiatric: Judgment and insight are intact. Affect is appropriate.   LABORATORY DATA:  Lab Results  Component Value Date   WBC 7.6 02/19/2013   HGB 14.5 02/19/2013   HCT 42.5 02/19/2013   MCV 93.6 02/19/2013   PLT 249 02/19/2013   CMP     Component Value  Date/Time   NA 139 02/19/2013 1359   NA 133* 02/14/2013 0643   K 4.1 02/19/2013 1359   K 4.0 02/14/2013 0643   CL 103 02/19/2013 1359   CL 96 02/14/2013 0643   CO2 24 02/19/2013 1359   CO2 25 02/14/2013 0643   GLUCOSE 273* 02/19/2013 1359   GLUCOSE 372* 02/14/2013 0643   BUN 6.6* 02/19/2013 1359   BUN 12 02/14/2013 0643   CREATININE 0.7 02/19/2013 1359   CREATININE 0.41* 02/14/2013 0643   CREATININE 0.75 11/29/2012 1601   CALCIUM 9.9 02/19/2013 1359   CALCIUM 9.6 02/14/2013 0643   PROT 6.8 02/19/2013 1359   PROT 7.2 02/14/2013 0643   ALBUMIN 3.6 02/19/2013 1359   ALBUMIN 4.1 02/14/2013 0643   AST 14 02/19/2013 1359   AST 15 02/14/2013 0643   ALT 27 02/19/2013 1359   ALT 23 02/14/2013 0643   ALKPHOS 76 02/19/2013 1359   ALKPHOS 78 02/14/2013 0643   BILITOT 0.61 02/19/2013 1359   BILITOT 0.4 02/14/2013 0643   GFRNONAA >90 02/14/2013 0643   GFRAA >90 02/14/2013 0643         RADIOGRAPHY: Dg Chest 2 View Within Previous 72 Hours.  Films Obtained On Friday Are Acceptable For  Monday And Tuesday Cases  02/14/2013  *RADIOLOGY REPORT*  Clinical Data: Preoperative radiograph for bronchoscopy.  Left lung MASS surgery.  CHEST - 2 VIEW  Comparison: PET CT 01/30/2013.  Findings: Anterior left lower lobe pulmonary mass lesion is present measuring 36 mm.  This appears to abut the pleura on the frontal view.  Cardiopericardial silhouette appears within normal limits. No airspace disease.  There is a small contour abnormality in the retrocardiac region, compatible with a lymph node, also demonstrated on prior scan.  Fullness of the left hilum consistent with adenopathy.  IMPRESSION: No acute abnormality.  Redemonstration of mediastinal adenopathy and anterior left lower lobe mass.   Original Report Authenticated By: Andreas Newport, M.D.    Nm Pet Image Initial (pi) Skull Base To Thigh  01/30/2013  *RADIOLOGY REPORT*  Clinical Data: Initial treatment strategy for left lower lobe lung nodule.  Smoker.  NUCLEAR MEDICINE PET SKULL  BASE TO THIGH  Fasting Blood Glucose:  389  Technique:  18.8 mCi F-18 FDG was injected intravenously. CT data was obtained and used for attenuation correction and anatomic localization only.  (This was not acquired as a diagnostic CT examination.) Additional exam technical data entered on technologist worksheet.  Comparison:  Abdominal pelvic CT 12/06/2012  Findings:  Neck: No abnormal hypermetabolism.  Chest:  Hypermetabolism corresponding to the left lower lobe pulmonary nodule.  This measures 2.6 x 2.7 cm on image 108/series 2.  Enlarged from 1.6 x 1.4 cm on 12/06/2012.  Hypermetabolic, a S.U.V. max of 7.8.  Subcarinal hypermetabolic node which measures 1.7 cm and a S.U.V. max of 6.5. Left sided mediastinal node measures 1.7 cm a S.U.V. max of 4.8 on image 94/series 2. Left infrahilar nodal metastasis which measures 1.2 cm and a S.U.V. max of 6.7 on image 94/series 2.On the inferior aspect of the left sided mediastinum, versus arising from the pericardium, is a 1.8 x 1.7 cm hypermetabolic nodule which measuresa S.U.V. max of 4.8 on image 110/series 2.  Abdomen/Pelvis:  No abnormal hypermetabolism.  Skeleton:  No abnormal osseous metabolism.  CT  images performed for attenuation correction demonstrate mild cerebral atrophy for age.  Sub centimeter left thyroid nodule which is nonspecific.Multivessel coronary artery atherosclerosis.  Mild left adrenal thickening, without hypermetabolism.  Foci of focal steatosis suspected in posterior aspect of segment 4 of the liver.  Example image 131/series 2.  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  IMPRESSION:  1.  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 2.  Nodule along either the low left mediastinum or posterior left pericardium is hypermetabolic and suspicious for nodal metastasis. Less likely, a synchronous primary pulmonary lesion could look similar. 3.  No evidence of  extrathoracic disease. 4. Age advanced coronary artery atherosclerosis.  Recommend assessment of coronary risk factors and consideration of medical therapy.   Original Report Authenticated By: Jeronimo Greaves, M.D.       IMPRESSION/PLAN: This is a pleasant 57 year old woman with at least Stage IIIA, [id number] small cell lung cancer. Brain MRI pending.  She has plans to start chemotherapy on Monday, April 7. I spoke with her about the risks benefits and side effects of radiotherapy to the chest. She understands that she would treated with curative intent if she is found to have a negative brain MRI. However if her brain MRI is positive for cancer, her treatment would be palliative, not curative. Tentatively I plan to treat her chest disease is 46 Gray in  33 fractions. This fractionation would change to a shorter course if treatment is palliative. She understands that brain radiotherapy would be indicated if she in fact has brain metastases. She understands that eventually we may also talk about prophylactic cranial irradiation of her brain is clear.  A consent form has been signed and placed in her chart. She understands that side effects may include but not necessarily be limited to fatigue, skin irritation, and esophagitis in the acute setting. We spoke about rare injury to internal organs such as the esophagus spinal cord lungs and heart.  She is enthusiastic about proceeding. Treatment planning will start today at simulation. Anticipate her radiotherapy will start next week after we have the results to her brain MRI.  Will refer her to nutrition and social work as well - history of weight loss and anxiety related to diagnosis.  I sense there may be some social issues - she is unaccompanied today and I am not sure if her social support is optimal. Any help from Social Work may assist her through completing therapy.   This is a very pleasant 57 year old woman with stage I spent 35 minutes minutes face to  face with the patient and more than 50% of that time was spent in counseling and/or coordination of care.    __________________________________________   Lonie Peak, MD

## 2013-02-21 NOTE — Progress Notes (Signed)
  Radiation Oncology         ([phone]) [phone] ________________________________  Name: Anita Gardner MRN: [id number]  Date: 02/21/2013  DOB: 05-Jun-1956  SIMULATION AND TREATMENT PLANNING NOTE, etc.  Outpatient  DIAGNOSIS:  At least Stage III Small Cell Lung Cancer  NARRATIVE:  The patient was brought to the CT Simulation planning suite.  Identity was confirmed.  All relevant records and images related to the planned course of therapy were reviewed.  The patient freely provided informed written consent to proceed with treatment after reviewing the details related to the planned course of therapy. The consent form was witnessed and verified by the simulation staff.    Then, the patient was set-up in a stable reproducible  supine position for radiation therapy.    RESPIRATORY MOTION MANAGEMENT SIMULATION  NARRATIVE:  In order to account for effect of respiratory motion on target structures and other organs in the planning and delivery of radiotherapy, this patient underwent respiratory motion management simulation.  To accomplish this, when the patient was brought to the CT simulation planning suite, 4D respiratory motion management CT images were obtained.  The CT images were loaded into the planning software.  Then, using a variety of tools including Cine, MIP, and standard views, the target volume and planning target volumes (PTV) were delineated.  Avoidance structures were contoured.  Treatment planning then occurred.  Dose volume histograms were generated and reviewed for each of the requested structure.  The resulting plan was carefully reviewed and approved today.  TREATMENT PLANNING NOTE: Treatment planning then occurred.  The radiation prescription was entered and confirmed.    A total of 5 medically necessary complex treatment devices were fabricated and supervised by me. (5 fields with MLCs)  3D conformal Radiotherapy will be used to spare spinal cord, esophagus, lungs, heart.  I  requested DVH of these organs. I requested Daily CBCT for accurate set up and tight margins.  The patient will receive 66 Gy in 14fractions. Dose will be adjusted if her Brain MRI this weekend is positive for mets.  Special Treatment Procedure Note: The patient will be receiving chemotherapy concurrently. Chemotherapy heightens the risk of side effects. I have considered this during the patient's treatment planning process and will monitor the patient accordingly for side effects on a weekly basis. Concurrent chemotherapy increases the complexity of this patient's treatment and therefore this constitutes a special treatment procedure. -----------------------------------  Lonie Peak, MD

## 2013-02-22 ENCOUNTER — Ambulatory Visit: Payer: No Typology Code available for payment source | Admitting: Radiation Oncology

## 2013-02-22 ENCOUNTER — Other Ambulatory Visit: Payer: Self-pay | Admitting: Radiation Oncology

## 2013-02-22 ENCOUNTER — Other Ambulatory Visit: Payer: Self-pay | Admitting: *Deleted

## 2013-02-22 ENCOUNTER — Ambulatory Visit: Payer: No Typology Code available for payment source

## 2013-02-22 ENCOUNTER — Telehealth: Payer: Self-pay | Admitting: *Deleted

## 2013-02-22 DIAGNOSIS — C3492 Malignant neoplasm of unspecified part of left bronchus or lung: Secondary | ICD-10-CM

## 2013-02-22 MED ORDER — PROCHLORPERAZINE MALEATE 10 MG PO TABS: 10.0000 mg | ORAL_TABLET | Freq: Four times a day (QID) | ORAL | Status: DC | PRN

## 2013-02-22 MED ORDER — TEMAZEPAM 30 MG PO CAPS: 30.0000 mg | ORAL_CAPSULE | Freq: Every evening | ORAL | Status: DC | PRN

## 2013-02-22 NOTE — Telephone Encounter (Signed)
CALLED PATIENT TO INFORM OF APPT. WITH THE NUTRITIONIST ON 02-27-13 AT 1:45 PM, SPOKE WITH THE PATIENT AND SHE IS AWARE OF THIS, I ALSO INFORMED THAT DR. SQUIRE HAD ME CONTACT THE SOCIAL WORKER TO ARRANGE FOR HER TO TALK TO HER.

## 2013-02-23 ENCOUNTER — Telehealth: Payer: Self-pay | Admitting: *Deleted

## 2013-02-23 ENCOUNTER — Ambulatory Visit
Admission: RE | Admit: 2013-02-23 | Discharge: 2013-02-23 | Disposition: A | Discharge status: Home / Self Care | Payer: No Typology Code available for payment source | Source: Ambulatory Visit | Attending: Radiation Oncology | Admitting: Radiation Oncology

## 2013-02-23 DIAGNOSIS — E119 Type 2 diabetes mellitus without complications: Secondary | ICD-10-CM | POA: Insufficient documentation

## 2013-02-23 DIAGNOSIS — C3490 Malignant neoplasm of unspecified part of unspecified bronchus or lung: Secondary | ICD-10-CM | POA: Insufficient documentation

## 2013-02-23 DIAGNOSIS — Z79899 Other long term (current) drug therapy: Secondary | ICD-10-CM | POA: Insufficient documentation

## 2013-02-23 DIAGNOSIS — I10 Essential (primary) hypertension: Secondary | ICD-10-CM | POA: Insufficient documentation

## 2013-02-23 LAB — BUN AND CREATININE (CC13)
BUN: 8.9 mg/dL (ref 7.0–26.0)
Creatinine: 0.7 mg/dL (ref 0.6–1.1)

## 2013-02-23 NOTE — Telephone Encounter (Signed)
Call and left voice message for [name]  Informing her that she needs to come in for labs, BUN and Creat, so that we can assess her kidney function prior to her resuming her Metformin.  She had a CT sim on Wednesday.  She has was instructed post -sim to not take her Metformin on Thursday nor today.  She complied with these directions.  She has labs ordered by Medical Oncology for Monday and [employer]

## 2013-02-23 NOTE — Telephone Encounter (Signed)
Relayed to mother that Ms. clyne can restart her Metformin on tomorrow and that her kidney functions drawn today were WNL.   Also called Ms. Creamer and relayed the same information to restart Metformin in the am on Saturday.  She stated understanding.

## 2013-02-25 ENCOUNTER — Ambulatory Visit
Admission: RE | Admit: 2013-02-25 | Discharge: 2013-02-25 | Disposition: A | Discharge status: Home / Self Care | Payer: No Typology Code available for payment source | Source: Ambulatory Visit | Attending: Internal Medicine | Admitting: Internal Medicine

## 2013-02-25 DIAGNOSIS — C3492 Malignant neoplasm of unspecified part of left bronchus or lung: Secondary | ICD-10-CM

## 2013-02-25 MED ORDER — GADOBENATE DIMEGLUMINE 529 MG/ML IV SOLN
17.0000 mL | Freq: Once | INTRAVENOUS | Status: AC | PRN
  Administered 2013-02-25: 17 mL via INTRAVENOUS

## 2013-02-26 ENCOUNTER — Other Ambulatory Visit: Payer: Self-pay | Admitting: Internal Medicine

## 2013-02-26 ENCOUNTER — Other Ambulatory Visit (HOSPITAL_BASED_OUTPATIENT_CLINIC_OR_DEPARTMENT_OTHER): Payer: No Typology Code available for payment source | Admitting: Lab

## 2013-02-26 ENCOUNTER — Ambulatory Visit (HOSPITAL_BASED_OUTPATIENT_CLINIC_OR_DEPARTMENT_OTHER): Payer: No Typology Code available for payment source

## 2013-02-26 VITALS — BP 136/92 | HR 121 | Temp 97.0°F

## 2013-02-26 DIAGNOSIS — C3430 Malignant neoplasm of lower lobe, unspecified bronchus or lung: Secondary | ICD-10-CM

## 2013-02-26 DIAGNOSIS — C3491 Malignant neoplasm of unspecified part of right bronchus or lung: Secondary | ICD-10-CM

## 2013-02-26 DIAGNOSIS — C3492 Malignant neoplasm of unspecified part of left bronchus or lung: Secondary | ICD-10-CM

## 2013-02-26 DIAGNOSIS — Z5111 Encounter for antineoplastic chemotherapy: Secondary | ICD-10-CM

## 2013-02-26 LAB — CBC WITH DIFFERENTIAL/PLATELET
BASO%: 0.2 % (ref 0.0–2.0)
Eosinophils Absolute: 0.1 10*3/uL (ref 0.0–0.5)
MCHC: 34.6 g/dL (ref 31.5–36.0)
MONO#: 0.5 10*3/uL (ref 0.1–0.9)
NEUT#: 6.9 10*3/uL — ABNORMAL HIGH (ref 1.5–6.5)
Platelets: 257 10*3/uL (ref 145–400)
RBC: 4.54 10*6/uL (ref 3.70–5.45)
RDW: 13.7 % (ref 11.2–14.5)
WBC: 8.9 10*3/uL (ref 3.9–10.3)
lymph#: 1.4 10*3/uL (ref 0.9–3.3)

## 2013-02-26 LAB — COMPREHENSIVE METABOLIC PANEL (CC13)
ALT: 31 U/L (ref 0–55)
Albumin: 3.6 g/dL (ref 3.5–5.0)
CO2: 22 mEq/L (ref 22–29)
Chloride: 98 mEq/L (ref 98–107)
Glucose: 273 mg/dl — ABNORMAL HIGH (ref 70–99)
Potassium: 4 mEq/L (ref 3.5–5.1)
Sodium: 132 mEq/L — ABNORMAL LOW (ref 136–145)
Total Bilirubin: 0.74 mg/dL (ref 0.20–1.20)
Total Protein: 6.7 g/dL (ref 6.4–8.3)

## 2013-02-26 MED ORDER — SODIUM CHLORIDE 0.9 % IV SOLN
120.0000 mg/m2 | Freq: Once | INTRAVENOUS | Status: AC
  Administered 2013-02-26: 230 mg via INTRAVENOUS
  Filled 2013-02-26: qty 11.5

## 2013-02-26 MED ORDER — ONDANSETRON 16 MG/50ML IVPB (CHCC)
16.0000 mg | Freq: Once | INTRAVENOUS | Status: AC
  Administered 2013-02-26: 16 mg via INTRAVENOUS

## 2013-02-26 MED ORDER — SODIUM CHLORIDE 0.9 % IV SOLN
Freq: Once | INTRAVENOUS | Status: AC
  Administered 2013-02-26: 15:00:00 via INTRAVENOUS

## 2013-02-26 MED ORDER — SODIUM CHLORIDE 0.9 % IV SOLN
722.0000 mg | Freq: Once | INTRAVENOUS | Status: AC
  Administered 2013-02-26: 720 mg via INTRAVENOUS
  Filled 2013-02-26: qty 72

## 2013-02-26 MED ORDER — DEXAMETHASONE SODIUM PHOSPHATE 4 MG/ML IJ SOLN
20.0000 mg | Freq: Once | INTRAMUSCULAR | Status: AC
  Administered 2013-02-26: 20 mg via INTRAVENOUS

## 2013-02-26 NOTE — Patient Instructions (Signed)
[facility] Discharge Instructions for Patients Receiving Chemotherapy  Today you received the following chemotherapy agents carboplatin/ VP-16 or Etoposide To help prevent nausea and vomiting after your treatment, we encourage you to take your nausea medication   Take it as often as prescribed.   If you develop nausea and vomiting that is not controlled by your nausea medication, call the clinic. If it is after clinic hours your family physician or the after hours number for the clinic or go to the Emergency Department.   BELOW ARE SYMPTOMS THAT SHOULD BE REPORTED IMMEDIATELY:  *FEVER GREATER THAN 100.5 F  *CHILLS WITH OR WITHOUT FEVER  NAUSEA AND VOMITING THAT IS NOT CONTROLLED WITH YOUR NAUSEA MEDICATION  *UNUSUAL SHORTNESS OF BREATH  *UNUSUAL BRUISING OR BLEEDING  TENDERNESS IN MOUTH AND THROAT WITH OR WITHOUT PRESENCE OF ULCERS  *URINARY PROBLEMS  *BOWEL PROBLEMS  UNUSUAL RASH Items with * indicate a potential emergency and should be followed up as soon as possible.  If this is your first treatment one of the nurses will contact you 24 hours after your treatment. Please let the nurse know about any problems that you may have experienced. Feel free to call the clinic you have any questions or concerns. The clinic phone number is [phone].   I have been informed and understand all the instructions given to me. I know to contact the clinic, my physician, or go to the Emergency Department if any problems should occur. I do not have any questions at this time, but understand that I may call the clinic during office hours   should I have any questions or need assistance in obtaining follow up care.    __________________________________________  _____________  __________ Signature of Patient or Authorized Representative            Date                   Time    __________________________________________ Nurse's Signature

## 2013-02-27 ENCOUNTER — Ambulatory Visit (HOSPITAL_BASED_OUTPATIENT_CLINIC_OR_DEPARTMENT_OTHER): Payer: No Typology Code available for payment source

## 2013-02-27 ENCOUNTER — Ambulatory Visit: Payer: No Typology Code available for payment source | Admitting: Nutrition

## 2013-02-27 DIAGNOSIS — C3491 Malignant neoplasm of unspecified part of right bronchus or lung: Secondary | ICD-10-CM

## 2013-02-27 DIAGNOSIS — C3410 Malignant neoplasm of upper lobe, unspecified bronchus or lung: Secondary | ICD-10-CM

## 2013-02-27 MED ORDER — SODIUM CHLORIDE 0.9 % IV SOLN
120.0000 mg/m2 | Freq: Once | INTRAVENOUS | Status: AC
  Administered 2013-02-27: 230 mg via INTRAVENOUS
  Filled 2013-02-27: qty 11.5

## 2013-02-27 MED ORDER — SODIUM CHLORIDE 0.9 % IV SOLN
Freq: Once | INTRAVENOUS | Status: AC
  Administered 2013-02-27: 15:00:00 via INTRAVENOUS

## 2013-02-27 MED ORDER — DEXAMETHASONE SODIUM PHOSPHATE 10 MG/ML IJ SOLN
10.0000 mg | Freq: Once | INTRAMUSCULAR | Status: AC
  Administered 2013-02-27: 10 mg via INTRAVENOUS

## 2013-02-27 MED ORDER — HOT PACK MISC ONCOLOGY
1.0000 | Freq: Once | Status: DC | PRN
  Filled 2013-02-27: qty 1

## 2013-02-27 MED ORDER — ONDANSETRON 8 MG/50ML IVPB (CHCC)
8.0000 mg | Freq: Once | INTRAVENOUS | Status: AC
  Administered 2013-02-27: 8 mg via INTRAVENOUS

## 2013-02-27 NOTE — Patient Instructions (Addendum)
[facility] Discharge Instructions for Patients Receiving Chemotherapy  Today you received the following chemotherapy agents Etoposide.  To help prevent nausea and vomiting after your treatment, we encourage you to take your nausea medication as directed. Follow separate instructions for Extravasation of left arm IV site. (elevate for 24 hours, apply warmth 3-4 times daily for 15-82mins)   If you develop nausea and vomiting that is not controlled by your nausea medication, call the clinic. If it is after clinic hours your family physician or the after hours number for the clinic or go to the Emergency Department.   BELOW ARE SYMPTOMS THAT SHOULD BE REPORTED IMMEDIATELY:  *FEVER GREATER THAN 100.5 F  *CHILLS WITH OR WITHOUT FEVER  NAUSEA AND VOMITING THAT IS NOT CONTROLLED WITH YOUR NAUSEA MEDICATION  *UNUSUAL SHORTNESS OF BREATH  *UNUSUAL BRUISING OR BLEEDING  TENDERNESS IN MOUTH AND THROAT WITH OR WITHOUT PRESENCE OF ULCERS  *URINARY PROBLEMS  *BOWEL PROBLEMS  UNUSUAL RASH Items with * indicate a potential emergency and should be followed up as soon as possible.  Please let the nurse know about any problems that you may have experienced. Feel free to call the clinic you have any questions or concerns. The clinic phone number is [phone].

## 2013-02-27 NOTE — Progress Notes (Signed)
Patient is a 57 year old female diagnosed with small cell lung cancer.  Past medical history includes hypertension, diabetes, depression, bipolar disorder, peripheral neuropathy, COPD, tobacco, and constipation.  Medications include vitamin D, DiaBeta, Ativan, Glucophage, multivitamin, omega-3 fatty acids, and Compazine.  Labs include sodium 132 and glucose 273 on April seventh.  Height: 65 inches. Weight: 185.1 pounds. Usual body weight 205 pounds 11/10/2012. BMI 30.8.   Patient had her first chemotherapy session yesterday. She denies nausea and vomiting however she does have fatigue. He states she had an issue with constipation but that has now resolved. She attributes her 20 pound weight loss over the last 3 and half months to discontinuing diet soft drinks. Patient reports her fasting blood sugar this morning was 272. She does try to follow a carbohydrate-controlled diet. She verbalizes good protein sources and states she tries to eat 2-3 times daily.  Nutrition diagnosis: Unintended weight loss related to new diagnosis of small cell lung cancer as evidenced by 10% weight loss from usual body weight.  Intervention: I educated patient and her mother on the importance of smaller more frequent meals 5-6 times daily. I've encouraged her to continue a carbohydrate-controlled diet: to be sure to add protein at every meal and snack. We've reviewed appropriate meals and snacks. I have encouraged patient to try some oral nutrition supplements such as sugar-free Carnation breakfast essentials, Glucerna, or boost glucose control. I've given her samples of these. I've educated her on strategies for improving constipation. I provided fact sheets and my contact information.  Monitoring, evaluation, goals: Patient will tolerate adequate calories and protein to minimize weight loss throughout treatment and achieve adequate glycemic control.  Next visit: Am available to follow patient throughout treatment.  She has my contact information and prefers to call with questions or concerns.

## 2013-02-28 ENCOUNTER — Ambulatory Visit (HOSPITAL_BASED_OUTPATIENT_CLINIC_OR_DEPARTMENT_OTHER): Payer: No Typology Code available for payment source

## 2013-02-28 ENCOUNTER — Ambulatory Visit
Admission: RE | Admit: 2013-02-28 | Discharge: 2013-02-28 | Disposition: A | Discharge status: Home / Self Care | Payer: No Typology Code available for payment source | Source: Ambulatory Visit | Attending: Radiation Oncology | Admitting: Radiation Oncology

## 2013-02-28 ENCOUNTER — Encounter: Payer: Self-pay | Admitting: Radiation Oncology

## 2013-02-28 VITALS — BP 135/88 | HR 84 | Temp 98.2°F | Resp 20

## 2013-02-28 DIAGNOSIS — Z5111 Encounter for antineoplastic chemotherapy: Secondary | ICD-10-CM

## 2013-02-28 DIAGNOSIS — C3410 Malignant neoplasm of upper lobe, unspecified bronchus or lung: Secondary | ICD-10-CM

## 2013-02-28 DIAGNOSIS — C3491 Malignant neoplasm of unspecified part of right bronchus or lung: Secondary | ICD-10-CM

## 2013-02-28 MED ORDER — ONDANSETRON 8 MG/50ML IVPB (CHCC)
8.0000 mg | Freq: Once | INTRAVENOUS | Status: AC
  Administered 2013-02-28: 8 mg via INTRAVENOUS

## 2013-02-28 MED ORDER — SODIUM CHLORIDE 0.9 % IV SOLN
Freq: Once | INTRAVENOUS | Status: AC
  Administered 2013-02-28: 15:00:00 via INTRAVENOUS

## 2013-02-28 MED ORDER — SODIUM CHLORIDE 0.9 % IV SOLN
120.0000 mg/m2 | Freq: Once | INTRAVENOUS | Status: AC
  Administered 2013-02-28: 230 mg via INTRAVENOUS
  Filled 2013-02-28: qty 11.5

## 2013-02-28 MED ORDER — DEXAMETHASONE SODIUM PHOSPHATE 10 MG/ML IJ SOLN
10.0000 mg | Freq: Once | INTRAMUSCULAR | Status: AC
  Administered 2013-02-28: 10 mg via INTRAVENOUS

## 2013-02-28 NOTE — Progress Notes (Signed)
Simulation Verification Note - outpatient, lung cancer, left chest  The patient was brought to the treatment unit and placed in the planned treatment position. The clinical setup was verified. Then port films were obtained and uploaded to the radiation oncology medical record software.  The treatment beams were carefully compared against the planned radiation fields. The position location and shape of the radiation fields was reviewed. They targeted volume of tissue appears to be appropriately covered by the radiation beams. Organs at risk appear to be excluded as planned.  Based on my personal review, I approved the simulation verification. The patient's treatment will proceed as planned.  -----------------------------------  Lonie Peak, MD

## 2013-02-28 NOTE — Patient Instructions (Addendum)
[facility] Discharge Instructions for Patients Receiving Chemotherapy  Today you received the following chemotherapy agents Etoposide.  To help prevent nausea and vomiting after your treatment, we encourage you to take your nausea medication as prescribed.   If you develop nausea and vomiting that is not controlled by your nausea medication, call the clinic. If it is after clinic hours your family physician or the after hours number for the clinic or go to the Emergency Department.   BELOW ARE SYMPTOMS THAT SHOULD BE REPORTED IMMEDIATELY:  *FEVER GREATER THAN 100.5 F  *CHILLS WITH OR WITHOUT FEVER  NAUSEA AND VOMITING THAT IS NOT CONTROLLED WITH YOUR NAUSEA MEDICATION  *UNUSUAL SHORTNESS OF BREATH  *UNUSUAL BRUISING OR BLEEDING  TENDERNESS IN MOUTH AND THROAT WITH OR WITHOUT PRESENCE OF ULCERS  *URINARY PROBLEMS  *BOWEL PROBLEMS  UNUSUAL RASH Items with * indicate a potential emergency and should be followed up as soon as possible.   Please let the nurse know about any problems that you may have experienced. Feel free to call the clinic you have any questions or concerns. The clinic phone number is [phone].   I have been informed and understand all the instructions given to me. I know to contact the clinic, my physician, or go to the Emergency Department if any problems should occur. I do not have any questions at this time, but understand that I may call the clinic during office hours   should I have any questions or need assistance in obtaining follow up care.    __________________________________________  _____________  __________ Signature of Patient or Authorized Representative            Date                   Time    __________________________________________ Nurse's Signature    

## 2013-03-01 ENCOUNTER — Ambulatory Visit (HOSPITAL_BASED_OUTPATIENT_CLINIC_OR_DEPARTMENT_OTHER): Payer: No Typology Code available for payment source

## 2013-03-01 ENCOUNTER — Telehealth: Payer: Self-pay | Admitting: *Deleted

## 2013-03-01 ENCOUNTER — Ambulatory Visit
Admission: RE | Admit: 2013-03-01 | Discharge: 2013-03-01 | Disposition: A | Discharge status: Home / Self Care | Payer: No Typology Code available for payment source | Source: Ambulatory Visit | Attending: Radiation Oncology | Admitting: Radiation Oncology

## 2013-03-01 VITALS — BP 121/74 | HR 98 | Temp 98.6°F

## 2013-03-01 DIAGNOSIS — C3410 Malignant neoplasm of upper lobe, unspecified bronchus or lung: Secondary | ICD-10-CM

## 2013-03-01 DIAGNOSIS — C3491 Malignant neoplasm of unspecified part of right bronchus or lung: Secondary | ICD-10-CM

## 2013-03-01 DIAGNOSIS — Z5189 Encounter for other specified aftercare: Secondary | ICD-10-CM

## 2013-03-01 MED ORDER — PEGFILGRASTIM INJECTION 6 MG/0.6ML
6.0000 mg | Freq: Once | SUBCUTANEOUS | Status: AC
  Administered 2013-03-01: 6 mg via SUBCUTANEOUS
  Filled 2013-03-01: qty 0.6

## 2013-03-01 NOTE — Telephone Encounter (Signed)
Per staff message and POF I have scheduled appts.  JMW  

## 2013-03-01 NOTE — Patient Instructions (Addendum)
Can take Claritin for the bone aches.  Take Claritin once daily for 5-7 days.  Use Tylenol and Ibuprofen for pain as needed.   Call if you get an elevated temperature over 100.5 or pain that is unrelieved with the Tylenol.  Pegfilgrastim injection What is this medicine? PEGFILGRASTIM (peg fil GRA stim) helps the body make more white blood cells. It is used to prevent infection in people with low amounts of white blood cells following cancer treatment. This medicine may be used for other purposes; ask your health care provider or pharmacist if you have questions. What should I tell my health care provider before I take this medicine? They need to know if you have any of these conditions: -sickle cell disease -an unusual or allergic reaction to pegfilgrastim, filgrastim, E.coli protein, other medicines, foods, dyes, or preservatives -pregnant or trying to get pregnant -breast-feeding How should I use this medicine? This medicine is for injection under the skin. It is usually given by a health care professional in a hospital or clinic setting. If you get this medicine at home, you will be taught how to prepare and give this medicine. Do not shake this medicine. Use exactly as directed. Take your medicine at regular intervals. Do not take your medicine more often than directed. It is important that you put your used needles and syringes in a special sharps container. Do not put them in a trash can. If you do not have a sharps container, call your pharmacist or healthcare provider to get one. Talk to your pediatrician regarding the use of this medicine in children. While this drug may be prescribed for children who weigh more than 45 kg for selected conditions, precautions do apply Overdosage: If you think you have taken too much of this medicine contact a poison control center or emergency room at once. NOTE: This medicine is only for you. Do not share this medicine with others. What if I miss a  dose? If you miss a dose, take it as soon as you can. If it is almost time for your next dose, take only that dose. Do not take double or extra doses. What may interact with this medicine? -lithium -medicines for growth therapy This list may not describe all possible interactions. Give your health care provider a list of all the medicines, herbs, non-prescription drugs, or dietary supplements you use. Also tell them if you smoke, drink alcohol, or use illegal drugs. Some items may interact with your medicine. What should I watch for while using this medicine? Visit your doctor for regular check ups. You will need important blood work done while you are taking this medicine. What side effects may I notice from receiving this medicine? Side effects that you should report to your doctor or health care professional as soon as possible: -allergic reactions like skin rash, itching or hives, swelling of the face, lips, or tongue -breathing problems -fever -pain, redness, or swelling where injected -shoulder pain -stomach or side pain Side effects that usually do not require medical attention (report to your doctor or health care professional if they continue or are bothersome): -aches, pains -headache -loss of appetite -nausea, vomiting -unusually tired This list may not describe all possible side effects. Call your doctor for medical advice about side effects. You may report side effects to FDA at 1-800-FDA-1088. Where should I keep my medicine? Keep out of the reach of children. Store in a refrigerator between 2 and 8 degrees C (36 and 46 degrees F).  Do not freeze. Keep in carton to protect from light. Throw away this medicine if it is left out of the refrigerator for more than 48 hours. Throw away any unused medicine after the expiration date. NOTE: This sheet is a summary. It may not cover all possible information. If you have questions about this medicine, talk to your doctor, pharmacist, or  health care provider.  2013, [employer]vier/[employer] Standard. (06/10/2008 3:41:44 PM)

## 2013-03-02 ENCOUNTER — Ambulatory Visit
Admission: RE | Admit: 2013-03-02 | Discharge: 2013-03-02 | Disposition: A | Discharge status: Home / Self Care | Payer: No Typology Code available for payment source | Source: Ambulatory Visit | Attending: Radiation Oncology | Admitting: Radiation Oncology

## 2013-03-02 ENCOUNTER — Telehealth: Payer: Self-pay | Admitting: Internal Medicine

## 2013-03-02 NOTE — Telephone Encounter (Signed)
pt came to get print out of April appt and avs..Marland KitchenMarland KitchenDone

## 2013-03-05 ENCOUNTER — Ambulatory Visit
Admission: RE | Admit: 2013-03-05 | Discharge: 2013-03-05 | Disposition: A | Discharge status: Home / Self Care | Payer: No Typology Code available for payment source | Source: Ambulatory Visit | Attending: Radiation Oncology | Admitting: Radiation Oncology

## 2013-03-05 ENCOUNTER — Telehealth: Payer: Self-pay | Admitting: *Deleted

## 2013-03-05 ENCOUNTER — Other Ambulatory Visit (HOSPITAL_BASED_OUTPATIENT_CLINIC_OR_DEPARTMENT_OTHER): Payer: No Typology Code available for payment source | Admitting: Lab

## 2013-03-05 ENCOUNTER — Encounter: Payer: Self-pay | Admitting: Radiation Oncology

## 2013-03-05 ENCOUNTER — Ambulatory Visit: Payer: No Typology Code available for payment source | Admitting: Physician Assistant

## 2013-03-05 DIAGNOSIS — C3492 Malignant neoplasm of unspecified part of left bronchus or lung: Secondary | ICD-10-CM

## 2013-03-05 DIAGNOSIS — C3410 Malignant neoplasm of upper lobe, unspecified bronchus or lung: Secondary | ICD-10-CM

## 2013-03-05 LAB — CBC WITH DIFFERENTIAL/PLATELET
EOS%: 0.4 % (ref 0.0–7.0)
Eosinophils Absolute: 0 10*3/uL (ref 0.0–0.5)
MCH: 32 pg (ref 25.1–34.0)
MCV: 95.7 fL (ref 79.5–101.0)
MONO%: 2.4 % (ref 0.0–14.0)
NEUT#: 4.7 10*3/uL (ref 1.5–6.5)
RBC: 4.48 10*6/uL (ref 3.70–5.45)
RDW: 13.3 % (ref 11.2–14.5)
lymph#: 1.1 10*3/uL (ref 0.9–3.3)

## 2013-03-05 LAB — COMPREHENSIVE METABOLIC PANEL (CC13)
ALT: 19 U/L (ref 0–55)
AST: 11 U/L (ref 5–34)
Albumin: 3.7 g/dL (ref 3.5–5.0)
Alkaline Phosphatase: 94 U/L (ref 40–150)
Chloride: 99 mEq/L (ref 98–107)
Potassium: 4.2 mEq/L (ref 3.5–5.1)
Sodium: 134 mEq/L — ABNORMAL LOW (ref 136–145)
Total Protein: 6.9 g/dL (ref 6.4–8.3)

## 2013-03-05 NOTE — Progress Notes (Signed)
Anita Gardner has received 3 treatments to her Left Lung.  She denies any SOB today.  Her BP is considerably lower than the past vitals in Epic.  Pulse feels irregular and it is difficult to auscultate her pulse with a stethoscope since it is faint..  She admits to feeling light headed today when she was moved for treatment.  Her BP was 83/52 with a pulse of 133 initially, then 87/42 with pulse of 111.  Both sets of vitals taken while sitting.

## 2013-03-05 NOTE — Telephone Encounter (Signed)
Pt is at radiation therapy - BP 80/? And tachycardia. Dr Saralyn Pilar suggest to send pt to ER. Stanton Kidney Ivannah Zody RN 03/05/13 2:30PM

## 2013-03-05 NOTE — Progress Notes (Signed)
   Weekly Management Note:  outpatient Current Dose:  6 Gy  Projected Dose: 60 Gy   Narrative:  The patient presents for routine under treatment assessment.  CBCT/MVCT images/Port film x-rays were reviewed.  The chart was checked. Feels a little lightheaded. No chest pain.    Physical Findings:  weight is 183 lb 3.2 oz (83.099 kg). Her temperature is 99.1 F (37.3 C). Her blood pressure is 87/42 and her pulse is 111.  Heart irregularly irregular and tachycardic  Impression:  The patient is tolerating radiotherapy. Exam/vitals suspicious for a-fib - unrelated to radiotherapy  Plan:  Continue radiotherapy as planned. Called pt's PCP.  PCP recommends ER over visit this pm in her office.  Patient refused going to ED.  Wants to go home.  She does have an appt in med onc today.  Pt agrees to go to this. Cheryl Notified Med/Onc of history, suspicion for A Fib.  Patient/daughter told to call 911 if she has chest pain, near syncope, increased lightheadedness at home, or any other acute concerns warranting call.  ________________________________   Lonie Peak, M.D.

## 2013-03-06 ENCOUNTER — Ambulatory Visit
Admission: RE | Admit: 2013-03-06 | Discharge: 2013-03-06 | Disposition: A | Discharge status: Home / Self Care | Payer: No Typology Code available for payment source | Source: Ambulatory Visit | Attending: Radiation Oncology | Admitting: Radiation Oncology

## 2013-03-06 NOTE — Progress Notes (Signed)
Anita Gardner returned to nursing for a Vital sign check.  Her BP is 123/78, and pulse is regular at 80 beats per minute upon palpation and auscultation.  She denies any pain or SOB and states she feels a little "light-headed."  Continues to travel by wheelchair

## 2013-03-07 ENCOUNTER — Ambulatory Visit
Admission: RE | Admit: 2013-03-07 | Discharge: 2013-03-07 | Disposition: A | Discharge status: Home / Self Care | Payer: No Typology Code available for payment source | Source: Ambulatory Visit | Attending: Radiation Oncology | Admitting: Radiation Oncology

## 2013-03-07 ENCOUNTER — Encounter: Payer: Self-pay | Admitting: Radiation Oncology

## 2013-03-07 NOTE — Progress Notes (Signed)
Weekly Management Note:  Site: Chest/ Left lung Current Dose:  1000  cGy Projected Dose: 6200  cGy  Narrative: The patient is seen today for routine under treatment assessment. CBCT/MVCT images/port films were reviewed. The chart was reviewed.   She seen today complaining of mild odynophagia and a sore throat. She began chemotherapy last week. She has had one week of radiation therapy thus far. Her symptoms are upper chest/lower neck and or above her treatment fields. She does have a history of "acid reflux".  Physical Examination: There were no vitals filed for this visit..  Weight:  . There is no cervical or supraclavicular lymphadenopathy. Oral cavity and oropharynx unremarkable to inspection. No evidence for candidiasis. Chest lungs clear. No significant skin changes.  Impression: Tolerating radiation therapy well. I suspect that she has GERD. I instructed her to start an H2 blocker.  Plan: Continue radiation therapy as planned.

## 2013-03-08 ENCOUNTER — Ambulatory Visit
Admission: RE | Admit: 2013-03-08 | Discharge: 2013-03-08 | Disposition: A | Discharge status: Home / Self Care | Payer: No Typology Code available for payment source | Source: Ambulatory Visit | Attending: Radiation Oncology | Admitting: Radiation Oncology

## 2013-03-08 NOTE — Progress Notes (Signed)
Pt in nursing following radiation tx to give new mobile #; she also stated she is not taking her Lisinopril. She was instructed to stop Lisinopril on 14/14 by Dr Basilio Cairo. She is asking if she should resume. Advised her to return to nursing tomorrow after tx and discuss w/Dr Basilio Cairo. Pt and family member verbalized understanding and agreement.

## 2013-03-09 ENCOUNTER — Ambulatory Visit
Admission: RE | Admit: 2013-03-09 | Discharge: 2013-03-09 | Disposition: A | Discharge status: Home / Self Care | Payer: No Typology Code available for payment source | Source: Ambulatory Visit | Attending: Radiation Oncology | Admitting: Radiation Oncology

## 2013-03-09 NOTE — Progress Notes (Signed)
Anita Gardner in nursing for BP check.  Her Bp is lower than her normal at 109/69.  She did not take her BP medication today.  Since 03/05/13 her  Systolic BP has been fluctuating from 83 to 142.  Encouraged her in the presence of her mother to contact her PCP so that she can be assessed and obtain instructions on how to manage her BP.  She stated she would.  She continues to have a regular pulse today.

## 2013-03-12 ENCOUNTER — Other Ambulatory Visit (HOSPITAL_BASED_OUTPATIENT_CLINIC_OR_DEPARTMENT_OTHER): Payer: No Typology Code available for payment source | Admitting: Lab

## 2013-03-12 ENCOUNTER — Ambulatory Visit
Admission: RE | Admit: 2013-03-12 | Discharge: 2013-03-12 | Disposition: A | Discharge status: Home / Self Care | Payer: No Typology Code available for payment source | Source: Ambulatory Visit | Attending: Radiation Oncology | Admitting: Radiation Oncology

## 2013-03-12 DIAGNOSIS — C3492 Malignant neoplasm of unspecified part of left bronchus or lung: Secondary | ICD-10-CM

## 2013-03-12 DIAGNOSIS — C3490 Malignant neoplasm of unspecified part of unspecified bronchus or lung: Secondary | ICD-10-CM

## 2013-03-12 LAB — CBC WITH DIFFERENTIAL/PLATELET
BASO%: 0.2 % (ref 0.0–2.0)
Basophils Absolute: 0 10*3/uL (ref 0.0–0.1)
EOS%: 0.2 % (ref 0.0–7.0)
MCH: 32.3 pg (ref 25.1–34.0)
MCHC: 34.3 g/dL (ref 31.5–36.0)
MCV: 94.1 fL (ref 79.5–101.0)
MONO%: 3.7 % (ref 0.0–14.0)
RBC: 4.03 10*6/uL (ref 3.70–5.45)
RDW: 13.4 % (ref 11.2–14.5)

## 2013-03-12 LAB — COMPREHENSIVE METABOLIC PANEL (CC13)
ALT: 18 U/L (ref 0–55)
BUN: 7.7 mg/dL (ref 7.0–26.0)
CO2: 25 mEq/L (ref 22–29)
Calcium: 9.7 mg/dL (ref 8.4–10.4)
Chloride: 99 mEq/L (ref 98–107)
Creatinine: 0.7 mg/dL (ref 0.6–1.1)
Total Bilirubin: 0.24 mg/dL (ref 0.20–1.20)

## 2013-03-13 ENCOUNTER — Encounter: Payer: Self-pay | Admitting: Radiation Oncology

## 2013-03-13 ENCOUNTER — Ambulatory Visit
Admission: RE | Admit: 2013-03-13 | Discharge: 2013-03-13 | Disposition: A | Discharge status: Home / Self Care | Payer: No Typology Code available for payment source | Source: Ambulatory Visit | Attending: Radiation Oncology | Admitting: Radiation Oncology

## 2013-03-13 MED ORDER — MAGIC MOUTHWASH W/LIDOCAINE: ORAL | Status: DC

## 2013-03-13 NOTE — Progress Notes (Signed)
Anita Gardner has received 9 fractions to her Left Lung.  She denies any SOB and her O2 sat is 96% on RA.  She denies any pain today, but c/o "weird" sensation on the back of her throat when she drinks liquids, but last pm it happened when she was eating.  Note that her BP is 99.0 today and she has a dry cough.  She c/o sensation of phlegm in her throat. presently

## 2013-03-13 NOTE — Progress Notes (Signed)
   Weekly Management Note:  Outpatient Current Dose:  18 Gy  Projected Dose: 62 Gy   Narrative:  The patient presents for routine under treatment assessment.  CBCT/MVCT images/Port film x-rays were reviewed.  The chart was checked. BP still a bit low. Holding Lisinopril  Hasn't started pill for GERD, but has Omeprazole. Has cough, helped by lozenges.  Physical Findings:  weight is 186 lb 8 oz (84.596 kg). Her temperature is 99 F (37.2 C). Her blood pressure is 101/71 and her pulse is 86. Her oxygen saturation is 96%.  NAD, in wheelchair  Impression:  The patient is tolerating radiotherapy.  Plan:  Continue radiotherapy as planned. Recommended she followup in 1-2 weeks with PCP to confirm holding Lisinopril is okay.  Take Omeprazole for GERD and to avoid worsening esophagitis once RT side effects begin. I Rx'd MMW with lidocaine to start in the next couple weeks, if needed.  ________________________________   Lonie Peak, M.D.

## 2013-03-14 ENCOUNTER — Ambulatory Visit
Admission: RE | Admit: 2013-03-14 | Discharge: 2013-03-14 | Disposition: A | Discharge status: Home / Self Care | Payer: No Typology Code available for payment source | Source: Ambulatory Visit | Attending: Radiation Oncology | Admitting: Radiation Oncology

## 2013-03-15 ENCOUNTER — Ambulatory Visit
Admission: RE | Admit: 2013-03-15 | Discharge: 2013-03-15 | Disposition: A | Discharge status: Home / Self Care | Payer: No Typology Code available for payment source | Source: Ambulatory Visit | Attending: Radiation Oncology | Admitting: Radiation Oncology

## 2013-03-16 ENCOUNTER — Ambulatory Visit
Admission: RE | Admit: 2013-03-16 | Discharge: 2013-03-16 | Disposition: A | Discharge status: Home / Self Care | Payer: No Typology Code available for payment source | Source: Ambulatory Visit | Attending: Radiation Oncology | Admitting: Radiation Oncology

## 2013-03-19 ENCOUNTER — Other Ambulatory Visit: Payer: Self-pay | Admitting: Internal Medicine

## 2013-03-19 ENCOUNTER — Ambulatory Visit (HOSPITAL_BASED_OUTPATIENT_CLINIC_OR_DEPARTMENT_OTHER): Payer: No Typology Code available for payment source

## 2013-03-19 ENCOUNTER — Ambulatory Visit
Admission: RE | Admit: 2013-03-19 | Discharge: 2013-03-19 | Disposition: A | Discharge status: Home / Self Care | Payer: No Typology Code available for payment source | Source: Ambulatory Visit | Attending: Radiation Oncology | Admitting: Radiation Oncology

## 2013-03-19 ENCOUNTER — Telehealth: Payer: Self-pay | Admitting: Internal Medicine

## 2013-03-19 ENCOUNTER — Other Ambulatory Visit (HOSPITAL_BASED_OUTPATIENT_CLINIC_OR_DEPARTMENT_OTHER): Payer: No Typology Code available for payment source | Admitting: Lab

## 2013-03-19 VITALS — BP 115/77 | HR 82 | Temp 99.2°F

## 2013-03-19 DIAGNOSIS — C3490 Malignant neoplasm of unspecified part of unspecified bronchus or lung: Secondary | ICD-10-CM

## 2013-03-19 DIAGNOSIS — Z5111 Encounter for antineoplastic chemotherapy: Secondary | ICD-10-CM

## 2013-03-19 DIAGNOSIS — C3492 Malignant neoplasm of unspecified part of left bronchus or lung: Secondary | ICD-10-CM

## 2013-03-19 DIAGNOSIS — C3410 Malignant neoplasm of upper lobe, unspecified bronchus or lung: Secondary | ICD-10-CM

## 2013-03-19 LAB — CBC WITH DIFFERENTIAL/PLATELET
Basophils Absolute: 0 10*3/uL (ref 0.0–0.1)
Eosinophils Absolute: 0 10*3/uL (ref 0.0–0.5)
HCT: 38.1 % (ref 34.8–46.6)
HGB: 13.2 g/dL (ref 11.6–15.9)
LYMPH%: 6.7 % — ABNORMAL LOW (ref 14.0–49.7)
MCHC: 34.6 g/dL (ref 31.5–36.0)
MONO#: 0.7 10*3/uL (ref 0.1–0.9)
NEUT%: 88.4 % — ABNORMAL HIGH (ref 38.4–76.8)
Platelets: 286 10*3/uL (ref 145–400)
WBC: 14 10*3/uL — ABNORMAL HIGH (ref 3.9–10.3)
lymph#: 0.9 10*3/uL (ref 0.9–3.3)

## 2013-03-19 LAB — COMPREHENSIVE METABOLIC PANEL (CC13)
AST: 14 U/L (ref 5–34)
Albumin: 3.8 g/dL (ref 3.5–5.0)
BUN: 7.8 mg/dL (ref 7.0–26.0)
CO2: 22 mEq/L (ref 22–29)
Calcium: 9.5 mg/dL (ref 8.4–10.4)
Chloride: 99 mEq/L (ref 98–107)
Creatinine: 0.7 mg/dL (ref 0.6–1.1)
Glucose: 185 mg/dl — ABNORMAL HIGH (ref 70–99)
Potassium: 4.3 mEq/L (ref 3.5–5.1)

## 2013-03-19 MED ORDER — DEXAMETHASONE SODIUM PHOSPHATE 4 MG/ML IJ SOLN
20.0000 mg | Freq: Once | INTRAMUSCULAR | Status: AC
  Administered 2013-03-19: 20 mg via INTRAVENOUS

## 2013-03-19 MED ORDER — SODIUM CHLORIDE 0.9 % IV SOLN
Freq: Once | INTRAVENOUS | Status: AC
  Administered 2013-03-19: 15:00:00 via INTRAVENOUS

## 2013-03-19 MED ORDER — ONDANSETRON 16 MG/50ML IVPB (CHCC)
16.0000 mg | Freq: Once | INTRAVENOUS | Status: AC
  Administered 2013-03-19: 16 mg via INTRAVENOUS

## 2013-03-19 MED ORDER — SODIUM CHLORIDE 0.9 % IV SOLN
722.0000 mg | Freq: Once | INTRAVENOUS | Status: AC
  Administered 2013-03-19: 720 mg via INTRAVENOUS
  Filled 2013-03-19: qty 72

## 2013-03-19 MED ORDER — ETOPOSIDE CHEMO INJECTION 1 GM/50ML
120.0000 mg/m2 | Freq: Once | INTRAVENOUS | Status: AC
  Administered 2013-03-19: 230 mg via INTRAVENOUS
  Filled 2013-03-19: qty 11.5

## 2013-03-19 NOTE — Patient Instructions (Addendum)
[facility] Discharge Instructions for Patients Receiving Chemotherapy  Today you received the following chemotherapy agents: Carboplatin and Etoposide.  To help prevent nausea and vomiting after your treatment, we encourage you to take your nausea medication, Compazine (Prochlorperazine). Take it every six hours as needed for nausea.   If you develop nausea and vomiting that is not controlled by your nausea medication, call the clinic. If it is after clinic hours your family physician or the after hours number for the clinic or go to the Emergency Department.   BELOW ARE SYMPTOMS THAT SHOULD BE REPORTED IMMEDIATELY:  *FEVER GREATER THAN 100.5 F  *CHILLS WITH OR WITHOUT FEVER  NAUSEA AND VOMITING THAT IS NOT CONTROLLED WITH YOUR NAUSEA MEDICATION  *UNUSUAL SHORTNESS OF BREATH  *UNUSUAL BRUISING OR BLEEDING  TENDERNESS IN MOUTH AND THROAT WITH OR WITHOUT PRESENCE OF ULCERS  *URINARY PROBLEMS  *BOWEL PROBLEMS  UNUSUAL RASH Items with * indicate a potential emergency and should be followed up as soon as possible.  Feel free to call the clinic you have any questions or concerns. The clinic phone number is [phone].   I have been informed and understand all the instructions given to me. I know to contact the clinic, my physician, or go to the Emergency Department if any problems should occur. I do not have any questions at this time, but understand that I may call the clinic during office hours   should I have any questions or need assistance in obtaining follow up care.

## 2013-03-19 NOTE — Telephone Encounter (Signed)
gv and printed appt sched and avs for pt  °

## 2013-03-19 NOTE — Progress Notes (Signed)
Weekly Management Note:  Site: Left lung Current Dose:  2600  cGy Projected Dose: 6200  cGy  Narrative: The patient is seen today for routine under treatment assessment. CBCT/MVCT images/port films were reviewed. The chart was reviewed.   She is without complaints today. She is not having any significant dysphagia. She is receiving more chemotherapy today. No respiratory difficulties.  Physical Examination: There were no vitals filed for this visit..  Weight:  . Lungs are clear.  Laboratory data: Lab Results  Component Value Date   WBC 14.0* 03/19/2013   HGB 13.2 03/19/2013   HCT 38.1 03/19/2013   MCV 93.2 03/19/2013   PLT 286 03/19/2013    Impression: Tolerating radiation therapy well.  Plan: Continue radiation therapy as planned.

## 2013-03-20 ENCOUNTER — Ambulatory Visit
Admission: RE | Admit: 2013-03-20 | Discharge: 2013-03-20 | Disposition: A | Discharge status: Home / Self Care | Payer: No Typology Code available for payment source | Source: Ambulatory Visit | Attending: Radiation Oncology | Admitting: Radiation Oncology

## 2013-03-20 ENCOUNTER — Other Ambulatory Visit: Payer: No Typology Code available for payment source | Admitting: Lab

## 2013-03-20 ENCOUNTER — Ambulatory Visit (HOSPITAL_BASED_OUTPATIENT_CLINIC_OR_DEPARTMENT_OTHER): Payer: No Typology Code available for payment source | Admitting: Internal Medicine

## 2013-03-20 ENCOUNTER — Telehealth: Payer: Self-pay | Admitting: Internal Medicine

## 2013-03-20 ENCOUNTER — Ambulatory Visit (HOSPITAL_BASED_OUTPATIENT_CLINIC_OR_DEPARTMENT_OTHER): Payer: No Typology Code available for payment source

## 2013-03-20 ENCOUNTER — Encounter: Payer: Self-pay | Admitting: Internal Medicine

## 2013-03-20 ENCOUNTER — Ambulatory Visit: Payer: No Typology Code available for payment source | Admitting: Internal Medicine

## 2013-03-20 VITALS — BP 104/71 | HR 97 | Temp 100.7°F | Resp 18 | Ht 65.0 in | Wt 188.0 lb

## 2013-03-20 DIAGNOSIS — C3492 Malignant neoplasm of unspecified part of left bronchus or lung: Secondary | ICD-10-CM

## 2013-03-20 DIAGNOSIS — C3430 Malignant neoplasm of lower lobe, unspecified bronchus or lung: Secondary | ICD-10-CM

## 2013-03-20 DIAGNOSIS — Z5111 Encounter for antineoplastic chemotherapy: Secondary | ICD-10-CM

## 2013-03-20 DIAGNOSIS — C3490 Malignant neoplasm of unspecified part of unspecified bronchus or lung: Secondary | ICD-10-CM

## 2013-03-20 MED ORDER — SODIUM CHLORIDE 0.9 % IV SOLN
Freq: Once | INTRAVENOUS | Status: AC
  Administered 2013-03-20: 15:00:00 via INTRAVENOUS

## 2013-03-20 MED ORDER — ONDANSETRON 8 MG/50ML IVPB (CHCC)
8.0000 mg | Freq: Once | INTRAVENOUS | Status: AC
  Administered 2013-03-20: 8 mg via INTRAVENOUS

## 2013-03-20 MED ORDER — SODIUM CHLORIDE 0.9 % IV SOLN
120.0000 mg/m2 | Freq: Once | INTRAVENOUS | Status: AC
  Administered 2013-03-20: 230 mg via INTRAVENOUS
  Filled 2013-03-20: qty 11.5

## 2013-03-20 MED ORDER — DEXAMETHASONE SODIUM PHOSPHATE 10 MG/ML IJ SOLN
10.0000 mg | Freq: Once | INTRAMUSCULAR | Status: AC
  Administered 2013-03-20: 10 mg via INTRAVENOUS

## 2013-03-20 MED ORDER — DIPHENHYDRAMINE HCL 50 MG/ML IJ SOLN
25.0000 mg | Freq: Once | INTRAMUSCULAR | Status: AC
Start: 2013-03-20 — End: 2013-03-20
  Administered 2013-03-20: 25 mg via INTRAVENOUS

## 2013-03-20 NOTE — Telephone Encounter (Signed)
gv and printed appt sched and avs for pt  °

## 2013-03-20 NOTE — Patient Instructions (Addendum)
Continue chemotherapy as scheduled. Followup in 3 weeks with repeat CT scan of the chest.

## 2013-03-20 NOTE — Progress Notes (Signed)
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  PRIOR THERAPY: None  CURRENT THERAPY: Systemic chemotherapy with carboplatin for AUC of 5 on day 1 and etoposide 120 mg/M2 days 1, 2 and 3 with Neulasta support on day 4. The patient is status post 1 cycle. This is concurrent with radiation.  INTERVAL HISTORY: Anita Gardner 57 y.o. female returns to the clinic today for followup visit accompanied by her mother. The patient is feeling fine today with no specific complaints. She tolerated the first cycle of her systemic chemotherapy with carboplatin and etoposide fairly well with no significant adverse effect except for alopecia and occasional nausea. She denied having any significant chest pain, shortness of breath, cough or hemoptysis. She started the second cycle of chemotherapy yesterday.  MEDICAL HISTORY: Past Medical History  Diagnosis Date  . Chronic pain of left knee     secondary to arthritis  . Hypertension   . Diabetic peripheral neuropathy   . GERD (gastroesophageal reflux disease)   . Diabetes mellitus type 2, uncontrolled, without complications DX: 2000    on oral medications only  . Constipation   . Anxiety   . Depression   . Panic attacks   . Bipolar 1 disorder   . COPD (chronic obstructive pulmonary disease)   . 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 Tyrone Sage (CVTS), Dr. Gwenyth Bouillon (Onc) // LLL mass noted 11/2012// PET scan 12/2012 - mediastinal + left infrahilar nodal metastasis AND low left mediastinum or posterior left pericardium is hypermetabolic likely nodal metastasis // Stage IIIA, T1bN2MX at diagnosis // Bronchoscopy 01/2013 confirmed small cell ca. // To start chemo/rad 02/2013    ALLERGIES:  is allergic to ampicillin and  codeine.  MEDICATIONS:  Current Outpatient Prescriptions  Medication Sig Dispense Refill  . Cholecalciferol (VITAMIN D-3) 5000 UNITS TABS Take 5,000 Units by mouth daily.      Marland Kitchen glyBURIDE (DIABETA) 5 MG tablet Take 1 tablet (5 mg total) by mouth 2 (two) times daily with a meal.  180 tablet  1  . lisinopril (PRINIVIL,ZESTRIL) 20 MG tablet Take 1 tablet (20 mg total) by mouth daily.  90 tablet  1  . LORazepam (ATIVAN) 0.5 MG tablet Take 1 tablet (0.5 mg total) by mouth as directed. 0.5mg  1 hr before MRI, and 0.5mg  if needed right before MRI  2 tablet  0  . metFORMIN (GLUCOPHAGE) 1000 MG tablet Take 1 tablet (1,000 mg total) by mouth 2 (two) times daily with a meal.  180 tablet  1  . Multiple Vitamin (MULTIVITAMIN WITH MINERALS) TABS Take 1 tablet by mouth daily.      . Omega-3 Fatty Acids (FISH OIL) 1200 MG CAPS Take 1,200 mg by mouth daily.      . prochlorperazine (COMPAZINE) 10 MG tablet Take 1 tablet (10 mg total) by mouth every 6 (six) hours as needed.  60 tablet  0  . solifenacin (VESICARE) 5 MG tablet Take 10 mg by mouth daily.      . temazepam (RESTORIL) 30 MG capsule Take 1 capsule (30 mg total) by mouth at bedtime as needed for sleep.  30 capsule  0   No current facility-administered medications for this visit.    SURGICAL HISTORY:  Past Surgical History  Procedure Laterality Date  .  Left leg surgery      as a child  . Video bronchoscopy with endobronchial ultrasound N/A 02/14/2013    Procedure: VIDEO BRONCHOSCOPY WITH ENDOBRONCHIAL ULTRASOUND;  Surgeon: Delight Ovens, MD;  Location: [facility] OR;  Service: Thoracic;  Laterality: N/A;    REVIEW OF SYSTEMS:  A comprehensive review of systems was negative except for: Constitutional: positive for fatigue   PHYSICAL EXAMINATION: General appearance: alert, cooperative, fatigued and no distress Head: Normocephalic, without obvious abnormality, atraumatic Neck: no adenopathy Lymph nodes: Cervical, supraclavicular, and axillary nodes  normal. Resp: clear to auscultation bilaterally Cardio: regular rate and rhythm, S1, S2 normal, no murmur, click, rub or gallop GI: soft, non-tender; bowel sounds normal; no masses,  no organomegaly Extremities: extremities normal, atraumatic, no cyanosis or edema Neurologic: Alert and oriented X 3, normal strength and tone. Normal symmetric reflexes. Normal coordination and gait  ECOG PERFORMANCE STATUS: 1 - Symptomatic but completely ambulatory  Blood pressure 104/71, pulse 97, temperature 100.7 F (38.2 C), temperature source Oral, resp. rate 18, height 5\' 5"  (1.651 m), weight 188 lb (85.276 kg).  LABORATORY DATA: Lab Results  Component Value Date   WBC 14.0* 03/19/2013   HGB 13.2 03/19/2013   HCT 38.1 03/19/2013   MCV 93.2 03/19/2013   PLT 286 03/19/2013      Chemistry      Component Value Date/Time   NA 135* 03/19/2013 1259   NA 133* 02/14/2013 0643   K 4.3 03/19/2013 1259   K 4.0 02/14/2013 0643   CL 99 03/19/2013 1259   CL 96 02/14/2013 0643   CO2 22 03/19/2013 1259   CO2 25 02/14/2013 0643   BUN 7.8 03/19/2013 1259   BUN 12 02/14/2013 0643   CREATININE 0.7 03/19/2013 1259   CREATININE 0.41* 02/14/2013 0643   CREATININE 0.75 11/29/2012 1601      Component Value Date/Time   CALCIUM 9.5 03/19/2013 1259   CALCIUM 9.6 02/14/2013 0643   ALKPHOS 87 03/19/2013 1259   ALKPHOS 78 02/14/2013 0643   AST 14 03/19/2013 1259   AST 15 02/14/2013 0643   ALT 16 03/19/2013 1259   ALT 23 02/14/2013 0643   BILITOT 0.32 03/19/2013 1259   BILITOT 0.4 02/14/2013 4132       RADIOGRAPHIC STUDIES: Mr Laqueta Jean GM Contrast  March 19, 2013  *RADIOLOGY REPORT*  Clinical Data: New diagnosis small cell lung cancer.  Staging.  MRI HEAD WITHOUT AND WITH CONTRAST  Technique:  Multiplanar, multiecho pulse sequences of the brain and surrounding structures were obtained according to standard protocol without and with intravenous contrast  Contrast: 17mL MULTIHANCE GADOBENATE DIMEGLUMINE 529 MG/ML IV SOLN  Comparison: None.   Findings: There is no evidence for acute infarction, intracranial hemorrhage, mass lesion, hydrocephalus, or extra-axial fluid. Slight premature atrophy for age 45.  Mild chronic microvascular ischemic change.  Remote left periventricular lacune narrow the atrium. 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  Normal midline structures.  No visible osseous disease.  Major intracranial vascular structures patent.  Post infusion, no abnormal enhancement of the brain or meninges. Slight rightward enhancement of the falx likely incidental meningioma.  Major dural venous sinuses patent.  Extracranial soft tissues unremarkable.  IMPRESSION: No evidence intracranial metastatic disease.  Mild atrophy.  Mild chronic microvascular ischemic changes as described.   Original Report Authenticated By: Davonna Belling, M.D.     ASSESSMENT: This is a very pleasant 57 years old white female with limited stage small cell lung cancer  status post 1 cycle of systemic chemotherapy with carboplatin and etoposide concurrent with radiation.   PLAN: The patient is doing fine today and we'll proceed with the rest of cycle #2 as scheduled. She would come back for followup visit in 3 weeks with repeat CT scan of the chest for reevaluation of her disease. The patient requested a handicap permit today. She was advised to call immediately if she has any concerning symptoms in the interval.  All questions were answered. The patient knows to call the clinic with any problems, questions or concerns. We can certainly see the patient much sooner if necessary.  I spent 15 minutes counseling the patient face to face. The total time spent in the appointment was 25 minutes.

## 2013-03-20 NOTE — Patient Instructions (Addendum)
[facility] Center Discharge Instructions for Patients Receiving Chemotherapy  Today you received the following chemotherapy agents Etoposide.  To help prevent nausea and vomiting after your treatment, we encourage you to take your nausea medication as prescribed.   If you develop nausea and vomiting that is not controlled by your nausea medication, call the clinic. If it is after clinic hours your family physician or the after hours number for the clinic or go to the Emergency Department.   BELOW ARE SYMPTOMS THAT SHOULD BE REPORTED IMMEDIATELY:  *FEVER GREATER THAN 100.5 F  *CHILLS WITH OR WITHOUT FEVER  NAUSEA AND VOMITING THAT IS NOT CONTROLLED WITH YOUR NAUSEA MEDICATION  *UNUSUAL SHORTNESS OF BREATH  *UNUSUAL BRUISING OR BLEEDING  TENDERNESS IN MOUTH AND THROAT WITH OR WITHOUT PRESENCE OF ULCERS  *URINARY PROBLEMS  *BOWEL PROBLEMS  UNUSUAL RASH Items with * indicate a potential emergency and should be followed up as soon as possible.  Feel free to call the clinic you have any questions or concerns. The clinic phone number is [phone].   I have been informed and understand all the instructions given to me. I know to contact the clinic, my physician, or go to the Emergency Department if any problems should occur. I do not have any questions at this time, but understand that I may call the clinic during office hours   should I have any questions or need assistance in obtaining follow up care.    __________________________________________  _____________  __________ Signature of Patient or Authorized Representative            Date                   Time    __________________________________________ Nurse's Signature    

## 2013-03-21 ENCOUNTER — Ambulatory Visit (HOSPITAL_BASED_OUTPATIENT_CLINIC_OR_DEPARTMENT_OTHER): Payer: No Typology Code available for payment source

## 2013-03-21 ENCOUNTER — Ambulatory Visit
Admission: RE | Admit: 2013-03-21 | Discharge: 2013-03-21 | Disposition: A | Discharge status: Home / Self Care | Payer: No Typology Code available for payment source | Source: Ambulatory Visit | Attending: Radiation Oncology | Admitting: Radiation Oncology

## 2013-03-21 VITALS — BP 115/76 | HR 63 | Temp 98.5°F

## 2013-03-21 DIAGNOSIS — C3430 Malignant neoplasm of lower lobe, unspecified bronchus or lung: Secondary | ICD-10-CM

## 2013-03-21 DIAGNOSIS — Z5111 Encounter for antineoplastic chemotherapy: Secondary | ICD-10-CM

## 2013-03-21 DIAGNOSIS — C3490 Malignant neoplasm of unspecified part of unspecified bronchus or lung: Secondary | ICD-10-CM

## 2013-03-21 MED ORDER — SODIUM CHLORIDE 0.9 % IV SOLN
Freq: Once | INTRAVENOUS | Status: AC
  Administered 2013-03-21: 15:00:00 via INTRAVENOUS

## 2013-03-21 MED ORDER — DEXAMETHASONE SODIUM PHOSPHATE 10 MG/ML IJ SOLN
10.0000 mg | Freq: Once | INTRAMUSCULAR | Status: AC
  Administered 2013-03-21: 10 mg via INTRAVENOUS

## 2013-03-21 MED ORDER — SODIUM CHLORIDE 0.9 % IV SOLN
120.0000 mg/m2 | Freq: Once | INTRAVENOUS | Status: AC
  Administered 2013-03-21: 230 mg via INTRAVENOUS
  Filled 2013-03-21: qty 11.5

## 2013-03-21 MED ORDER — ONDANSETRON 8 MG/50ML IVPB (CHCC)
8.0000 mg | Freq: Once | INTRAVENOUS | Status: AC
  Administered 2013-03-21: 8 mg via INTRAVENOUS

## 2013-03-21 NOTE — Patient Instructions (Addendum)
[facility] Discharge Instructions for Patients Receiving Chemotherapy  Today you received the following chemotherapy agents Etoposide.  To help prevent nausea and vomiting after your treatment, we encourage you to take your nausea medication.   If you develop nausea and vomiting that is not controlled by your nausea medication, call the clinic.   BELOW ARE SYMPTOMS THAT SHOULD BE REPORTED IMMEDIATELY:  *FEVER GREATER THAN 100.5 F  *CHILLS WITH OR WITHOUT FEVER  NAUSEA AND VOMITING THAT IS NOT CONTROLLED WITH YOUR NAUSEA MEDICATION  *UNUSUAL SHORTNESS OF BREATH  *UNUSUAL BRUISING OR BLEEDING  TENDERNESS IN MOUTH AND THROAT WITH OR WITHOUT PRESENCE OF ULCERS  *URINARY PROBLEMS  *BOWEL PROBLEMS  UNUSUAL RASH Items with * indicate a potential emergency and should be followed up as soon as possible.  Feel free to call the clinic you have any questions or concerns. The clinic phone number is [phone].    

## 2013-03-21 NOTE — Progress Notes (Signed)
Pt reports having another episode of urine incontinence last night.  States this is second episode.  Reports standing and bladder emptying without her control.  Reports able to have control most of time, however, has now had to episodes of incontinence.  Pt states she discussed first episode with MD during her visit with him yesterday.  Pt also continues to have fine maculopapular rash over back, abdomen and arms.   Denies itching.  States she received benadryl yesterday. States she is driving today and does not wish to receive benadryl during infusion.  RN instructed patient to obtain OTC benadryl and take before bedtime tonight.  Instructed to take benadryl PRN for rash.  If rash not improved after 2-3 days or worsens instructed pt to call this office or her primary care MD.   Above information forwarded to MD.

## 2013-03-22 ENCOUNTER — Telehealth: Payer: Self-pay | Admitting: Medical Oncology

## 2013-03-22 ENCOUNTER — Ambulatory Visit (HOSPITAL_BASED_OUTPATIENT_CLINIC_OR_DEPARTMENT_OTHER): Payer: No Typology Code available for payment source

## 2013-03-22 ENCOUNTER — Ambulatory Visit
Admission: RE | Admit: 2013-03-22 | Discharge: 2013-03-22 | Disposition: A | Discharge status: Home / Self Care | Payer: No Typology Code available for payment source | Source: Ambulatory Visit | Attending: Radiation Oncology | Admitting: Radiation Oncology

## 2013-03-22 ENCOUNTER — Encounter: Payer: No Typology Code available for payment source | Admitting: Internal Medicine

## 2013-03-22 VITALS — BP 124/69 | HR 85 | Temp 98.6°F

## 2013-03-22 DIAGNOSIS — C3490 Malignant neoplasm of unspecified part of unspecified bronchus or lung: Secondary | ICD-10-CM

## 2013-03-22 DIAGNOSIS — C3410 Malignant neoplasm of upper lobe, unspecified bronchus or lung: Secondary | ICD-10-CM

## 2013-03-22 DIAGNOSIS — Z5189 Encounter for other specified aftercare: Secondary | ICD-10-CM

## 2013-03-22 MED ORDER — PEGFILGRASTIM INJECTION 6 MG/0.6ML
6.0000 mg | Freq: Once | SUBCUTANEOUS | Status: AC
  Administered 2013-03-22: 6 mg via SUBCUTANEOUS
  Filled 2013-03-22: qty 0.6

## 2013-03-22 NOTE — Telephone Encounter (Signed)
erroneous

## 2013-03-23 ENCOUNTER — Ambulatory Visit
Admission: RE | Admit: 2013-03-23 | Discharge: 2013-03-23 | Disposition: A | Discharge status: Home / Self Care | Payer: No Typology Code available for payment source | Source: Ambulatory Visit | Attending: Radiation Oncology | Admitting: Radiation Oncology

## 2013-03-26 ENCOUNTER — Encounter: Payer: Self-pay | Admitting: Radiation Oncology

## 2013-03-26 ENCOUNTER — Other Ambulatory Visit (HOSPITAL_BASED_OUTPATIENT_CLINIC_OR_DEPARTMENT_OTHER): Payer: No Typology Code available for payment source | Admitting: Lab

## 2013-03-26 ENCOUNTER — Ambulatory Visit
Admission: RE | Admit: 2013-03-26 | Discharge: 2013-03-26 | Disposition: A | Discharge status: Home / Self Care | Payer: No Typology Code available for payment source | Source: Ambulatory Visit | Attending: Radiation Oncology | Admitting: Radiation Oncology

## 2013-03-26 DIAGNOSIS — C3430 Malignant neoplasm of lower lobe, unspecified bronchus or lung: Secondary | ICD-10-CM

## 2013-03-26 DIAGNOSIS — C3492 Malignant neoplasm of unspecified part of left bronchus or lung: Secondary | ICD-10-CM

## 2013-03-26 LAB — CBC WITH DIFFERENTIAL/PLATELET
BASO%: 0.3 % (ref 0.0–2.0)
Basophils Absolute: 0 10*3/uL (ref 0.0–0.1)
Eosinophils Absolute: 0 10*3/uL (ref 0.0–0.5)
HCT: 34 % — ABNORMAL LOW (ref 34.8–46.6)
LYMPH%: 4.4 % — ABNORMAL LOW (ref 14.0–49.7)
MCHC: 34.8 g/dL (ref 31.5–36.0)
MONO#: 0.2 10*3/uL (ref 0.1–0.9)
NEUT%: 92.6 % — ABNORMAL HIGH (ref 38.4–76.8)
Platelets: 193 10*3/uL (ref 145–400)
WBC: 6.2 10*3/uL (ref 3.9–10.3)

## 2013-03-26 LAB — COMPREHENSIVE METABOLIC PANEL (CC13)
BUN: 12.6 mg/dL (ref 7.0–26.0)
CO2: 26 mEq/L (ref 22–29)
Creatinine: 0.6 mg/dL (ref 0.6–1.1)
Glucose: 220 mg/dl — ABNORMAL HIGH (ref 70–99)
Total Bilirubin: 1.21 mg/dL — ABNORMAL HIGH (ref 0.20–1.20)

## 2013-03-26 MED ORDER — LORAZEPAM 0.5 MG PO TABS: 0.5000 mg | ORAL_TABLET | Freq: Three times a day (TID) | ORAL | Status: DC

## 2013-03-26 NOTE — Progress Notes (Signed)
   Weekly Management Note:  outpatient Current Dose:  36 Gy  Projected Dose: 62 Gy   Narrative:  The patient presents for routine under treatment assessment.  CBCT/MVCT images/Port film x-rays were reviewed.  The chart was checked. She is doing well. Still has cold sensation up her posterior neck when drinking cold liquids.. Will discuss with med/onc. On carbo/etopo chemo. Would like med for anxiety.  Physical Findings:  weight is 182 lb 4.8 oz (82.691 kg). Her oral temperature is 98.7 F (37.1 C). Her blood pressure is 121/75 and her pulse is 85. Her respiration is 20 and oxygen saturation is 99%.  NAD in wheelchair  CBC    Component Value Date/Time   WBC 6.2 03/26/2013 1300   WBC 8.1 02/14/2013 0643   RBC 3.61* 03/26/2013 1300   RBC 4.60 02/14/2013 0643   HGB 11.8 03/26/2013 1300   HGB 15.0 02/14/2013 0643   HCT 34.0* 03/26/2013 1300   HCT 42.0 02/14/2013 0643   PLT 193 03/26/2013 1300   PLT 246 02/14/2013 0643   MCV 94.3 03/26/2013 1300   MCV 91.3 02/14/2013 0643   MCH 32.8 03/26/2013 1300   MCH 32.6 02/14/2013 0643   MCHC 34.8 03/26/2013 1300   MCHC 35.7 02/14/2013 0643   RDW 13.1 03/26/2013 1300   RDW 13.3 02/14/2013 0643   LYMPHSABS 0.3* 03/26/2013 1300   LYMPHSABS 1.6 11/29/2012 1601   MONOABS 0.2 03/26/2013 1300   MONOABS 0.8 11/29/2012 1601   EOSABS 0.0 03/26/2013 1300   EOSABS 0.1 11/29/2012 1601   BASOSABS 0.0 03/26/2013 1300   BASOSABS 0.0 11/29/2012 1601   CMP     Component Value Date/Time   NA 135* 03/26/2013 1300   NA 133* 02/14/2013 0643   K 3.8 03/26/2013 1300   K 4.0 02/14/2013 0643   CL 99 03/26/2013 1300   CL 96 02/14/2013 0643   CO2 26 03/26/2013 1300   CO2 25 02/14/2013 0643   GLUCOSE 220* 03/26/2013 1300   GLUCOSE 372* 02/14/2013 0643   BUN 12.6 03/26/2013 1300   BUN 12 02/14/2013 0643   CREATININE 0.6 03/26/2013 1300   CREATININE 0.41* 02/14/2013 0643   CREATININE 0.75 11/29/2012 1601   CALCIUM 9.1 03/26/2013 1300   CALCIUM 9.6 02/14/2013 0643   PROT 6.6 03/26/2013 1300   PROT 7.2 02/14/2013 0643   ALBUMIN 3.6 03/26/2013 1300   ALBUMIN 4.1 02/14/2013 0643   AST 7 03/26/2013 1300   AST 15 02/14/2013 0643   ALT 15 03/26/2013 1300   ALT 23 02/14/2013 0643   ALKPHOS 105 03/26/2013 1300   ALKPHOS 78 02/14/2013 0643   BILITOT 1.21* 03/26/2013 1300   BILITOT 0.4 02/14/2013 0643   GFRNONAA >90 02/14/2013 0643   GFRAA >90 02/14/2013 4540     Impression:  The patient is tolerating radiotherapy.  Plan:  Continue radiotherapy as planned. Rx for ativan 0.5mg  tabs given.  ________________________________   Lonie Peak, M.D.

## 2013-03-26 NOTE — Progress Notes (Signed)
Pt denies pain, SOB, fatigue, loss of appetite. She reports nonprod cough, states she has a  "sensation up the back of my neck and up the back of my head when I eat cold foods". She states this "doesn't happen all the time". She is taking OTC for acid reflux. She is trying to quit smoking, down to 5-6 cigs/daily. She is requesting mild antianxiety med to help her stop smoking.

## 2013-03-27 ENCOUNTER — Ambulatory Visit
Admission: RE | Admit: 2013-03-27 | Discharge: 2013-03-27 | Disposition: A | Discharge status: Home / Self Care | Payer: No Typology Code available for payment source | Source: Ambulatory Visit | Attending: Radiation Oncology | Admitting: Radiation Oncology

## 2013-03-27 ENCOUNTER — Other Ambulatory Visit: Payer: Self-pay | Admitting: *Deleted

## 2013-03-27 NOTE — Telephone Encounter (Signed)
DR.MOHAMED WILL NOT REFILL VESICARE.

## 2013-03-28 ENCOUNTER — Ambulatory Visit
Admission: RE | Admit: 2013-03-28 | Discharge: 2013-03-28 | Disposition: A | Discharge status: Home / Self Care | Payer: No Typology Code available for payment source | Source: Ambulatory Visit | Attending: Radiation Oncology | Admitting: Radiation Oncology

## 2013-03-29 ENCOUNTER — Ambulatory Visit
Admission: RE | Admit: 2013-03-29 | Discharge: 2013-03-29 | Disposition: A | Discharge status: Home / Self Care | Payer: No Typology Code available for payment source | Source: Ambulatory Visit | Attending: Radiation Oncology | Admitting: Radiation Oncology

## 2013-03-30 ENCOUNTER — Ambulatory Visit
Admission: RE | Admit: 2013-03-30 | Discharge: 2013-03-30 | Disposition: A | Discharge status: Home / Self Care | Payer: No Typology Code available for payment source | Source: Ambulatory Visit | Attending: Radiation Oncology | Admitting: Radiation Oncology

## 2013-03-31 ENCOUNTER — Ambulatory Visit
Admission: RE | Admit: 2013-03-31 | Discharge: 2013-03-31 | Disposition: A | Discharge status: Home / Self Care | Payer: No Typology Code available for payment source | Source: Ambulatory Visit | Attending: Radiation Oncology | Admitting: Radiation Oncology

## 2013-04-02 ENCOUNTER — Other Ambulatory Visit (HOSPITAL_BASED_OUTPATIENT_CLINIC_OR_DEPARTMENT_OTHER): Payer: No Typology Code available for payment source | Admitting: Lab

## 2013-04-02 ENCOUNTER — Ambulatory Visit
Admission: RE | Admit: 2013-04-02 | Discharge: 2013-04-02 | Disposition: A | Discharge status: Home / Self Care | Payer: No Typology Code available for payment source | Source: Ambulatory Visit | Attending: Radiation Oncology | Admitting: Radiation Oncology

## 2013-04-02 ENCOUNTER — Encounter: Payer: Self-pay | Admitting: Radiation Oncology

## 2013-04-02 DIAGNOSIS — C3430 Malignant neoplasm of lower lobe, unspecified bronchus or lung: Secondary | ICD-10-CM

## 2013-04-02 DIAGNOSIS — C3492 Malignant neoplasm of unspecified part of left bronchus or lung: Secondary | ICD-10-CM

## 2013-04-02 LAB — CBC WITH DIFFERENTIAL/PLATELET
BASO%: 0.3 % (ref 0.0–2.0)
HCT: 31.9 % — ABNORMAL LOW (ref 34.8–46.6)
LYMPH%: 5.8 % — ABNORMAL LOW (ref 14.0–49.7)
MCH: 32.7 pg (ref 25.1–34.0)
MCHC: 35.7 g/dL (ref 31.5–36.0)
MCV: 91.4 fL (ref 79.5–101.0)
MONO#: 0.5 10*3/uL (ref 0.1–0.9)
MONO%: 4.5 % (ref 0.0–14.0)
NEUT%: 89.4 % — ABNORMAL HIGH (ref 38.4–76.8)
Platelets: 49 10*3/uL — ABNORMAL LOW (ref 145–400)
RBC: 3.49 10*6/uL — ABNORMAL LOW (ref 3.70–5.45)
WBC: 11 10*3/uL — ABNORMAL HIGH (ref 3.9–10.3)

## 2013-04-02 MED ORDER — RANITIDINE HCL 150 MG PO TABS: 150.0000 mg | ORAL_TABLET | Freq: Two times a day (BID) | ORAL | Status: DC

## 2013-04-02 NOTE — Progress Notes (Signed)
Anita Gardner has received 22 fractions to her left lung.  She reports that  When she swallows liquids and or food she gets a sensation of choking and pain in her esophagus and notes "burning in her belly".  She also experiences pain that starts at the back of her neck to the top of her head when swallowing liquids and/or food.  At times her family states she actually screams when this pain occurs.  She C/O aching of her upper gums bilaterally associated with bilateral ear pain, but she denies any changes in hearing ( started 8 days ago).

## 2013-04-03 ENCOUNTER — Ambulatory Visit
Admission: RE | Admit: 2013-04-03 | Discharge: 2013-04-03 | Disposition: A | Discharge status: Home / Self Care | Payer: No Typology Code available for payment source | Source: Ambulatory Visit | Attending: Radiation Oncology | Admitting: Radiation Oncology

## 2013-04-03 NOTE — Progress Notes (Signed)
   Weekly Management Note: Outpatient Current Dose:  46 Gy  Projected Dose: 62 Gy   Narrative:  The patient presents for routine under treatment assessment.  CBCT/MVCT images/Port film x-rays were reviewed.  The chart was checked. She reports upper throat choking/pain continuing when swallowing liquids.  Also, burning in belly, like acid.  B/l otalgia.    Physical Findings:  height is 5\' 5"  (1.651 m) and weight is 180 lb 14.4 oz (82.056 kg). Her temperature is 98.4 F (36.9 C). Her blood pressure is 101/60 and her pulse is 109.  NAD, no thrush.  CBC    Component Value Date/Time   WBC 11.0* 04/02/2013 1332   WBC 8.1 02/14/2013 0643   RBC 3.49* 04/02/2013 1332   RBC 4.60 02/14/2013 0643   HGB 11.4* 04/02/2013 1332   HGB 15.0 02/14/2013 0643   HCT 31.9* 04/02/2013 1332   HCT 42.0 02/14/2013 0643   PLT 49* 04/02/2013 1332   PLT 246 02/14/2013 0643   MCV 91.4 04/02/2013 1332   MCV 91.3 02/14/2013 0643   MCH 32.7 04/02/2013 1332   MCH 32.6 02/14/2013 0643   MCHC 35.7 04/02/2013 1332   MCHC 35.7 02/14/2013 0643   RDW 13.5 04/02/2013 1332   RDW 13.3 02/14/2013 0643   LYMPHSABS 0.6* 04/02/2013 1332   LYMPHSABS 1.6 11/29/2012 1601   MONOABS 0.5 04/02/2013 1332   MONOABS 0.8 11/29/2012 1601   EOSABS 0.0 04/02/2013 1332   EOSABS 0.1 11/29/2012 1601   BASOSABS 0.0 04/02/2013 1332   BASOSABS 0.0 11/29/2012 1601    CMP     Component Value Date/Time   NA 135* 03/26/2013 1300   NA 133* 02/14/2013 0643   K 3.8 03/26/2013 1300   K 4.0 02/14/2013 0643   CL 99 03/26/2013 1300   CL 96 02/14/2013 0643   CO2 26 03/26/2013 1300   CO2 25 02/14/2013 0643   GLUCOSE 220* 03/26/2013 1300   GLUCOSE 372* 02/14/2013 0643   BUN 12.6 03/26/2013 1300   BUN 12 02/14/2013 0643   CREATININE 0.6 03/26/2013 1300   CREATININE 0.41* 02/14/2013 0643   CREATININE 0.75 11/29/2012 1601   CALCIUM 9.1 03/26/2013 1300   CALCIUM 9.6 02/14/2013 0643   PROT 6.6 03/26/2013 1300   PROT 7.2 02/14/2013 0643   ALBUMIN 3.6 03/26/2013 1300   ALBUMIN 4.1 02/14/2013 0643   AST 7 03/26/2013 1300   AST 15 02/14/2013 0643   ALT 15 03/26/2013 1300   ALT 23 02/14/2013 0643   ALKPHOS 105 03/26/2013 1300   ALKPHOS 78 02/14/2013 0643   BILITOT 1.21* 03/26/2013 1300   BILITOT 0.4 02/14/2013 0643   GFRNONAA >90 02/14/2013 0643   GFRAA >90 02/14/2013 6295     Impression:  The patient is tolerating radiotherapy.  Plan:  Continue radiotherapy as planned. Offered MMW for throat pain (unrelated to RT, too high in esophagus).  Refused anything that would have numbing sensation.  Rx'd ranitidine for gastric symptoms.  Offered ENT referral which was refused. Reiterated importance or reporting sx to Med Onc in case these are secondary to Carbo/Etoposide.  ________________________________   Lonie Peak, M.D.

## 2013-04-04 ENCOUNTER — Ambulatory Visit
Admission: RE | Admit: 2013-04-04 | Discharge: 2013-04-04 | Disposition: A | Discharge status: Home / Self Care | Payer: No Typology Code available for payment source | Source: Ambulatory Visit | Attending: Radiation Oncology | Admitting: Radiation Oncology

## 2013-04-04 ENCOUNTER — Telehealth: Payer: Self-pay | Admitting: Medical Oncology

## 2013-04-04 NOTE — Telephone Encounter (Signed)
Daughter reports pt has screaming sharp pain at back of her throat when she swallows liquids and now foods x ( 2 weeks). Pt refused recommendations by  Dr Basilio Cairo -wants to talk to Dr Arbutus Ped first. Per Dr Arbutus Ped , he is in agreement with Dr Karoline Caldwell recommendation and I told [name] this.

## 2013-04-05 ENCOUNTER — Ambulatory Visit (INDEPENDENT_AMBULATORY_CARE_PROVIDER_SITE_OTHER): Payer: No Typology Code available for payment source | Admitting: Internal Medicine

## 2013-04-05 ENCOUNTER — Encounter: Payer: Self-pay | Admitting: Internal Medicine

## 2013-04-05 ENCOUNTER — Encounter: Payer: Self-pay | Admitting: Dietician

## 2013-04-05 ENCOUNTER — Ambulatory Visit
Admission: RE | Admit: 2013-04-05 | Discharge: 2013-04-05 | Disposition: A | Discharge status: Home / Self Care | Payer: No Typology Code available for payment source | Source: Ambulatory Visit | Attending: Radiation Oncology | Admitting: Radiation Oncology

## 2013-04-05 VITALS — BP 129/73 | HR 85 | Temp 98.2°F | Ht 65.0 in | Wt 182.3 lb

## 2013-04-05 DIAGNOSIS — J029 Acute pharyngitis, unspecified: Secondary | ICD-10-CM

## 2013-04-05 MED ORDER — SOLIFENACIN SUCCINATE 10 MG PO TABS: 10.0000 mg | ORAL_TABLET | Freq: Every day | ORAL | Status: DC

## 2013-04-05 NOTE — Progress Notes (Signed)
[name], female   DOB: 01-07-1956, 57 y.o.   MRN: [id number] Patient instructed on insulin injection using vial and syringe.  She demonstrated technique back without problems giving herself an injection of saline in her thigh. She verbalized she is unsure if she can do this at home. Denied desire for more education at this time. Educated about safe sharpes disposal, storage, action and timing of insulin. Encouraged patient to make an appointment to follow up with CDE.

## 2013-04-05 NOTE — Assessment & Plan Note (Signed)
Unclear etiology. No physical exam findings of erythema or exudate, no evidence of thrush. Low suspicion for esophagitis as patient has very mild pain with eating. Patient did have an elevation of her white cell count with a mild left shift just a few days ago. Patient is undergoing radiation therapy for small cell lung cancer, but her ANC is normal.  -Come back to clinic in one week for repeat CBC and repeat throat exam -Recommended cepachol anesthetic drops for symptomatic treatment

## 2013-04-05 NOTE — Progress Notes (Signed)
[name], female   DOB: December 13, 1955, 57 y.o.   MRN: [id number]  Internal Medicine Clinic Visit    HPI:  Anita Gardner is a 56 y.o. year old female with a history of small cell lung cancer currently undergoing radiation therapy, also has a history of diabetes, hypertension, COPD, arthritis. She presents for followup and to discuss sore throat.  Regarding her diabetes, she did not bring her meter to today's visit. She states her blood sugar has been running very high recently, often over 300s 1st thing in the morning before meals. She states that she follows a diabetic diet and does not eat any fat, sugar, and she tries to eat a lot of sandwiches. She's also been having frequent urination and feels thirsty all the time. For medications, she is only taking metformin and glyburide. Her last hemoglobin A1c was 10 in December 2013.  Patient would also like to discuss a new problem of sore throat. She states this started a few days ago, has mild pain with both eating and drinking. Has some mild chest pain and epigastric pain along with this. She is not on any acid suppressive medications, though she was given a prescription for rid edema recently. She does occasionally have back his taste in her mouth.  She denies any fever, chills, no nasal congestion, no nasal drainage. No sinus pressure. She has an occasional mild cough with clear sputum production. No change recently.   Past Medical History  Diagnosis Date  . Chronic pain of left knee     secondary to arthritis  . Hypertension   . Diabetic peripheral neuropathy   . GERD (gastroesophageal reflux disease)   . Diabetes mellitus type 2, uncontrolled, without complications DX: 2000    on oral medications only  . Constipation   . Anxiety   . Depression   . Panic attacks   . Bipolar 1 disorder   . COPD (chronic obstructive pulmonary disease)   . 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 Tyrone Sage (CVTS), Dr.  Gwenyth Bouillon (Onc) // LLL mass noted 11/2012// PET scan 12/2012 - mediastinal + left infrahilar nodal metastasis AND low left mediastinum or posterior left pericardium is hypermetabolic likely nodal metastasis // Stage IIIA, [id number] at diagnosis // Bronchoscopy 01/2013 confirmed small cell ca. // To start chemo/rad 02/2013    Past Surgical History  Procedure Laterality Date  . Left leg surgery      as a child  . Video bronchoscopy with endobronchial ultrasound N/A 02/14/2013    Procedure: VIDEO BRONCHOSCOPY WITH ENDOBRONCHIAL ULTRASOUND;  Surgeon: Delight Ovens, MD;  Location: [facility] OR;  Service: Thoracic;  Laterality: N/A;     ROS:  A complete review of systems was otherwise negative, except as noted in the HPI.  Allergies: Ampicillin and Codeine  Medications: Current Outpatient Prescriptions  Medication Sig Dispense Refill  . Cholecalciferol (VITAMIN D-3) 5000 UNITS TABS Take 5,000 Units by mouth daily.      Marland Kitchen glyBURIDE (DIABETA) 5 MG tablet Take 1 tablet (5 mg total) by mouth 2 (two) times daily with a meal.  180 tablet  1  . metFORMIN (GLUCOPHAGE) 1000 MG tablet Take 1 tablet (1,000 mg total) by mouth 2 (two) times daily with a meal.  180 tablet  1  . Multiple Vitamin (MULTIVITAMIN WITH MINERALS) TABS Take 1 tablet by mouth daily.      . Omega-3 Fatty Acids (FISH OIL) 1200 MG CAPS Take 1,200 mg by  mouth daily.      . prochlorperazine (COMPAZINE) 10 MG tablet Take 1 tablet (10 mg total) by mouth every 6 (six) hours as needed.  60 tablet  0  . ranitidine (ZANTAC) 150 MG tablet Take 1 tablet (150 mg total) by mouth 2 (two) times daily.  60 tablet  5  . solifenacin (VESICARE) 5 MG tablet Take 10 mg by mouth daily.      . temazepam (RESTORIL) 30 MG capsule Take 1 capsule (30 mg total) by mouth at bedtime as needed for sleep.  30 capsule  0   No current facility-administered medications for this visit.    History   Social History  . Marital Status: Divorced    Spouse Name: N/A     Number of Children: 2  . Years of Education: 10th grade   Occupational History  . 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 Smoking status: Current Every Day Smoker -- 0.50 packs/day for 40 years    Types: Cigarettes  . Smokeless tobacco: Never Used     Comment: 03/26/13 trying to quit approx 5 daily  . Alcohol Use: No  . Drug Use: No  . Sexually Active: Not Currently   Other Topics Concern  . Not on file   Social History Narrative   Lives at [place] alone, 2 children, divorced          family history includes CAD (age of onset: 45) in her mother and Diabetes in her mother.  Physical Exam Blood pressure 129/73, pulse 85, temperature 98.2 F (36.8 C), temperature source Oral, height 5\' 5"  (1.651 m), weight 182 lb 4.8 oz (82.691 kg), SpO2 96.00%. General:  No acute distress, alert and oriented x 3, well-appearing  HEENT:  PERRL, EOMI, no lymphadenopathy, moist mucous membranes, oropharynx is clear without any lesions, minimal erythema, no tonsillar enlargement, no exudates. Cardiovascular:  Regular rate and rhythm, no murmurs, rubs or gallops Respiratory:  Decreased breath sounds, clear Abdomen:  Soft, nondistended, nontender, normoactive bowel sounds Extremities:  Warm and well-perfused, no clubbing, cyanosis, or edema.  Skin: Warm, dry, no rashes Neuro: Not anxious appearing, no depressed mood, normal affect  Labs: Lab Results  Component Value Date   CREATININE 0.6 03/26/2013   BUN 12.6 03/26/2013   NA 135* 03/26/2013   K 3.8 03/26/2013   CL 99 03/26/2013   CO2 26 03/26/2013   Lab Results  Component Value Date   WBC 11.0* 04/02/2013   HGB 11.4* 04/02/2013   HCT 31.9* 04/02/2013   MCV 91.4 04/02/2013   PLT 49* 04/02/2013      Assessment and Plan:    FOLLOWUP: Anita Gardner will follow back up in our clinic in approximately 1 week. Anita Gardner knows to call out clinic in the meantime with any questions or new  issues.

## 2013-04-05 NOTE — Progress Notes (Signed)
Left msg with pt's mother to return my call.  Need to discuss status of her Medicaid.

## 2013-04-05 NOTE — Assessment & Plan Note (Addendum)
Would like to start 70/30, however, patient is not ready to start this yet. She would like to wait one week and have more discussion then. She was able to receive training to inject herself with insulin using syringes. I think the poorly controlled diabetes is the reason why patient is having frequent urination.  -At next visit, we will prescribe 10 units of Humulin 70/30 twice a day, patient can receive this prescription from the health department for $6 as well as syringes $6 for a one-month supply -At that time, we can discontinue her metformin and glyburide, but we discussed continuing these medications for now -Also discussed diabetic diet, I suspect patient will benefit from meeting with, again both for insulin management and nutritional counseling

## 2013-04-06 ENCOUNTER — Ambulatory Visit
Admission: RE | Admit: 2013-04-06 | Discharge: 2013-04-06 | Disposition: A | Discharge status: Home / Self Care | Payer: No Typology Code available for payment source | Source: Ambulatory Visit | Attending: Radiation Oncology | Admitting: Radiation Oncology

## 2013-04-06 ENCOUNTER — Encounter: Payer: Self-pay | Admitting: Internal Medicine

## 2013-04-06 NOTE — Progress Notes (Signed)
Spoke to pt when she was here for her visit today.  I spoke with DSS and she is approved for MCD disabled and MCD blind.  Pt stated she never got a card.  I gave her Tammy Smith's (case worker) number to follow up at [phone].

## 2013-04-09 ENCOUNTER — Encounter: Payer: Self-pay | Admitting: Radiation Oncology

## 2013-04-09 ENCOUNTER — Ambulatory Visit (HOSPITAL_COMMUNITY)
Admission: RE | Admit: 2013-04-09 | Discharge: 2013-04-09 | Disposition: A | Discharge status: Home / Self Care | Payer: No Typology Code available for payment source | Source: Ambulatory Visit | Attending: Internal Medicine | Admitting: Internal Medicine

## 2013-04-09 ENCOUNTER — Ambulatory Visit
Admission: RE | Admit: 2013-04-09 | Discharge: 2013-04-09 | Disposition: A | Discharge status: Home / Self Care | Payer: No Typology Code available for payment source | Source: Ambulatory Visit | Attending: Radiation Oncology | Admitting: Radiation Oncology

## 2013-04-09 ENCOUNTER — Other Ambulatory Visit (HOSPITAL_BASED_OUTPATIENT_CLINIC_OR_DEPARTMENT_OTHER): Payer: No Typology Code available for payment source | Admitting: Lab

## 2013-04-09 ENCOUNTER — Encounter (HOSPITAL_COMMUNITY): Payer: Self-pay

## 2013-04-09 DIAGNOSIS — R05 Cough: Secondary | ICD-10-CM | POA: Insufficient documentation

## 2013-04-09 DIAGNOSIS — C3430 Malignant neoplasm of lower lobe, unspecified bronchus or lung: Secondary | ICD-10-CM

## 2013-04-09 DIAGNOSIS — C3492 Malignant neoplasm of unspecified part of left bronchus or lung: Secondary | ICD-10-CM

## 2013-04-09 DIAGNOSIS — Z9221 Personal history of antineoplastic chemotherapy: Secondary | ICD-10-CM | POA: Insufficient documentation

## 2013-04-09 DIAGNOSIS — R059 Cough, unspecified: Secondary | ICD-10-CM | POA: Insufficient documentation

## 2013-04-09 DIAGNOSIS — Z923 Personal history of irradiation: Secondary | ICD-10-CM | POA: Insufficient documentation

## 2013-04-09 LAB — COMPREHENSIVE METABOLIC PANEL (CC13)
CO2: 24 mEq/L (ref 22–29)
Creatinine: 0.6 mg/dL (ref 0.6–1.1)
Glucose: 154 mg/dl — ABNORMAL HIGH (ref 70–99)
Total Bilirubin: 0.43 mg/dL (ref 0.20–1.20)

## 2013-04-09 LAB — CBC WITH DIFFERENTIAL/PLATELET
Eosinophils Absolute: 0 10*3/uL (ref 0.0–0.5)
HCT: 30.5 % — ABNORMAL LOW (ref 34.8–46.6)
LYMPH%: 6.2 % — ABNORMAL LOW (ref 14.0–49.7)
MCHC: 34.9 g/dL (ref 31.5–36.0)
MCV: 95.2 fL (ref 79.5–101.0)
MONO#: 0.6 10*3/uL (ref 0.1–0.9)
MONO%: 6.8 % (ref 0.0–14.0)
NEUT#: 8.2 10*3/uL — ABNORMAL HIGH (ref 1.5–6.5)
NEUT%: 86.8 % — ABNORMAL HIGH (ref 38.4–76.8)
Platelets: 282 10*3/uL (ref 145–400)
WBC: 9.5 10*3/uL (ref 3.9–10.3)

## 2013-04-09 MED ORDER — SUCRALFATE 1 GM/10ML PO SUSP: 1.0000 g | Freq: Four times a day (QID) | ORAL | Status: DC

## 2013-04-09 MED ORDER — IOHEXOL 300 MG/ML  SOLN
80.0000 mL | Freq: Once | INTRAMUSCULAR | Status: AC | PRN
  Administered 2013-04-09: 80 mL via INTRAVENOUS

## 2013-04-09 NOTE — Progress Notes (Signed)
Weekly Management Note:  Site: Left lung Current Dose:  5600  cGy Projected Dose: 6200  cGy  Narrative: The patient is seen today for routine under treatment assessment. CBCT/MVCT images/port films were reviewed. The chart was reviewed.   She is doing recently well, however she does have mild odynophagia and dysphagia presumably from esophagitis. She is also having some loosening of her bowels which she trips to metformin and she will take Imodium. Her weight is down 1 pound over the past week. She reports moderate fatigue.  Physical Examination:  Filed Vitals:   04/09/13 1345  BP: 119/76  Pulse: 76  Temp: 99.1 F (37.3 C)  .  Weight: 181 lb 11.2 oz (82.419 kg). There is hyperpigmentation of the skin along the left posterior chest with patchy dry desquamation. Lungs are clear.  Impression: Tolerating radiation therapy well, although she does have mild to moderate radiation esophagitis as expected. On her started on Carafate slurry to use when necessary.  Plan: Continue radiation therapy as planned.

## 2013-04-09 NOTE — Progress Notes (Signed)
Anita Gardner has received 27 fractions to her left lung.  She reports that she cannot swallow and it hurts when she does swallow and greades this discomfort as a level 9 on a scale of 0-10.Marland Kitchen  She is taking Zantac only. She has lost less than 1 lb since  Last week, but has lost 7 lbs since 03/20/13.  She is asking for relief of pain on swallowing.  She has a dry cough with occasional production of clear phelgm.   Note temp of 99.1 today and she reports she has had 3 watery stools in the past 24 hours.  Note that she is on Metformin which can cause episodic diarrhea.  Travel by wheelchair today.  Note hyperpigmentation with redness on her back on the left side.  She is applying Radiaplex gel  And encouraged to use BID.

## 2013-04-10 ENCOUNTER — Ambulatory Visit (HOSPITAL_BASED_OUTPATIENT_CLINIC_OR_DEPARTMENT_OTHER): Payer: No Typology Code available for payment source | Admitting: Internal Medicine

## 2013-04-10 ENCOUNTER — Telehealth: Payer: Self-pay | Admitting: Internal Medicine

## 2013-04-10 ENCOUNTER — Encounter: Payer: Self-pay | Admitting: Internal Medicine

## 2013-04-10 ENCOUNTER — Ambulatory Visit
Admission: RE | Admit: 2013-04-10 | Discharge: 2013-04-10 | Disposition: A | Discharge status: Home / Self Care | Payer: No Typology Code available for payment source | Source: Ambulatory Visit | Attending: Radiation Oncology | Admitting: Radiation Oncology

## 2013-04-10 VITALS — BP 107/73 | HR 104 | Temp 98.4°F | Resp 18 | Ht 65.0 in | Wt 181.9 lb

## 2013-04-10 DIAGNOSIS — C3430 Malignant neoplasm of lower lobe, unspecified bronchus or lung: Secondary | ICD-10-CM

## 2013-04-10 DIAGNOSIS — I10 Essential (primary) hypertension: Secondary | ICD-10-CM

## 2013-04-10 DIAGNOSIS — C3492 Malignant neoplasm of unspecified part of left bronchus or lung: Secondary | ICD-10-CM

## 2013-04-10 DIAGNOSIS — E119 Type 2 diabetes mellitus without complications: Secondary | ICD-10-CM

## 2013-04-10 NOTE — Patient Instructions (Signed)
Continue chemotherapy as scheduled. Followup visit in 3 weeks with the next cycle of chemotherapy.

## 2013-04-10 NOTE — Telephone Encounter (Signed)
Gave pt appt for May 2014 chemo and inj, pt will pick june appt tomorrow with lab, MD and injections for June 2014

## 2013-04-10 NOTE — Progress Notes (Signed)
[facility] Telephone:([phone]   Fax:(336) [phone]  OFFICE PROGRESS NOTE  [name], DO [address] [place] [address]  DIAGNOSIS: Limited stage small cell lung cancer diagnosed in March of 2014.   PRIOR THERAPY: None   CURRENT THERAPY: Systemic chemotherapy with carboplatin for AUC of 5 on day 1 and etoposide 120 mg/M2 days 1, 2 and 3 with Neulasta support on day 4. The patient is status post 2 cycles. This is concurrent with radiation.  INTERVAL HISTORY: Glenette L Tomasetti 57 y.o. female returns to the clinic today for followup visit accompanied by her mother. The patient tolerated the second cycle of her systemic chemotherapy with carboplatin and etoposide fairly well. She also completed the course of concurrent radiotherapy fairly well with no significant adverse effects. She denied having any significant nausea or vomiting. She denied having any chest pain, shortness of breath except with exertion, no cough or hemoptysis. The patient denied having any significant nausea or vomiting, no fever or chills. She had repeat CT scan of the chest performed recently and she is here for evaluation and discussion of her scan results.  MEDICAL HISTORY: Past Medical History  Diagnosis Date  . Chronic pain of left knee     secondary to arthritis  . Hypertension   . Diabetic peripheral neuropathy   . GERD (gastroesophageal reflux disease)   . Diabetes mellitus type 2, uncontrolled, without complications DX: 2000    on oral medications only  . Constipation   . Anxiety   . Depression   . Panic attacks   . Bipolar 1 disorder   . COPD (chronic obstructive pulmonary disease)   . 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 Tyrone Sage (CVTS), Dr. Gwenyth Bouillon (Onc) // LLL mass noted 11/2012// PET scan 12/2012 - mediastinal + left infrahilar nodal metastasis AND low left mediastinum or posterior left pericardium is hypermetabolic likely nodal  metastasis // Stage IIIA, T1bN2MX at diagnosis // Bronchoscopy 01/2013 confirmed small cell ca. // To start chemo/rad 02/2013    ALLERGIES:  is allergic to ampicillin and codeine.  MEDICATIONS:  Current Outpatient Prescriptions  Medication Sig Dispense Refill  . Cholecalciferol (VITAMIN D-3) 5000 UNITS TABS Take 5,000 Units by mouth daily.      Marland Kitchen glyBURIDE (DIABETA) 5 MG tablet Take 1 tablet (5 mg total) by mouth 2 (two) times daily with a meal.  180 tablet  1  . metFORMIN (GLUCOPHAGE) 1000 MG tablet Take 1 tablet (1,000 mg total) by mouth 2 (two) times daily with a meal.  180 tablet  1  . Multiple Vitamin (MULTIVITAMIN WITH MINERALS) TABS Take 1 tablet by mouth daily.      . Omega-3 Fatty Acids (FISH OIL) 1200 MG CAPS Take 1,200 mg by mouth daily.      . prochlorperazine (COMPAZINE) 10 MG tablet Take 1 tablet (10 mg total) by mouth every 6 (six) hours as needed.  60 tablet  0  . ranitidine (ZANTAC) 150 MG tablet Take 1 tablet (150 mg total) by mouth 2 (two) times daily.  60 tablet  5  . solifenacin (VESICARE) 10 MG tablet Take 1 tablet (10 mg total) by mouth daily.  30 tablet  4  . sucralfate (CARAFATE) 1 GM/10ML suspension Take 10 mLs (1 g total) by mouth 4 (four) times daily.  420 mL  1   No current facility-administered medications for this visit.    SURGICAL HISTORY:  Past Surgical History  Procedure Laterality Date  . Left leg surgery      as a child  . Video bronchoscopy with endobronchial ultrasound N/A 02/14/2013    Procedure: VIDEO BRONCHOSCOPY WITH ENDOBRONCHIAL ULTRASOUND;  Surgeon: Delight Ovens, MD;  Location: [facility] OR;  Service: Thoracic;  Laterality: N/A;    REVIEW OF SYSTEMS:  A comprehensive review of systems was negative except for: Constitutional: positive for fatigue   PHYSICAL EXAMINATION: General appearance: alert, cooperative, fatigued and no distress Head: Normocephalic, without obvious abnormality, atraumatic Neck: no adenopathy Lymph nodes: Cervical,  supraclavicular, and axillary nodes normal. Resp: clear to auscultation bilaterally Cardio: regular rate and rhythm, S1, S2 normal, no murmur, click, rub or gallop GI: soft, non-tender; bowel sounds normal; no masses,  no organomegaly Extremities: extremities normal, atraumatic, no cyanosis or edema Neurologic: Alert and oriented X 3, normal strength and tone. Normal symmetric reflexes. Normal coordination and gait  ECOG PERFORMANCE STATUS: 1 - Symptomatic but completely ambulatory  Blood pressure 107/73, pulse 104, temperature 98.4 F (36.9 C), temperature source Oral, resp. rate 18, height 5\' 5"  (1.651 m), weight 181 lb 14.4 oz (82.509 kg).  LABORATORY DATA: Lab Results  Component Value Date   WBC 9.5 04/09/2013   HGB 10.6* 04/09/2013   HCT 30.5* 04/09/2013   MCV 95.2 04/09/2013   PLT 282 04/09/2013      Chemistry      Component Value Date/Time   NA 136 04/09/2013 1222   NA 133* 02/14/2013 0643   K 3.7 04/09/2013 1222   K 4.0 02/14/2013 0643   CL 100 04/09/2013 1222   CL 96 02/14/2013 0643   CO2 24 04/09/2013 1222   CO2 25 02/14/2013 0643   BUN 6.6* 04/09/2013 1222   BUN 12 02/14/2013 0643   CREATININE 0.6 04/09/2013 1222   CREATININE 0.41* 02/14/2013 0643   CREATININE 0.75 11/29/2012 1601      Component Value Date/Time   CALCIUM 9.6 04/09/2013 1222   CALCIUM 9.6 02/14/2013 0643   ALKPHOS 77 04/09/2013 1222   ALKPHOS 78 02/14/2013 0643   AST 13 04/09/2013 1222   AST 15 02/14/2013 0643   ALT 14 04/09/2013 1222   ALT 23 02/14/2013 0643   BILITOT 0.43 04/09/2013 1222   BILITOT 0.4 02/14/2013 0643       RADIOGRAPHIC STUDIES: Ct Chest W Contrast  04/09/2013   *RADIOLOGY REPORT*  Clinical Data: Left lower lobe lung cancer, restaging.  Ongoing chemotherapy and radiation therapy.  Cough.  CT CHEST WITH CONTRAST  Technique:  Multidetector CT imaging of the chest was performed following the standard protocol during bolus administration of intravenous contrast.  Contrast: 80mL OMNIPAQUE IOHEXOL 300  MG/ML  SOLN  Comparison: PET CT 01/30/2013, abdomen/pelvis CT 12/06/2012  Findings: Hypodense presumed thyroid nodules are re-identified, largest 9 mm in the inferior thyroid lobe.  This is likely not significantly changed allowing for differences in technique.  Dominant subcarinal lymphadenopathy is decreased, now 0.7 cm in short axis image 25, previously 1.7 cm.  Medial left perihilar lymph node now measures 0.4 cm image 28, previously 1.7 cm.  1.1 cm low density soft tissue noted in the left hilum peripherally, compared to 1.2 cm previously.  2 mm left upper lobe granuloma is stable.  The dominant left lower lobe pulmonary nodule now measures 0.9 x 0.8 cm, with adjacent pleural thickening, decreased from previous measurement of 2.7 x 2.6 cm.  No new pulmonary mass, nodule, or consolidation is identified.  Central airways are patent.  Mild right AC  joint degenerative change.  No lytic or sclerotic osseous lesion is identified.  Thoracic spine degenerative change is noted.  IMPRESSION: Interval decrease in size of mediastinal and left hilar lymphadenopathy and dominant left lower lobe primary bronchogenic carcinoma.  Findings are compatible with response to chemotherapy/treatment.   Original Report Authenticated By: Christiana Pellant, M.D.    ASSESSMENT: This is a very pleasant 57 years old white female with limited stage small cell lung cancer status post 2 cycles of systemic chemotherapy with carboplatin and etoposide concurrent with radiation with significant improvement in her disease based on the recent CT scan of the chest.   PLAN: I personally reviewed the images of the scan and discuss the results with the patient and her mother. I recommended for the patient to proceed with systemic chemotherapy with the same regimen carboplatin for AUC of 5 on day 1 and etoposide at 120 mg/M2 on days 1, 2 and 3 with Neulasta support on day 4. The patient expected to start cycle #3 tomorrow. She would come back for  followup visit in 3 weeks with the next cycle of her chemotherapy. She was advised to call immediately if she has any concerning symptoms in the interval.  All questions were answered. The patient knows to call the clinic with any problems, questions or concerns. We can certainly see the patient much sooner if necessary.  I spent 15 minutes counseling the patient face to face. The total time spent in the appointment was 25 minutes.

## 2013-04-11 ENCOUNTER — Ambulatory Visit
Admission: RE | Admit: 2013-04-11 | Discharge: 2013-04-11 | Disposition: A | Discharge status: Home / Self Care | Payer: No Typology Code available for payment source | Source: Ambulatory Visit | Attending: Radiation Oncology | Admitting: Radiation Oncology

## 2013-04-11 ENCOUNTER — Ambulatory Visit (HOSPITAL_BASED_OUTPATIENT_CLINIC_OR_DEPARTMENT_OTHER): Payer: No Typology Code available for payment source

## 2013-04-11 ENCOUNTER — Telehealth: Payer: Self-pay | Admitting: *Deleted

## 2013-04-11 VITALS — BP 139/88 | HR 98 | Temp 99.5°F

## 2013-04-11 DIAGNOSIS — C3430 Malignant neoplasm of lower lobe, unspecified bronchus or lung: Secondary | ICD-10-CM

## 2013-04-11 DIAGNOSIS — C3492 Malignant neoplasm of unspecified part of left bronchus or lung: Secondary | ICD-10-CM

## 2013-04-11 DIAGNOSIS — Z5111 Encounter for antineoplastic chemotherapy: Secondary | ICD-10-CM

## 2013-04-11 MED ORDER — SODIUM CHLORIDE 0.9 % IV SOLN
120.0000 mg/m2 | Freq: Once | INTRAVENOUS | Status: AC
  Administered 2013-04-11: 230 mg via INTRAVENOUS
  Filled 2013-04-11: qty 11.5

## 2013-04-11 MED ORDER — DEXAMETHASONE SODIUM PHOSPHATE 20 MG/5ML IJ SOLN
20.0000 mg | Freq: Once | INTRAMUSCULAR | Status: AC
  Administered 2013-04-11: 20 mg via INTRAVENOUS

## 2013-04-11 MED ORDER — CARBOPLATIN CHEMO INJECTION 600 MG/60ML
750.0000 mg | Freq: Once | INTRAVENOUS | Status: AC
  Administered 2013-04-11: 750 mg via INTRAVENOUS
  Filled 2013-04-11: qty 75

## 2013-04-11 MED ORDER — SODIUM CHLORIDE 0.9 % IV SOLN
Freq: Once | INTRAVENOUS | Status: AC
  Administered 2013-04-11: 15:00:00 via INTRAVENOUS

## 2013-04-11 MED ORDER — ONDANSETRON 16 MG/50ML IVPB (CHCC)
16.0000 mg | Freq: Once | INTRAVENOUS | Status: AC
  Administered 2013-04-11: 16 mg via INTRAVENOUS

## 2013-04-11 NOTE — Telephone Encounter (Signed)
Pt called ; no answer - message left. Pt has an appt already scheduled w/Dr Loistine Chance on 5/27.

## 2013-04-11 NOTE — Telephone Encounter (Signed)
Please tell patient not to fill that medicine for now as it is very expensive and I am not sure she needs it. Her urinary frequency is likely from the uncontrolled diabetes. She needs to come in to be seen in clinic so that we can further discuss her diabetes management. Thanks!  Anita Gardner

## 2013-04-11 NOTE — Telephone Encounter (Signed)
Call from pt. - states Vesicare too expensive - over $200.00. Requesting least expensive medication.  Thanks

## 2013-04-11 NOTE — Patient Instructions (Signed)
[facility] Discharge Instructions for Patients Receiving Chemotherapy  Today you received the following chemotherapy agents: Carboplatin and Etoposide. To help prevent nausea and vomiting after your treatment, we encourage you to take your nausea medication, Compazine.  Take it every six hours as needed. May make you drowsy.   If you develop nausea and vomiting that is not controlled by your nausea medication, call the clinic. If it is after clinic hours your family physician or the after hours number for the clinic or go to the Emergency Department.   BELOW ARE SYMPTOMS THAT SHOULD BE REPORTED IMMEDIATELY:  *FEVER GREATER THAN 100.5 F  *CHILLS WITH OR WITHOUT FEVER  NAUSEA AND VOMITING THAT IS NOT CONTROLLED WITH YOUR NAUSEA MEDICATION  *UNUSUAL SHORTNESS OF BREATH  *UNUSUAL BRUISING OR BLEEDING  TENDERNESS IN MOUTH AND THROAT WITH OR WITHOUT PRESENCE OF ULCERS  *URINARY PROBLEMS  *BOWEL PROBLEMS  UNUSUAL RASH Items with * indicate a potential emergency and should be followed up as soon as possible.  Feel free to call the clinic you have any questions or concerns. The clinic phone number is [phone].   I have been informed and understand all the instructions given to me. I know to contact the clinic, my physician, or go to the Emergency Department if any problems should occur. I do not have any questions at this time, but understand that I may call the clinic during office hours   should I have any questions or need assistance in obtaining follow up care.

## 2013-04-12 ENCOUNTER — Ambulatory Visit
Admission: RE | Admit: 2013-04-12 | Discharge: 2013-04-12 | Disposition: A | Discharge status: Home / Self Care | Payer: No Typology Code available for payment source | Source: Ambulatory Visit | Attending: Radiation Oncology | Admitting: Radiation Oncology

## 2013-04-12 ENCOUNTER — Encounter: Payer: Self-pay | Admitting: Radiation Oncology

## 2013-04-12 ENCOUNTER — Ambulatory Visit (HOSPITAL_BASED_OUTPATIENT_CLINIC_OR_DEPARTMENT_OTHER): Payer: No Typology Code available for payment source

## 2013-04-12 VITALS — BP 110/69 | HR 86 | Temp 98.0°F

## 2013-04-12 DIAGNOSIS — C3492 Malignant neoplasm of unspecified part of left bronchus or lung: Secondary | ICD-10-CM

## 2013-04-12 DIAGNOSIS — Z5111 Encounter for antineoplastic chemotherapy: Secondary | ICD-10-CM

## 2013-04-12 DIAGNOSIS — C3410 Malignant neoplasm of upper lobe, unspecified bronchus or lung: Secondary | ICD-10-CM

## 2013-04-12 MED ORDER — DEXAMETHASONE SODIUM PHOSPHATE 10 MG/ML IJ SOLN
10.0000 mg | Freq: Once | INTRAMUSCULAR | Status: AC
  Administered 2013-04-12: 10 mg via INTRAVENOUS

## 2013-04-12 MED ORDER — SODIUM CHLORIDE 0.9 % IV SOLN
120.0000 mg/m2 | Freq: Once | INTRAVENOUS | Status: AC
  Administered 2013-04-12: 230 mg via INTRAVENOUS
  Filled 2013-04-12: qty 11.5

## 2013-04-12 MED ORDER — ONDANSETRON 8 MG/50ML IVPB (CHCC)
8.0000 mg | Freq: Once | INTRAVENOUS | Status: AC
  Administered 2013-04-12: 8 mg via INTRAVENOUS

## 2013-04-12 MED ORDER — SODIUM CHLORIDE 0.9 % IV SOLN
Freq: Once | INTRAVENOUS | Status: AC
  Administered 2013-04-12: 15:00:00 via INTRAVENOUS

## 2013-04-12 NOTE — Telephone Encounter (Signed)
Thanks

## 2013-04-12 NOTE — Patient Instructions (Addendum)
[facility] Discharge Instructions for Patients Receiving Chemotherapy  Today you received the following chemotherapy agents Etoposide.  To help prevent nausea and vomiting after your treatment, we encourage you to take your nausea medication as prescribed.   If you develop nausea and vomiting that is not controlled by your nausea medication, call the clinic. If it is after clinic hours your family physician or the after hours number for the clinic or go to the Emergency Department.   BELOW ARE SYMPTOMS THAT SHOULD BE REPORTED IMMEDIATELY:  *FEVER GREATER THAN 100.5 F  *CHILLS WITH OR WITHOUT FEVER  NAUSEA AND VOMITING THAT IS NOT CONTROLLED WITH YOUR NAUSEA MEDICATION  *UNUSUAL SHORTNESS OF BREATH  *UNUSUAL BRUISING OR BLEEDING  TENDERNESS IN MOUTH AND THROAT WITH OR WITHOUT PRESENCE OF ULCERS  *URINARY PROBLEMS  *BOWEL PROBLEMS  UNUSUAL RASH Items with * indicate a potential emergency and should be followed up as soon as possible.  Feel free to call the clinic you have any questions or concerns. The clinic phone number is [phone].   I have been informed and understand all the instructions given to me. I know to contact the clinic, my physician, or go to the Emergency Department if any problems should occur. I do not have any questions at this time, but understand that I may call the clinic during office hours   should I have any questions or need assistance in obtaining follow up care.    __________________________________________  _____________  __________ Signature of Patient or Authorized Representative            Date                   Time    __________________________________________ Nurse's Signature    

## 2013-04-13 ENCOUNTER — Ambulatory Visit: Payer: No Typology Code available for payment source

## 2013-04-13 ENCOUNTER — Ambulatory Visit (HOSPITAL_BASED_OUTPATIENT_CLINIC_OR_DEPARTMENT_OTHER): Payer: No Typology Code available for payment source

## 2013-04-13 VITALS — BP 115/75 | HR 101 | Temp 98.0°F

## 2013-04-13 DIAGNOSIS — Z5111 Encounter for antineoplastic chemotherapy: Secondary | ICD-10-CM

## 2013-04-13 DIAGNOSIS — C3492 Malignant neoplasm of unspecified part of left bronchus or lung: Secondary | ICD-10-CM

## 2013-04-13 DIAGNOSIS — C3430 Malignant neoplasm of lower lobe, unspecified bronchus or lung: Secondary | ICD-10-CM

## 2013-04-13 MED ORDER — SODIUM CHLORIDE 0.9 % IV SOLN
120.0000 mg/m2 | Freq: Once | INTRAVENOUS | Status: AC
  Administered 2013-04-13: 230 mg via INTRAVENOUS
  Filled 2013-04-13: qty 11.5

## 2013-04-13 MED ORDER — DEXAMETHASONE SODIUM PHOSPHATE 10 MG/ML IJ SOLN
10.0000 mg | Freq: Once | INTRAMUSCULAR | Status: AC
  Administered 2013-04-13: 10 mg via INTRAVENOUS

## 2013-04-13 MED ORDER — SODIUM CHLORIDE 0.9 % IV SOLN
Freq: Once | INTRAVENOUS | Status: AC
  Administered 2013-04-13: 13:00:00 via INTRAVENOUS

## 2013-04-13 MED ORDER — ONDANSETRON 8 MG/50ML IVPB (CHCC)
8.0000 mg | Freq: Once | INTRAVENOUS | Status: AC
  Administered 2013-04-13: 8 mg via INTRAVENOUS

## 2013-04-13 NOTE — Patient Instructions (Addendum)
[facility] Discharge Instructions for Patients Receiving Chemotherapy  Today you received the following chemotherapy agents Etoposide.  To help prevent nausea and vomiting after your treatment, we encourage you to take your nausea medication as prescribed.   If you develop nausea and vomiting that is not controlled by your nausea medication, call the clinic. If it is after clinic hours your family physician or the after hours number for the clinic or go to the Emergency Department.   BELOW ARE SYMPTOMS THAT SHOULD BE REPORTED IMMEDIATELY:  *FEVER GREATER THAN 100.5 F  *CHILLS WITH OR WITHOUT FEVER  NAUSEA AND VOMITING THAT IS NOT CONTROLLED WITH YOUR NAUSEA MEDICATION  *UNUSUAL SHORTNESS OF BREATH  *UNUSUAL BRUISING OR BLEEDING  TENDERNESS IN MOUTH AND THROAT WITH OR WITHOUT PRESENCE OF ULCERS  *URINARY PROBLEMS  *BOWEL PROBLEMS  UNUSUAL RASH Items with * indicate a potential emergency and should be followed up as soon as possible.  Feel free to call the clinic you have any questions or concerns. The clinic phone number is [phone].   I have been informed and understand all the instructions given to me. I know to contact the clinic, my physician, or go to the Emergency Department if any problems should occur. I do not have any questions at this time, but understand that I may call the clinic during office hours   should I have any questions or need assistance in obtaining follow up care.    __________________________________________  _____________  __________ Signature of Patient or Authorized Representative            Date                   Time    __________________________________________ Nurse's Signature    

## 2013-04-14 ENCOUNTER — Ambulatory Visit (HOSPITAL_BASED_OUTPATIENT_CLINIC_OR_DEPARTMENT_OTHER): Payer: No Typology Code available for payment source

## 2013-04-14 VITALS — BP 129/79 | HR 78 | Temp 98.8°F

## 2013-04-14 DIAGNOSIS — C3430 Malignant neoplasm of lower lobe, unspecified bronchus or lung: Secondary | ICD-10-CM

## 2013-04-14 DIAGNOSIS — Z5189 Encounter for other specified aftercare: Secondary | ICD-10-CM

## 2013-04-14 DIAGNOSIS — C3492 Malignant neoplasm of unspecified part of left bronchus or lung: Secondary | ICD-10-CM

## 2013-04-14 MED ORDER — PEGFILGRASTIM INJECTION 6 MG/0.6ML
6.0000 mg | Freq: Once | SUBCUTANEOUS | Status: AC
  Administered 2013-04-14: 6 mg via SUBCUTANEOUS

## 2013-04-16 NOTE — Progress Notes (Signed)
  Radiation Oncology         [phone] ________________________________  Name: Anita Gardner MRN: [id number]  Date: 04/12/2013  DOB: February 01, 1956  End of Treatment Note  Diagnosis:  Stage IIIA Small Cell Lung Cancer   Indication for treatment: Curative      Radiation treatment dates:   03/01/2013-04/12/2013  Site/dose:   Left Lung and Nodes/ 62 Gy in 31 fractions  Beams/energy:   3D conformal / 6 and 10 MV photons  Narrative: The patient tolerated radiation treatment relatively well.   She developed some mild esophagitis.  Plan: The patient has completed radiation treatment. The patient will return to radiation oncology clinic for routine followup in one month. I advised them to call or return sooner if they have any questions or concerns related to their recovery or treatment.  -----------------------------------  Lonie Peak, MD

## 2013-04-16 NOTE — Progress Notes (Signed)
INTERNAL MEDICINE TEACHING ATTENDING ADDENDUM: I discussed this case with Dr. Kesty soon after the patient visit. I have read the documentation and I agree with the plan of care. Please see the resident note for details of management. 

## 2013-04-17 ENCOUNTER — Ambulatory Visit: Payer: No Typology Code available for payment source | Admitting: Internal Medicine

## 2013-04-18 ENCOUNTER — Other Ambulatory Visit (HOSPITAL_BASED_OUTPATIENT_CLINIC_OR_DEPARTMENT_OTHER): Payer: No Typology Code available for payment source

## 2013-04-18 DIAGNOSIS — C3492 Malignant neoplasm of unspecified part of left bronchus or lung: Secondary | ICD-10-CM

## 2013-04-18 DIAGNOSIS — C3430 Malignant neoplasm of lower lobe, unspecified bronchus or lung: Secondary | ICD-10-CM

## 2013-04-18 LAB — CBC WITH DIFFERENTIAL/PLATELET
Basophils Absolute: 0.1 10*3/uL (ref 0.0–0.1)
Eosinophils Absolute: 0 10*3/uL (ref 0.0–0.5)
HCT: 29.1 % — ABNORMAL LOW (ref 34.8–46.6)
HGB: 10.1 g/dL — ABNORMAL LOW (ref 11.6–15.9)
LYMPH%: 2.5 % — ABNORMAL LOW (ref 14.0–49.7)
MONO#: 0.4 10*3/uL (ref 0.1–0.9)
NEUT#: 10.9 10*3/uL — ABNORMAL HIGH (ref 1.5–6.5)
NEUT%: 93.5 % — ABNORMAL HIGH (ref 38.4–76.8)
Platelets: 280 10*3/uL (ref 145–400)
WBC: 11.7 10*3/uL — ABNORMAL HIGH (ref 3.9–10.3)

## 2013-04-18 LAB — COMPREHENSIVE METABOLIC PANEL (CC13)
BUN: 10.1 mg/dL (ref 7.0–26.0)
CO2: 23 mEq/L (ref 22–29)
Creatinine: 0.6 mg/dL (ref 0.6–1.1)
Glucose: 149 mg/dl — ABNORMAL HIGH (ref 70–99)
Sodium: 137 mEq/L (ref 136–145)
Total Bilirubin: 0.86 mg/dL (ref 0.20–1.20)
Total Protein: 6.7 g/dL (ref 6.4–8.3)

## 2013-04-23 ENCOUNTER — Other Ambulatory Visit (HOSPITAL_BASED_OUTPATIENT_CLINIC_OR_DEPARTMENT_OTHER): Payer: No Typology Code available for payment source | Admitting: Lab

## 2013-04-23 DIAGNOSIS — C3492 Malignant neoplasm of unspecified part of left bronchus or lung: Secondary | ICD-10-CM

## 2013-04-23 DIAGNOSIS — C3490 Malignant neoplasm of unspecified part of unspecified bronchus or lung: Secondary | ICD-10-CM

## 2013-04-23 LAB — CBC WITH DIFFERENTIAL/PLATELET
Eosinophils Absolute: 0 10*3/uL (ref 0.0–0.5)
LYMPH%: 3.4 % — ABNORMAL LOW (ref 14.0–49.7)
MONO#: 0.8 10*3/uL (ref 0.1–0.9)
NEUT#: 7.5 10*3/uL — ABNORMAL HIGH (ref 1.5–6.5)
Platelets: 60 10*3/uL — ABNORMAL LOW (ref 145–400)
RBC: 2.85 10*6/uL — ABNORMAL LOW (ref 3.70–5.45)
WBC: 8.6 10*3/uL (ref 3.9–10.3)

## 2013-04-23 LAB — COMPREHENSIVE METABOLIC PANEL (CC13)
Albumin: 3.5 g/dL (ref 3.5–5.0)
CO2: 25 mEq/L (ref 22–29)
Calcium: 9 mg/dL (ref 8.4–10.4)
Glucose: 292 mg/dl — ABNORMAL HIGH (ref 70–99)
Potassium: 3.5 mEq/L (ref 3.5–5.1)
Sodium: 137 mEq/L (ref 136–145)
Total Protein: 6.6 g/dL (ref 6.4–8.3)

## 2013-04-23 NOTE — Progress Notes (Signed)
Quick Note:  Call patient with the result and arrange for 2 units of PRBCs transfusion. ______ 

## 2013-04-24 ENCOUNTER — Telehealth: Payer: Self-pay | Admitting: *Deleted

## 2013-04-24 ENCOUNTER — Ambulatory Visit: Payer: No Typology Code available for payment source

## 2013-04-24 NOTE — Telephone Encounter (Signed)
Per Dr Donnald Garre, pt does not need blood with hbg 9.6, erroneous in basket message.  SLJ

## 2013-04-24 NOTE — Telephone Encounter (Signed)
Message copied by Caren Griffins on Tue Apr 24, 2013 11:24 AM ------      Message from: Anita Gardner      Created: Mon Apr 23, 2013  3:04 PM       Call patient with the result and arrange for 2 units of PRBCs transfusion. ------

## 2013-04-25 ENCOUNTER — Encounter: Payer: Self-pay | Admitting: Medical Oncology

## 2013-04-25 ENCOUNTER — Ambulatory Visit: Payer: Self-pay

## 2013-04-27 ENCOUNTER — Encounter: Payer: Self-pay | Admitting: Internal Medicine

## 2013-04-27 NOTE — Progress Notes (Signed)
Left msg for Anita Gardner's supervisor [place] at [phone].  I was told previously that pt is approved for Medicaid but she never received a card.  I looked her up on WebMD and it shows inactive.  Requested she return my call to find out the status of pt's Medicaid.

## 2013-04-30 ENCOUNTER — Encounter: Payer: Self-pay | Admitting: Internal Medicine

## 2013-04-30 ENCOUNTER — Ambulatory Visit: Payer: Self-pay | Admitting: Lab

## 2013-04-30 ENCOUNTER — Ambulatory Visit (HOSPITAL_BASED_OUTPATIENT_CLINIC_OR_DEPARTMENT_OTHER): Payer: Medicaid Other | Admitting: Internal Medicine

## 2013-04-30 ENCOUNTER — Encounter (HOSPITAL_COMMUNITY)
Admission: RE | Admit: 2013-04-30 | Discharge: 2013-04-30 | Disposition: A | Discharge status: Home / Self Care | Payer: No Typology Code available for payment source | Source: Ambulatory Visit | Attending: Internal Medicine | Admitting: Internal Medicine

## 2013-04-30 ENCOUNTER — Ambulatory Visit (HOSPITAL_BASED_OUTPATIENT_CLINIC_OR_DEPARTMENT_OTHER): Payer: Medicaid Other

## 2013-04-30 ENCOUNTER — Other Ambulatory Visit (HOSPITAL_BASED_OUTPATIENT_CLINIC_OR_DEPARTMENT_OTHER): Payer: Medicaid Other | Admitting: Lab

## 2013-04-30 ENCOUNTER — Other Ambulatory Visit: Payer: Self-pay | Admitting: Physician Assistant

## 2013-04-30 VITALS — BP 130/72 | HR 80 | Temp 98.6°F | Resp 20

## 2013-04-30 VITALS — BP 123/75 | HR 76 | Temp 97.5°F | Resp 18 | Ht 65.0 in | Wt 183.6 lb

## 2013-04-30 DIAGNOSIS — C3430 Malignant neoplasm of lower lobe, unspecified bronchus or lung: Secondary | ICD-10-CM

## 2013-04-30 DIAGNOSIS — D6481 Anemia due to antineoplastic chemotherapy: Secondary | ICD-10-CM

## 2013-04-30 DIAGNOSIS — T451X5A Adverse effect of antineoplastic and immunosuppressive drugs, initial encounter: Secondary | ICD-10-CM

## 2013-04-30 DIAGNOSIS — C3492 Malignant neoplasm of unspecified part of left bronchus or lung: Secondary | ICD-10-CM

## 2013-04-30 DIAGNOSIS — Z5111 Encounter for antineoplastic chemotherapy: Secondary | ICD-10-CM

## 2013-04-30 DIAGNOSIS — C3490 Malignant neoplasm of unspecified part of unspecified bronchus or lung: Secondary | ICD-10-CM

## 2013-04-30 LAB — CBC WITH DIFFERENTIAL/PLATELET
Eosinophils Absolute: 0 10*3/uL (ref 0.0–0.5)
MONO#: 0.5 10*3/uL (ref 0.1–0.9)
NEUT#: 6 10*3/uL (ref 1.5–6.5)
RBC: 2.43 10*6/uL — ABNORMAL LOW (ref 3.70–5.45)
RDW: 19.1 % — ABNORMAL HIGH (ref 11.2–14.5)
WBC: 6.9 10*3/uL (ref 3.9–10.3)
nRBC: 0 % (ref 0–0)

## 2013-04-30 LAB — HOLD TUBE, BLOOD BANK

## 2013-04-30 LAB — COMPREHENSIVE METABOLIC PANEL (CC13)
ALT: 17 U/L (ref 0–55)
AST: 24 U/L (ref 5–34)
CO2: 24 mEq/L (ref 22–29)
Calcium: 9.3 mg/dL (ref 8.4–10.4)
Chloride: 102 mEq/L (ref 98–107)
Sodium: 138 mEq/L (ref 136–145)
Total Bilirubin: 0.51 mg/dL (ref 0.20–1.20)
Total Protein: 6.8 g/dL (ref 6.4–8.3)

## 2013-04-30 LAB — PREPARE RBC (CROSSMATCH)

## 2013-04-30 MED ORDER — SODIUM CHLORIDE 0.9 % IV SOLN: 250.0000 mL | Freq: Once | INTRAVENOUS | Status: DC

## 2013-04-30 MED ORDER — SODIUM CHLORIDE 0.9 % IJ SOLN
10.0000 mL | INTRAMUSCULAR | Status: DC | PRN
  Filled 2013-04-30: qty 10

## 2013-04-30 MED ORDER — SODIUM CHLORIDE 0.9 % IV SOLN: Freq: Once | INTRAVENOUS | Status: DC

## 2013-04-30 MED ORDER — ONDANSETRON 16 MG/50ML IVPB (CHCC)
16.0000 mg | Freq: Once | INTRAVENOUS | Status: AC
  Administered 2013-04-30: 16 mg via INTRAVENOUS

## 2013-04-30 MED ORDER — DEXAMETHASONE SODIUM PHOSPHATE 20 MG/5ML IJ SOLN
20.0000 mg | Freq: Once | INTRAMUSCULAR | Status: AC
  Administered 2013-04-30: 20 mg via INTRAVENOUS

## 2013-04-30 MED ORDER — HEPARIN SOD (PORK) LOCK FLUSH 100 UNIT/ML IV SOLN
500.0000 [IU] | Freq: Every day | INTRAVENOUS | Status: DC | PRN
  Filled 2013-04-30: qty 5

## 2013-04-30 MED ORDER — SODIUM CHLORIDE 0.9 % IV SOLN
120.0000 mg/m2 | Freq: Once | INTRAVENOUS | Status: AC
  Administered 2013-04-30: 230 mg via INTRAVENOUS
  Filled 2013-04-30: qty 11.5

## 2013-04-30 MED ORDER — SODIUM CHLORIDE 0.9 % IV SOLN
722.0000 mg | Freq: Once | INTRAVENOUS | Status: AC
  Administered 2013-04-30: 720 mg via INTRAVENOUS
  Filled 2013-04-30: qty 72

## 2013-04-30 MED ORDER — ACETAMINOPHEN 325 MG PO TABS
650.0000 mg | ORAL_TABLET | Freq: Once | ORAL | Status: AC
  Administered 2013-04-30: 650 mg via ORAL

## 2013-04-30 MED ORDER — DIPHENHYDRAMINE HCL 50 MG/ML IJ SOLN
25.0000 mg | Freq: Once | INTRAMUSCULAR | Status: AC
  Administered 2013-04-30: 25 mg via INTRAVENOUS

## 2013-04-30 NOTE — Progress Notes (Signed)
OK to treat per Dr. Truett Perna - Hgb = 7.9.

## 2013-04-30 NOTE — Progress Notes (Signed)
[facility] Telephone:(336) [phone]   Fax:(336) [phone]  OFFICE PROGRESS NOTE  Anita Abraham, DO [address] [place] [address]  DIAGNOSIS: Limited stage small cell lung cancer diagnosed in March of 2014.   PRIOR THERAPY: None   CURRENT THERAPY: Systemic chemotherapy with carboplatin for AUC of 5 on day 1 and etoposide 120 mg/M2 days 1, 2 and 3 with Neulasta support on day 4. The patient is status post 3 cycles. This is concurrent with radiation.  INTERVAL HISTORY: Anita Gardner 57 y.o. female returns to the clinic today for followup visit accompanied by her mother. The patient is feeling fine today with no specific complaints except for mild fatigue but she denied having any significant chest pain, shortness breath, cough or hemoptysis. She denied having any significant weight loss or night sweats. She is tolerating her treatment with systemic chemotherapy fairly well.  MEDICAL HISTORY: Past Medical History  Diagnosis Date  . Chronic pain of left knee     secondary to arthritis  . Hypertension   . Diabetic peripheral neuropathy   . GERD (gastroesophageal reflux disease)   . Diabetes mellitus type 2, uncontrolled, without complications DX: 2000    on oral medications only  . Constipation   . Anxiety   . Depression   . Panic attacks   . Bipolar 1 disorder   . COPD (chronic obstructive pulmonary disease)   . 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 Tyrone Sage (CVTS), Dr. Gwenyth Bouillon (Onc) // LLL mass noted 11/2012// PET scan 12/2012 - mediastinal + left infrahilar nodal metastasis AND low left mediastinum or posterior left pericardium is hypermetabolic likely nodal metastasis // Stage IIIA, T1bN2MX at diagnosis // Bronchoscopy 01/2013 confirmed small cell ca. // To start chemo/rad 02/2013    ALLERGIES:  is allergic to ampicillin and codeine.  MEDICATIONS:  Current Outpatient Prescriptions  Medication Sig Dispense Refill    . Cholecalciferol (VITAMIN D-3) 5000 UNITS TABS Take 5,000 Units by mouth daily.      Marland Kitchen glyBURIDE (DIABETA) 5 MG tablet Take 1 tablet (5 mg total) by mouth 2 (two) times daily with a meal.  180 tablet  1  . metFORMIN (GLUCOPHAGE) 1000 MG tablet Take 1 tablet (1,000 mg total) by mouth 2 (two) times daily with a meal.  180 tablet  1  . Multiple Vitamin (MULTIVITAMIN WITH MINERALS) TABS Take 1 tablet by mouth daily.      . Omega-3 Fatty Acids (FISH OIL) 1200 MG CAPS Take 1,200 mg by mouth daily.      . ranitidine (ZANTAC) 150 MG tablet Take 1 tablet (150 mg total) by mouth 2 (two) times daily.  60 tablet  5  . solifenacin (VESICARE) 10 MG tablet Take 1 tablet (10 mg total) by mouth daily.  30 tablet  4  . sucralfate (CARAFATE) 1 GM/10ML suspension Take 10 mLs (1 g total) by mouth 4 (four) times daily.  420 mL  1  . prochlorperazine (COMPAZINE) 10 MG tablet Take 1 tablet (10 mg total) by mouth every 6 (six) hours as needed.  60 tablet  0   No current facility-administered medications for this visit.    SURGICAL HISTORY:  Past Surgical History  Procedure Laterality Date  . Left leg surgery      as a child  . Video bronchoscopy with endobronchial ultrasound N/A 02/14/2013    Procedure: VIDEO BRONCHOSCOPY WITH ENDOBRONCHIAL ULTRASOUND;  Surgeon: Delight Ovens, MD;  Location: [facility] OR;  Service: Thoracic;  Laterality: N/A;    REVIEW OF SYSTEMS:  A comprehensive review of systems was negative except for: Constitutional: positive for fatigue   PHYSICAL EXAMINATION: General appearance: alert, cooperative, fatigued and no distress Head: Normocephalic, without obvious abnormality, atraumatic Neck: no adenopathy Lymph nodes: Cervical, supraclavicular, and axillary nodes normal. Resp: clear to auscultation bilaterally Cardio: regular rate and rhythm, S1, S2 normal, no murmur, click, rub or gallop GI: soft, non-tender; bowel sounds normal; no masses,  no organomegaly Extremities: extremities normal,  atraumatic, no cyanosis or edema Neurologic: Alert and oriented X 3, normal strength and tone. Normal symmetric reflexes. Normal coordination and gait  ECOG PERFORMANCE STATUS: 1 - Symptomatic but completely ambulatory  Blood pressure 123/75, pulse 76, temperature 97.5 F (36.4 C), temperature source Oral, resp. rate 18, height 5\' 5"  (1.651 m), weight 183 lb 9.6 oz (83.28 kg).  LABORATORY DATA: Lab Results  Component Value Date   WBC 6.9 04/30/2013   HGB 7.9* 04/30/2013   HCT 24.0* 04/30/2013   MCV 98.8 04/30/2013   PLT 135* 04/30/2013      Chemistry      Component Value Date/Time   NA 137 04/23/2013 1240   NA 133* 02/14/2013 0643   K 3.5 04/23/2013 1240   K 4.0 02/14/2013 0643   CL 102 04/23/2013 1240   CL 96 02/14/2013 0643   CO2 25 04/23/2013 1240   CO2 25 02/14/2013 0643   BUN 6.6* 04/23/2013 1240   BUN 12 02/14/2013 0643   CREATININE 0.7 04/23/2013 1240   CREATININE 0.41* 02/14/2013 0643   CREATININE 0.75 11/29/2012 1601      Component Value Date/Time   CALCIUM 9.0 04/23/2013 1240   CALCIUM 9.6 02/14/2013 0643   ALKPHOS 94 04/23/2013 1240   ALKPHOS 78 02/14/2013 0643   AST 11 04/23/2013 1240   AST 15 02/14/2013 0643   ALT 16 04/23/2013 1240   ALT 23 02/14/2013 0643   BILITOT 0.89 04/23/2013 1240   BILITOT 0.4 02/14/2013 0643       RADIOGRAPHIC STUDIES: Ct Chest W Contrast  04/09/2013   *RADIOLOGY REPORT*  Clinical Data: Left lower lobe lung cancer, restaging.  Ongoing chemotherapy and radiation therapy.  Cough.  CT CHEST WITH CONTRAST  Technique:  Multidetector CT imaging of the chest was performed following the standard protocol during bolus administration of intravenous contrast.  Contrast: 80mL OMNIPAQUE IOHEXOL 300 MG/ML  SOLN  Comparison: PET CT 01/30/2013, abdomen/pelvis CT 12/06/2012  Findings: Hypodense presumed thyroid nodules are re-identified, largest 9 mm in the inferior thyroid lobe.  This is likely not significantly changed allowing for differences in technique.  Dominant subcarinal  lymphadenopathy is decreased, now 0.7 cm in short axis image 25, previously 1.7 cm.  Medial left perihilar lymph node now measures 0.4 cm image 28, previously 1.7 cm.  1.1 cm low density soft tissue noted in the left hilum peripherally, compared to 1.2 cm previously.  2 mm left upper lobe granuloma is stable.  The dominant left lower lobe pulmonary nodule now measures 0.9 x 0.8 cm, with adjacent pleural thickening, decreased from previous measurement of 2.7 x 2.6 cm.  No new pulmonary mass, nodule, or consolidation is identified.  Central airways are patent.  Mild right AC joint degenerative change.  No lytic or sclerotic osseous lesion is identified.  Thoracic spine degenerative change is noted.  IMPRESSION: Interval decrease in size of mediastinal and left hilar lymphadenopathy and dominant left lower lobe primary bronchogenic carcinoma.  Findings  are compatible with response to chemotherapy/treatment.   Original Report Authenticated By: Christiana Pellant, M.D.    ASSESSMENT: This is a very pleasant 57 years old white female with limited stage small cell lung cancer currently undergoing systemic chemotherapy with carboplatin and etoposide status post 3 cycles.  PLAN: I recommended for the patient to proceed with cycle #4 today as scheduled. I would see her back for followup visit in 3 weeks with repeat CT scan of the chest for restaging of her disease. For the chemotherapy-induced anemia, I will arrange for the patient to receive 2 units of PRBCs. She was advised to call immediately if she has any concerning symptoms in the interval.  All questions were answered. The patient knows to call the clinic with any problems, questions or concerns. We can certainly see the patient much sooner if necessary.  I spent 15 minutes counseling the patient face to face. The total time spent in the appointment was 25 minutes.

## 2013-04-30 NOTE — Addendum Note (Signed)
Addended by: Charma Igo on: 04/30/2013 12:42 PM   Modules accepted: Orders, SmartSet

## 2013-04-30 NOTE — Patient Instructions (Addendum)
[facility] Discharge Instructions for Patients Receiving Chemotherapy  Today you received the following chemotherapy agents: carboplatin, etoposide  To help prevent nausea and vomiting after your treatment, we encourage you to take your nausea medication.  Take it as often as prescribed.     If you develop nausea and vomiting that is not controlled by your nausea medication, call the clinic. If it is after clinic hours your family physician or the after hours number for the clinic or go to the Emergency Department.   BELOW ARE SYMPTOMS THAT SHOULD BE REPORTED IMMEDIATELY:  *FEVER GREATER THAN 100.5 F  *CHILLS WITH OR WITHOUT FEVER  NAUSEA AND VOMITING THAT IS NOT CONTROLLED WITH YOUR NAUSEA MEDICATION  *UNUSUAL SHORTNESS OF BREATH  *UNUSUAL BRUISING OR BLEEDING  TENDERNESS IN MOUTH AND THROAT WITH OR WITHOUT PRESENCE OF ULCERS  *URINARY PROBLEMS  *BOWEL PROBLEMS  UNUSUAL RASH Items with * indicate a potential emergency and should be followed up as soon as possible.  Feel free to call the clinic you have any questions or concerns. The clinic phone number is [phone].   I have been informed and understand all the instructions given to me. I know to contact the clinic, my physician, or go to the Emergency Department if any problems should occur. I do not have any questions at this time, but understand that I may call the clinic during office hours   should I have any questions or need assistance in obtaining follow up care.    __________________________________________  _____________  __________ Signature of Patient or Authorized Representative            Date                   Time    __________________________________________ Nurse's Signature  Blood Transfusion Information WHAT IS A BLOOD TRANSFUSION? A transfusion is the replacement of blood or some of its parts. Blood is made up of multiple cells which provide different functions.  Red blood  cells carry oxygen and are used for blood loss replacement.  White blood cells fight against infection.  Platelets control bleeding.  Plasma helps clot blood.  Other blood products are available for specialized needs, such as hemophilia or other clotting disorders. BEFORE THE TRANSFUSION  Who gives blood for transfusions?   You may be able to donate blood to be used at a later date on yourself (autologous donation).  Relatives can be asked to donate blood. This is generally not any safer than if you have received blood from a stranger. The same precautions are taken to ensure safety when a relative's blood is donated.  Healthy volunteers who are fully evaluated to make sure their blood is safe. This is blood bank blood. Transfusion therapy is the safest it has ever been in the practice of medicine. Before blood is taken from a donor, a complete history is taken to make sure that person has no history of diseases nor engages in risky social behavior (examples are intravenous drug use or sexual activity with multiple partners). The donor's travel history is screened to minimize risk of transmitting infections, such as malaria. The donated blood is tested for signs of infectious diseases, such as HIV and hepatitis. The blood is then tested to be sure it is compatible with you in order to minimize the chance of a transfusion reaction. If you or a relative donates blood, this is often done in anticipation of surgery and is not appropriate for emergency situations.  It takes many days to process the donated blood. RISKS AND COMPLICATIONS Although transfusion therapy is very safe and saves many lives, the main dangers of transfusion include:   Getting an infectious disease.  Developing a transfusion reaction. This is an allergic reaction to something in the blood you were given. Every precaution is taken to prevent this. The decision to have a blood transfusion has been considered carefully by your  caregiver before blood is given. Blood is not given unless the benefits outweigh the risks. AFTER THE TRANSFUSION  Right after receiving a blood transfusion, you will usually feel much better and more energetic. This is especially true if your red blood cells have gotten low (anemic). The transfusion raises the level of the red blood cells which carry oxygen, and this usually causes an energy increase.  The nurse administering the transfusion will monitor you carefully for complications. HOME CARE INSTRUCTIONS  No special instructions are needed after a transfusion. You may find your energy is better. Speak with your caregiver about any limitations on activity for underlying diseases you may have. SEEK MEDICAL CARE IF:   Your condition is not improving after your transfusion.  You develop redness or irritation at the intravenous (IV) site. SEEK IMMEDIATE MEDICAL CARE IF:  Any of the following symptoms occur over the next 12 hours:  Shaking chills.  You have a temperature by mouth above 102 F (38.9 C), not controlled by medicine.  Chest, back, or muscle pain.  People around you feel you are not acting correctly or are confused.  Shortness of breath or difficulty breathing.  Dizziness and fainting.  You get a rash or develop hives.  You have a decrease in urine output.  Your urine turns a dark color or changes to pink, red, or brown. Any of the following symptoms occur over the next 10 days:  You have a temperature by mouth above 102 F (38.9 C), not controlled by medicine.  Shortness of breath.  Weakness after normal activity.  The white part of the eye turns yellow (jaundice).  You have a decrease in the amount of urine or are urinating less often.  Your urine turns a dark color or changes to pink, red, or brown. Document Released: 11/05/2000 Document Revised: 01/31/2012 Document Reviewed: 06/24/2008 [facility] Patient Information 2014 [place], [place].

## 2013-04-30 NOTE — Progress Notes (Signed)
Per Dr. Arbutus Ped it is okay to treat pt today with chemotherapy and low HGB. lLood transfusion scheduled for today.

## 2013-04-30 NOTE — Patient Instructions (Signed)
Continue chemotherapy today as scheduled. I will arrange for a blood transfusion for the anemia. Followup visit in 3 weeks with repeat CT scan of the chest.

## 2013-05-01 ENCOUNTER — Telehealth: Payer: Self-pay | Admitting: *Deleted

## 2013-05-01 ENCOUNTER — Ambulatory Visit (HOSPITAL_BASED_OUTPATIENT_CLINIC_OR_DEPARTMENT_OTHER): Payer: Medicaid Other

## 2013-05-01 ENCOUNTER — Other Ambulatory Visit: Payer: Self-pay | Admitting: Internal Medicine

## 2013-05-01 ENCOUNTER — Ambulatory Visit: Payer: Self-pay

## 2013-05-01 ENCOUNTER — Encounter: Payer: Self-pay | Admitting: Dietician

## 2013-05-01 VITALS — BP 123/79 | HR 80 | Temp 98.7°F | Resp 18

## 2013-05-01 DIAGNOSIS — C3430 Malignant neoplasm of lower lobe, unspecified bronchus or lung: Secondary | ICD-10-CM

## 2013-05-01 DIAGNOSIS — D6481 Anemia due to antineoplastic chemotherapy: Secondary | ICD-10-CM

## 2013-05-01 DIAGNOSIS — Z5111 Encounter for antineoplastic chemotherapy: Secondary | ICD-10-CM

## 2013-05-01 DIAGNOSIS — C3490 Malignant neoplasm of unspecified part of unspecified bronchus or lung: Secondary | ICD-10-CM

## 2013-05-01 MED ORDER — SODIUM CHLORIDE 0.9 % IV SOLN
Freq: Once | INTRAVENOUS | Status: AC
  Administered 2013-05-01: 11:00:00 via INTRAVENOUS

## 2013-05-01 MED ORDER — ONDANSETRON 8 MG/50ML IVPB (CHCC)
8.0000 mg | Freq: Once | INTRAVENOUS | Status: AC
  Administered 2013-05-01: 8 mg via INTRAVENOUS

## 2013-05-01 MED ORDER — SODIUM CHLORIDE 0.9 % IV SOLN
120.0000 mg/m2 | Freq: Once | INTRAVENOUS | Status: AC
  Administered 2013-05-01: 230 mg via INTRAVENOUS
  Filled 2013-05-01: qty 11.5

## 2013-05-01 MED ORDER — DEXAMETHASONE SODIUM PHOSPHATE 10 MG/ML IJ SOLN
10.0000 mg | Freq: Once | INTRAMUSCULAR | Status: AC
  Administered 2013-05-01: 10 mg via INTRAVENOUS

## 2013-05-01 NOTE — Patient Instructions (Addendum)
[facility] Discharge Instructions for Patients Receiving Chemotherapy  Today you received the following chemotherapy agents Etoposide.  To help prevent nausea and vomiting after your treatment, we encourage you to take your nausea medication as prescribed.   If you develop nausea and vomiting that is not controlled by your nausea medication, call the clinic.   BELOW ARE SYMPTOMS THAT SHOULD BE REPORTED IMMEDIATELY:  *FEVER GREATER THAN 100.5 F  *CHILLS WITH OR WITHOUT FEVER  NAUSEA AND VOMITING THAT IS NOT CONTROLLED WITH YOUR NAUSEA MEDICATION  *UNUSUAL SHORTNESS OF BREATH  *UNUSUAL BRUISING OR BLEEDING  TENDERNESS IN MOUTH AND THROAT WITH OR WITHOUT PRESENCE OF ULCERS  *URINARY PROBLEMS  *BOWEL PROBLEMS  UNUSUAL RASH Items with * indicate a potential emergency and should be followed up as soon as possible.  Feel free to call the clinic you have any questions or concerns. The clinic phone number is [phone].   Blood Transfusion Information WHAT IS A BLOOD TRANSFUSION? A transfusion is the replacement of blood or some of its parts. Blood is made up of multiple cells which provide different functions.  Red blood cells carry oxygen and are used for blood loss replacement.  White blood cells fight against infection.  Platelets control bleeding.  Plasma helps clot blood.  Other blood products are available for specialized needs, such as hemophilia or other clotting disorders. BEFORE THE TRANSFUSION  Who gives blood for transfusions?   You may be able to donate blood to be used at a later date on yourself (autologous donation).  Relatives can be asked to donate blood. This is generally not any safer than if you have received blood from a stranger. The same precautions are taken to ensure safety when a relative's blood is donated.  Healthy volunteers who are fully evaluated to make sure their blood is safe. This is blood bank blood. Transfusion  therapy is the safest it has ever been in the practice of medicine. Before blood is taken from a donor, a complete history is taken to make sure that person has no history of diseases nor engages in risky social behavior (examples are intravenous drug use or sexual activity with multiple partners). The donor's travel history is screened to minimize risk of transmitting infections, such as malaria. The donated blood is tested for signs of infectious diseases, such as HIV and hepatitis. The blood is then tested to be sure it is compatible with you in order to minimize the chance of a transfusion reaction. If you or a relative donates blood, this is often done in anticipation of surgery and is not appropriate for emergency situations. It takes many days to process the donated blood. RISKS AND COMPLICATIONS Although transfusion therapy is very safe and saves many lives, the main dangers of transfusion include:   Getting an infectious disease.  Developing a transfusion reaction. This is an allergic reaction to something in the blood you were given. Every precaution is taken to prevent this. The decision to have a blood transfusion has been considered carefully by your caregiver before blood is given. Blood is not given unless the benefits outweigh the risks. AFTER THE TRANSFUSION  Right after receiving a blood transfusion, you will usually feel much better and more energetic. This is especially true if your red blood cells have gotten low (anemic). The transfusion raises the level of the red blood cells which carry oxygen, and this usually causes an energy increase.  The nurse administering the transfusion will monitor you carefully   for complications. HOME CARE INSTRUCTIONS  No special instructions are needed after a transfusion. You may find your energy is better. Speak with your caregiver about any limitations on activity for underlying diseases you may have. SEEK MEDICAL CARE IF:   Your condition is  not improving after your transfusion.  You develop redness or irritation at the intravenous (IV) site. SEEK IMMEDIATE MEDICAL CARE IF:  Any of the following symptoms occur over the next 12 hours:  Shaking chills.  You have a temperature by mouth above 102 F (38.9 C), not controlled by medicine.  Chest, back, or muscle pain.  People around you feel you are not acting correctly or are confused.  Shortness of breath or difficulty breathing.  Dizziness and fainting.  You get a rash or develop hives.  You have a decrease in urine output.  Your urine turns a dark color or changes to pink, red, or brown. Any of the following symptoms occur over the next 10 days:  You have a temperature by mouth above 102 F (38.9 C), not controlled by medicine.  Shortness of breath.  Weakness after normal activity.  The white part of the eye turns yellow (jaundice).  You have a decrease in the amount of urine or are urinating less often.  Your urine turns a dark color or changes to pink, red, or brown. Document Released: 11/05/2000 Document Revised: 01/31/2012 Document Reviewed: 06/24/2008 [employer] Patient Information 2014 [employer].  

## 2013-05-01 NOTE — Telephone Encounter (Signed)
Per staff phone call and POF I have schedueld appts.  JMW  

## 2013-05-02 ENCOUNTER — Encounter: Payer: Self-pay | Admitting: Radiation Oncology

## 2013-05-02 ENCOUNTER — Ambulatory Visit (HOSPITAL_BASED_OUTPATIENT_CLINIC_OR_DEPARTMENT_OTHER): Payer: Medicaid Other

## 2013-05-02 ENCOUNTER — Encounter: Payer: Self-pay | Admitting: Internal Medicine

## 2013-05-02 VITALS — BP 107/73 | HR 86 | Temp 98.8°F

## 2013-05-02 DIAGNOSIS — C3430 Malignant neoplasm of lower lobe, unspecified bronchus or lung: Secondary | ICD-10-CM

## 2013-05-02 DIAGNOSIS — C3490 Malignant neoplasm of unspecified part of unspecified bronchus or lung: Secondary | ICD-10-CM

## 2013-05-02 DIAGNOSIS — Z5111 Encounter for antineoplastic chemotherapy: Secondary | ICD-10-CM

## 2013-05-02 LAB — TYPE AND SCREEN
ABO/RH(D): A POS
Unit division: 0

## 2013-05-02 MED ORDER — ONDANSETRON 8 MG/50ML IVPB (CHCC)
8.0000 mg | Freq: Once | INTRAVENOUS | Status: AC
  Administered 2013-05-02: 8 mg via INTRAVENOUS

## 2013-05-02 MED ORDER — SODIUM CHLORIDE 0.9 % IV SOLN
120.0000 mg/m2 | Freq: Once | INTRAVENOUS | Status: AC
  Administered 2013-05-02: 230 mg via INTRAVENOUS
  Filled 2013-05-02: qty 11.5

## 2013-05-02 MED ORDER — DEXAMETHASONE SODIUM PHOSPHATE 10 MG/ML IJ SOLN
10.0000 mg | Freq: Once | INTRAMUSCULAR | Status: AC
  Administered 2013-05-02: 10 mg via INTRAVENOUS

## 2013-05-02 MED ORDER — SODIUM CHLORIDE 0.9 % IV SOLN
Freq: Once | INTRAVENOUS | Status: AC
  Administered 2013-05-02: 12:00:00 via INTRAVENOUS

## 2013-05-02 NOTE — Progress Notes (Signed)
Patient notices some swelling in both legs, no pain or redness. It is better in the am and worse in the evening.  She denies any SOB.  Advised patient to sit with legs higher than her heart, don't  stand for long periods of time and sleep with legs elevated.  She will try this today and tonight and see if they are better when she comes for her injection tomorrow.

## 2013-05-02 NOTE — Patient Instructions (Addendum)
[facility] Discharge Instructions for Patients Receiving Chemotherapy  Today you received the following chemotherapy agents etoposide.  To help prevent nausea and vomiting after your treatment, we encourage you to take your nausea medication compazine.   If you develop nausea and vomiting that is not controlled by your nausea medication, call the clinic.   BELOW ARE SYMPTOMS THAT SHOULD BE REPORTED IMMEDIATELY:  *FEVER GREATER THAN 100.5 F  *CHILLS WITH OR WITHOUT FEVER  NAUSEA AND VOMITING THAT IS NOT CONTROLLED WITH YOUR NAUSEA MEDICATION  *UNUSUAL SHORTNESS OF BREATH  *UNUSUAL BRUISING OR BLEEDING  TENDERNESS IN MOUTH AND THROAT WITH OR WITHOUT PRESENCE OF ULCERS  *URINARY PROBLEMS  *BOWEL PROBLEMS  UNUSUAL RASH Items with * indicate a potential emergency and should be followed up as soon as possible.  Feel free to call the clinic you have any questions or concerns. The clinic phone number is [phone].    

## 2013-05-03 ENCOUNTER — Ambulatory Visit: Payer: Self-pay

## 2013-05-03 ENCOUNTER — Encounter: Payer: Self-pay | Admitting: Internal Medicine

## 2013-05-03 ENCOUNTER — Telehealth: Payer: Self-pay | Admitting: *Deleted

## 2013-05-03 ENCOUNTER — Ambulatory Visit (HOSPITAL_BASED_OUTPATIENT_CLINIC_OR_DEPARTMENT_OTHER): Payer: Medicaid Other

## 2013-05-03 VITALS — BP 108/65 | HR 95 | Temp 98.5°F

## 2013-05-03 DIAGNOSIS — Z5189 Encounter for other specified aftercare: Secondary | ICD-10-CM

## 2013-05-03 DIAGNOSIS — C3490 Malignant neoplasm of unspecified part of unspecified bronchus or lung: Secondary | ICD-10-CM

## 2013-05-03 DIAGNOSIS — C3430 Malignant neoplasm of lower lobe, unspecified bronchus or lung: Secondary | ICD-10-CM

## 2013-05-03 MED ORDER — PEGFILGRASTIM INJECTION 6 MG/0.6ML
6.0000 mg | Freq: Once | SUBCUTANEOUS | Status: AC
  Administered 2013-05-03: 6 mg via SUBCUTANEOUS
  Filled 2013-05-03: qty 0.6

## 2013-05-03 NOTE — Telephone Encounter (Signed)
Pt came by the office and is asking for a trial of Ditropan for her urinary problems.   She was on Vesicare but it is too expensive. She is working on diet and taking meds as ordered for diabetes.  Her CBG's are doing better, running in 140 range. Pt will make appointment to f/u on DM as instructed.

## 2013-05-03 NOTE — Progress Notes (Signed)
Left another msg for Anita Gardner's supervisor Greta Doom at [phone] to check status of her Medicaid.

## 2013-05-03 NOTE — Telephone Encounter (Signed)
Please schedule the patient for an appointment

## 2013-05-03 NOTE — Telephone Encounter (Signed)
Pt called back and is stressed about getting this med.  She will be out of Vesicare in 2 days. Unable to reach Dr Collier Bullock and PCP is Dr Saralyn Pilar.

## 2013-05-03 NOTE — Telephone Encounter (Signed)
Hi Gayle, I will ask Dr. Dalphine Handing to address this since she heard about the patient from [place].

## 2013-05-04 ENCOUNTER — Ambulatory Visit: Payer: Self-pay

## 2013-05-07 ENCOUNTER — Other Ambulatory Visit (HOSPITAL_BASED_OUTPATIENT_CLINIC_OR_DEPARTMENT_OTHER): Payer: No Typology Code available for payment source

## 2013-05-07 DIAGNOSIS — C3492 Malignant neoplasm of unspecified part of left bronchus or lung: Secondary | ICD-10-CM

## 2013-05-07 DIAGNOSIS — C3490 Malignant neoplasm of unspecified part of unspecified bronchus or lung: Secondary | ICD-10-CM

## 2013-05-07 LAB — CBC WITH DIFFERENTIAL/PLATELET
BASO%: 0.6 % (ref 0.0–2.0)
Basophils Absolute: 0 10e3/uL (ref 0.0–0.1)
EOS%: 0.1 % (ref 0.0–7.0)
Eosinophils Absolute: 0 10e3/uL (ref 0.0–0.5)
HCT: 27.2 % — ABNORMAL LOW (ref 34.8–46.6)
HGB: 9.6 g/dL — ABNORMAL LOW (ref 11.6–15.9)
LYMPH%: 4.4 % — ABNORMAL LOW (ref 14.0–49.7)
MCH: 34.4 pg — ABNORMAL HIGH (ref 25.1–34.0)
MCHC: 35.3 g/dL (ref 31.5–36.0)
MCV: 97.5 fL (ref 79.5–101.0)
MONO#: 0.2 10e3/uL (ref 0.1–0.9)
MONO%: 3.6 % (ref 0.0–14.0)
NEUT#: 3.9 10e3/uL (ref 1.5–6.5)
NEUT%: 91.3 % — ABNORMAL HIGH (ref 38.4–76.8)
Platelets: 119 10e3/uL — ABNORMAL LOW (ref 145–400)
RBC: 2.79 10e6/uL — ABNORMAL LOW (ref 3.70–5.45)
RDW: 18.5 % — ABNORMAL HIGH (ref 11.2–14.5)
WBC: 4.3 10e3/uL (ref 3.9–10.3)
lymph#: 0.2 10e3/uL — ABNORMAL LOW (ref 0.9–3.3)

## 2013-05-07 LAB — COMPREHENSIVE METABOLIC PANEL (CC13)
ALT: 21 U/L (ref 0–55)
AST: 12 U/L (ref 5–34)
Alkaline Phosphatase: 96 U/L (ref 40–150)
Potassium: 4 mEq/L (ref 3.5–5.1)
Sodium: 135 mEq/L — ABNORMAL LOW (ref 136–145)
Total Bilirubin: 1.01 mg/dL (ref 0.20–1.20)
Total Protein: 6.9 g/dL (ref 6.4–8.3)

## 2013-05-07 NOTE — Telephone Encounter (Signed)
Pt called and message left to call clinic for appointment this week.   Will also have [employer] call pt for appointment.

## 2013-05-14 ENCOUNTER — Other Ambulatory Visit (HOSPITAL_BASED_OUTPATIENT_CLINIC_OR_DEPARTMENT_OTHER): Payer: No Typology Code available for payment source

## 2013-05-14 DIAGNOSIS — C3430 Malignant neoplasm of lower lobe, unspecified bronchus or lung: Secondary | ICD-10-CM

## 2013-05-14 DIAGNOSIS — C3492 Malignant neoplasm of unspecified part of left bronchus or lung: Secondary | ICD-10-CM

## 2013-05-14 LAB — COMPREHENSIVE METABOLIC PANEL (CC13)
Alkaline Phosphatase: 88 U/L (ref 40–150)
Glucose: 173 mg/dl — ABNORMAL HIGH (ref 70–99)
Sodium: 137 mEq/L (ref 136–145)
Total Bilirubin: 0.59 mg/dL (ref 0.20–1.20)
Total Protein: 6.5 g/dL (ref 6.4–8.3)

## 2013-05-14 LAB — CBC WITH DIFFERENTIAL/PLATELET
BASO%: 0.7 % (ref 0.0–2.0)
EOS%: 0.1 % (ref 0.0–7.0)
Eosinophils Absolute: 0 10*3/uL (ref 0.0–0.5)
LYMPH%: 7.7 % — ABNORMAL LOW (ref 14.0–49.7)
MCH: 34.6 pg — ABNORMAL HIGH (ref 25.1–34.0)
MCHC: 36.2 g/dL — ABNORMAL HIGH (ref 31.5–36.0)
MCV: 95.5 fL (ref 79.5–101.0)
MONO%: 5.7 % (ref 0.0–14.0)
Platelets: 60 10*3/uL — ABNORMAL LOW (ref 145–400)
RBC: 2.35 10*6/uL — ABNORMAL LOW (ref 3.70–5.45)

## 2013-05-17 ENCOUNTER — Ambulatory Visit (INDEPENDENT_AMBULATORY_CARE_PROVIDER_SITE_OTHER): Payer: No Typology Code available for payment source | Admitting: Internal Medicine

## 2013-05-17 ENCOUNTER — Encounter: Payer: Self-pay | Admitting: Internal Medicine

## 2013-05-17 VITALS — BP 124/74 | HR 103 | Temp 98.4°F | Ht 65.0 in | Wt 185.2 lb

## 2013-05-17 DIAGNOSIS — N3941 Urge incontinence: Secondary | ICD-10-CM

## 2013-05-17 DIAGNOSIS — C3430 Malignant neoplasm of lower lobe, unspecified bronchus or lung: Secondary | ICD-10-CM

## 2013-05-17 MED ORDER — RANITIDINE HCL 150 MG PO TABS: 150.0000 mg | ORAL_TABLET | Freq: Two times a day (BID) | ORAL | Status: DC

## 2013-05-17 MED ORDER — METFORMIN HCL 1000 MG PO TABS: 1000.0000 mg | ORAL_TABLET | Freq: Two times a day (BID) | ORAL | Status: DC

## 2013-05-17 MED ORDER — GLYBURIDE 5 MG PO TABS: 5.0000 mg | ORAL_TABLET | Freq: Two times a day (BID) | ORAL | Status: DC

## 2013-05-17 MED ORDER — OXYBUTYNIN CHLORIDE 5 MG PO TABS: 5.0000 mg | ORAL_TABLET | Freq: Three times a day (TID) | ORAL | Status: DC

## 2013-05-17 NOTE — Assessment & Plan Note (Addendum)
The patient notes symptoms consistent with urge incontinence.  She experienced relief with vesicare, but can no longer afford this medication.  Oxybutynin is a good alternative. -start oxybutynin 5 mg TID -discussed behavioral strategies, such as urinating on a set schedule, and finding the nearest restroom when she arrives at public places -checking UA for UTI

## 2013-05-17 NOTE — Progress Notes (Signed)
HPI The patient is a 57 y.o. female with a history of DM, SCLC currently undergoing chemo, HTN, presenting for a follow-up visit.  The patient has a history of urinary incontinence.  She notes having to drink extra water as instructed prior to chemo, which exacerbates her incontinence.  The patient notes that when she feels the urge to urinate, she has to urinate right away.  She notes no incontinence with laughing or coughing.  She notes no difficulty starting urination.  She can no longer afford Vesicare, and is interested in starting ditropan.  She notes no dysuria  The patient has a history of DM.  She did not bring her glucometer today.  She notes that her blood sugars have improved from the 300's in the past, to the 160's now.  The patient notes a range from 150-200's.  She notes 1 episode of hypoglycemia since her last visit, which occurred in the morning.  She has been trying diet and exercise changes.  The patient notes quitting smoking 5/5!  She quit "cold [place]", without the aid of medications.  ROS: General: no fevers, chills, changes in weight, changes in appetite Skin: no rash HEENT: no blurry vision, hearing changes, sore throat Pulm: no dyspnea, coughing, wheezing CV: no chest pain, palpitations, shortness of breath Abd: no abdominal pain, nausea/vomiting, diarrhea/constipation GU: no dysuria, hematuria, polyuria Ext: no arthralgias, myalgias Neuro: no weakness, numbness, or tingling  Filed Vitals:   05/17/13 1431  BP: 124/74  Pulse: 103  Temp: 98.4 F (36.9 C)    PEX General: alert, cooperative, and in no apparent distress HEENT: pupils equal round and reactive to light, vision grossly intact, oropharynx clear and non-erythematous  Neck: supple, no lymphadenopathy Lungs: clear to ascultation bilaterally, normal work of respiration, no wheezes, rales, ronchi Heart: regular rate and rhythm, no murmurs, gallops, or rubs Abdomen: soft, non-tender, non-distended, normal  bowel sounds Extremities: 2+ DP/PT pulses bilaterally, no cyanosis, clubbing, or edema Neurologic: alert & oriented X3, cranial nerves II-XII intact, strength grossly intact, sensation intact to light touch  Current Outpatient Prescriptions on File Prior to Visit  Medication Sig Dispense Refill  . Cholecalciferol (VITAMIN D-3) 5000 UNITS TABS Take 5,000 Units by mouth daily.      Marland Kitchen glyBURIDE (DIABETA) 5 MG tablet Take 1 tablet (5 mg total) by mouth 2 (two) times daily with a meal.  180 tablet  1  . metFORMIN (GLUCOPHAGE) 1000 MG tablet Take 1 tablet (1,000 mg total) by mouth 2 (two) times daily with a meal.  180 tablet  1  . Multiple Vitamin (MULTIVITAMIN WITH MINERALS) TABS Take 1 tablet by mouth daily.      . Omega-3 Fatty Acids (FISH OIL) 1200 MG CAPS Take 1,200 mg by mouth daily.      . prochlorperazine (COMPAZINE) 10 MG tablet Take 1 tablet (10 mg total) by mouth every 6 (six) hours as needed.  60 tablet  0  . ranitidine (ZANTAC) 150 MG tablet Take 1 tablet (150 mg total) by mouth 2 (two) times daily.  60 tablet  5  . solifenacin (VESICARE) 10 MG tablet Take 1 tablet (10 mg total) by mouth daily.  30 tablet  4  . sucralfate (CARAFATE) 1 GM/10ML suspension Take 10 mLs (1 g total) by mouth 4 (four) times daily.  420 mL  1   No current facility-administered medications on file prior to visit.    Assessment/Plan

## 2013-05-17 NOTE — Assessment & Plan Note (Signed)
Lab Results  Component Value Date   HGBA1C 9.9 04/05/2013   HGBA1C 10.2* 11/10/2012     Assessment: Diabetes control: poor control (HgbA1C >9%) Progress toward A1C goal:  improved Comments: The patient's last A1C was 9.9.  She is on glyburide and metformin.  She does note 1 episode of hypoglycemia since her last visit, though it may have been diet-related.  Since she did not bring her glucometer, and notes an episode of hypoglycemia, we will not increase her hypoglycemic medications at this time, but will follow-up at her next visit  Plan: Medications:  continue current medications Home glucose monitoring: Frequency: 2 times a day Timing: before meals Instruction/counseling given: reminded to bring blood glucose meter & log to each visit Educational resources provided:   Self management tools provided:   Other plans: Patient underwent an eye exam today with our retinal camera.  Foot exam performed today.

## 2013-05-17 NOTE — Patient Instructions (Signed)
General Instructions: For your urinary incontinence, we are starting Ditropan.  Take 1 tablet, three times per day.  Other helpful tips include: -urinating on a schedule (ie every 2 hours) -locating a bathroom when you first arrive at public places  For your diabetes, continue your current medications.  You are due for an eye exam, and we can perform this today.  Please return for a follow-up visit in 2-3 months   Treatment Goals:  Goals (1 Years of Data) as of 05/17/13         As of Today 05/03/13 05/02/13 05/01/13 05/01/13     Blood Pressure    . Blood Pressure < 140/90  124/74 108/65 107/73 123/79 139/84     Result Component    . HEMOGLOBIN A1C < 7.0          . LDL CALC < 100            Progress Toward Treatment Goals:  Treatment Goal 05/17/2013  Hemoglobin A1C improved  Blood pressure at goal    Self Care Goals & Plans:  Self Care Goal 11/29/2012  Manage my medications take my medicines as prescribed  Monitor my health bring my glucose meter and log to each visit  Eat healthy foods drink diet soda or water instead of juice or soda; eat foods that are low in salt; eat baked foods instead of fried foods  Be physically active take a walk every day  Stop smoking call [facility] (1-800-QUIT-NOW)    Home Blood Glucose Monitoring 05/17/2013  Check my blood sugar 2 times a day  When to check my blood sugar before meals     Care Management & Community Referrals:  Referral 05/17/2013  Referrals made for care management support none needed

## 2013-05-17 NOTE — Progress Notes (Signed)
Case discussed with Dr. Brown (at time of visit, soon after the resident saw the patient).  We reviewed the resident's history and exam and pertinent patient test results.  I agree with the assessment, diagnosis, and plan of care documented in the resident's note. 

## 2013-05-18 LAB — URINALYSIS, ROUTINE W REFLEX MICROSCOPIC
Bilirubin Urine: NEGATIVE
Glucose, UA: NEGATIVE mg/dL
Protein, ur: NEGATIVE mg/dL
Specific Gravity, Urine: 1.021 (ref 1.005–1.030)

## 2013-05-21 ENCOUNTER — Other Ambulatory Visit: Payer: Self-pay | Admitting: *Deleted

## 2013-05-21 ENCOUNTER — Other Ambulatory Visit (HOSPITAL_BASED_OUTPATIENT_CLINIC_OR_DEPARTMENT_OTHER): Payer: No Typology Code available for payment source

## 2013-05-21 ENCOUNTER — Telehealth: Payer: Self-pay | Admitting: Internal Medicine

## 2013-05-21 ENCOUNTER — Other Ambulatory Visit: Payer: Self-pay

## 2013-05-21 ENCOUNTER — Encounter: Payer: Self-pay | Admitting: Internal Medicine

## 2013-05-21 ENCOUNTER — Telehealth: Payer: Self-pay | Admitting: *Deleted

## 2013-05-21 ENCOUNTER — Ambulatory Visit (HOSPITAL_BASED_OUTPATIENT_CLINIC_OR_DEPARTMENT_OTHER): Payer: No Typology Code available for payment source | Admitting: Internal Medicine

## 2013-05-21 ENCOUNTER — Ambulatory Visit: Payer: No Typology Code available for payment source

## 2013-05-21 ENCOUNTER — Encounter (HOSPITAL_COMMUNITY)
Admission: RE | Admit: 2013-05-21 | Discharge: 2013-05-21 | Disposition: A | Discharge status: Home / Self Care | Payer: No Typology Code available for payment source | Source: Ambulatory Visit | Attending: Internal Medicine | Admitting: Internal Medicine

## 2013-05-21 ENCOUNTER — Ambulatory Visit (HOSPITAL_BASED_OUTPATIENT_CLINIC_OR_DEPARTMENT_OTHER): Payer: No Typology Code available for payment source

## 2013-05-21 VITALS — BP 97/63 | HR 84 | Temp 97.2°F | Resp 18 | Ht 65.0 in | Wt 186.4 lb

## 2013-05-21 VITALS — BP 122/80 | HR 80 | Temp 97.2°F | Resp 18

## 2013-05-21 DIAGNOSIS — T451X5A Adverse effect of antineoplastic and immunosuppressive drugs, initial encounter: Secondary | ICD-10-CM

## 2013-05-21 DIAGNOSIS — D6481 Anemia due to antineoplastic chemotherapy: Secondary | ICD-10-CM

## 2013-05-21 DIAGNOSIS — R5383 Other fatigue: Secondary | ICD-10-CM

## 2013-05-21 DIAGNOSIS — C3492 Malignant neoplasm of unspecified part of left bronchus or lung: Secondary | ICD-10-CM

## 2013-05-21 DIAGNOSIS — C3430 Malignant neoplasm of lower lobe, unspecified bronchus or lung: Secondary | ICD-10-CM

## 2013-05-21 DIAGNOSIS — Z5111 Encounter for antineoplastic chemotherapy: Secondary | ICD-10-CM

## 2013-05-21 LAB — COMPREHENSIVE METABOLIC PANEL (CC13)
BUN: 6.4 mg/dL — ABNORMAL LOW (ref 7.0–26.0)
CO2: 27 mEq/L (ref 22–29)
Calcium: 9.3 mg/dL (ref 8.4–10.4)
Chloride: 102 mEq/L (ref 98–109)
Creatinine: 0.7 mg/dL (ref 0.6–1.1)

## 2013-05-21 LAB — CBC WITH DIFFERENTIAL/PLATELET
Eosinophils Absolute: 0 10*3/uL (ref 0.0–0.5)
HCT: 22.6 % — ABNORMAL LOW (ref 34.8–46.6)
LYMPH%: 13.6 % — ABNORMAL LOW (ref 14.0–49.7)
MONO#: 0.7 10*3/uL (ref 0.1–0.9)
NEUT#: 4.7 10*3/uL (ref 1.5–6.5)
NEUT%: 74.3 % (ref 38.4–76.8)
Platelets: 179 10*3/uL (ref 145–400)
WBC: 6.3 10*3/uL (ref 3.9–10.3)

## 2013-05-21 LAB — PREPARE RBC (CROSSMATCH)

## 2013-05-21 MED ORDER — DIPHENHYDRAMINE HCL 25 MG PO CAPS
25.0000 mg | ORAL_CAPSULE | Freq: Once | ORAL | Status: AC
  Administered 2013-05-21: 25 mg via ORAL

## 2013-05-21 MED ORDER — SODIUM CHLORIDE 0.9 % IV SOLN
100.0000 mg/m2 | Freq: Once | INTRAVENOUS | Status: AC
  Administered 2013-05-21: 200 mg via INTRAVENOUS
  Filled 2013-05-21: qty 10

## 2013-05-21 MED ORDER — ACETAMINOPHEN 325 MG PO TABS
650.0000 mg | ORAL_TABLET | Freq: Once | ORAL | Status: AC
  Administered 2013-05-21: 650 mg via ORAL

## 2013-05-21 MED ORDER — DEXAMETHASONE SODIUM PHOSPHATE 20 MG/5ML IJ SOLN
20.0000 mg | Freq: Once | INTRAMUSCULAR | Status: AC
  Administered 2013-05-21: 20 mg via INTRAVENOUS

## 2013-05-21 MED ORDER — SODIUM CHLORIDE 0.9 % IV SOLN
250.0000 mL | Freq: Once | INTRAVENOUS | Status: AC
  Administered 2013-05-21: 250 mL via INTRAVENOUS

## 2013-05-21 MED ORDER — ONDANSETRON 16 MG/50ML IVPB (CHCC)
16.0000 mg | Freq: Once | INTRAVENOUS | Status: AC
Start: 2013-05-21 — End: 2013-05-21
  Administered 2013-05-21: 16 mg via INTRAVENOUS

## 2013-05-21 MED ORDER — SODIUM CHLORIDE 0.9 % IV SOLN
Freq: Once | INTRAVENOUS | Status: AC
  Administered 2013-05-21: 14:00:00 via INTRAVENOUS

## 2013-05-21 MED ORDER — SODIUM CHLORIDE 0.9 % IV SOLN
600.0000 mg | Freq: Once | INTRAVENOUS | Status: AC
  Administered 2013-05-21: 600 mg via INTRAVENOUS
  Filled 2013-05-21: qty 60

## 2013-05-21 NOTE — Telephone Encounter (Signed)
gave pt appt for lab, MD ansd chemo for July 2014, pt sent to labs today

## 2013-05-21 NOTE — Patient Instructions (Signed)
Continue chemotherapy today as scheduled. I will arrange for you to receive 2 units of PRBCs transfusion. Followup visit in one month with repeat CT scan of the chest.

## 2013-05-21 NOTE — Telephone Encounter (Signed)
Per staff phone call and POF I have schedueld appts.  JMW  

## 2013-05-21 NOTE — Patient Instructions (Addendum)
[facility] Discharge Instructions for Patients Receiving Chemotherapy  Today you received the following chemotherapy agents Etoposide/Carboplatin.  To help prevent nausea and vomiting after your treatment, we encourage you to take your nausea medication as prescribed.   If you develop nausea and vomiting that is not controlled by your nausea medication, call the clinic.   BELOW ARE SYMPTOMS THAT SHOULD BE REPORTED IMMEDIATELY:  *FEVER GREATER THAN 100.5 F  *CHILLS WITH OR WITHOUT FEVER  NAUSEA AND VOMITING THAT IS NOT CONTROLLED WITH YOUR NAUSEA MEDICATION  *UNUSUAL SHORTNESS OF BREATH  *UNUSUAL BRUISING OR BLEEDING  TENDERNESS IN MOUTH AND THROAT WITH OR WITHOUT PRESENCE OF ULCERS  *URINARY PROBLEMS  *BOWEL PROBLEMS  UNUSUAL RASH Items with * indicate a potential emergency and should be followed up as soon as possible.  Feel free to call the clinic you have any questions or concerns. The clinic phone number is [phone].   Blood Transfusion Information WHAT IS A BLOOD TRANSFUSION? A transfusion is the replacement of blood or some of its parts. Blood is made up of multiple cells which provide different functions.  Red blood cells carry oxygen and are used for blood loss replacement.  White blood cells fight against infection.  Platelets control bleeding.  Plasma helps clot blood.  Other blood products are available for specialized needs, such as hemophilia or other clotting disorders. BEFORE THE TRANSFUSION  Who gives blood for transfusions?   You may be able to donate blood to be used at a later date on yourself (autologous donation).  Relatives can be asked to donate blood. This is generally not any safer than if you have received blood from a stranger. The same precautions are taken to ensure safety when a relative's blood is donated.  Healthy volunteers who are fully evaluated to make sure their blood is safe. This is blood bank  blood. Transfusion therapy is the safest it has ever been in the practice of medicine. Before blood is taken from a donor, a complete history is taken to make sure that person has no history of diseases nor engages in risky social behavior (examples are intravenous drug use or sexual activity with multiple partners). The donor's travel history is screened to minimize risk of transmitting infections, such as malaria. The donated blood is tested for signs of infectious diseases, such as HIV and hepatitis. The blood is then tested to be sure it is compatible with you in order to minimize the chance of a transfusion reaction. If you or a relative donates blood, this is often done in anticipation of surgery and is not appropriate for emergency situations. It takes many days to process the donated blood. RISKS AND COMPLICATIONS Although transfusion therapy is very safe and saves many lives, the main dangers of transfusion include:   Getting an infectious disease.  Developing a transfusion reaction. This is an allergic reaction to something in the blood you were given. Every precaution is taken to prevent this. The decision to have a blood transfusion has been considered carefully by your caregiver before blood is given. Blood is not given unless the benefits outweigh the risks. AFTER THE TRANSFUSION  Right after receiving a blood transfusion, you will usually feel much better and more energetic. This is especially true if your red blood cells have gotten low (anemic). The transfusion raises the level of the red blood cells which carry oxygen, and this usually causes an energy increase.  The nurse administering the transfusion will monitor you carefully  for complications. HOME CARE INSTRUCTIONS  No special instructions are needed after a transfusion. You may find your energy is better. Speak with your caregiver about any limitations on activity for underlying diseases you may have. SEEK MEDICAL CARE IF:    Your condition is not improving after your transfusion.  You develop redness or irritation at the intravenous (IV) site. SEEK IMMEDIATE MEDICAL CARE IF:  Any of the following symptoms occur over the next 12 hours:  Shaking chills.  You have a temperature by mouth above 102 F (38.9 C), not controlled by medicine.  Chest, back, or muscle pain.  People around you feel you are not acting correctly or are confused.  Shortness of breath or difficulty breathing.  Dizziness and fainting.  You get a rash or develop hives.  You have a decrease in urine output.  Your urine turns a dark color or changes to pink, red, or brown. Any of the following symptoms occur over the next 10 days:  You have a temperature by mouth above 102 F (38.9 C), not controlled by medicine.  Shortness of breath.  Weakness after normal activity.  The white part of the eye turns yellow (jaundice).  You have a decrease in the amount of urine or are urinating less often.  Your urine turns a dark color or changes to pink, red, or brown. Document Released: 11/05/2000 Document Revised: 01/31/2012 Document Reviewed: 06/24/2008 [facility] Patient Information 2014 [place], [place].

## 2013-05-21 NOTE — Progress Notes (Signed)
[facility] Telephone:([phone]) [phone]   Fax:(336) [phone]  OFFICE PROGRESS NOTE  Anita Abraham, DO [address] [place] [address]  DIAGNOSIS: Limited stage small cell lung cancer diagnosed in March of 2014.   PRIOR THERAPY: None   CURRENT THERAPY: Systemic chemotherapy with carboplatin for AUC of 5 on day 1 and etoposide 120 mg/M2 days 1, 2 and 3 with Neulasta support on day 4. The patient is status post 4 cycles. This is concurrent with radiation.  INTERVAL HISTORY: Anita Gardner 57 y.o. female returns to the clinic today for followup visit. The patient is feeling fine today except for fatigue. She denied having any significant nausea or vomiting. She has no fever or chills. The patient denied having any significant chest pain, shortness breath, cough or hemoptysis. She is tolerating her systemic chemotherapy fairly well except for the fatigue. She is today to start cycle #5 of her chemotherapy.  MEDICAL HISTORY: Past Medical History  Diagnosis Date  . Chronic pain of left knee     secondary to arthritis  . Hypertension   . Diabetic peripheral neuropathy   . GERD (gastroesophageal reflux disease)   . Diabetes mellitus type 2, uncontrolled, without complications DX: 2000    on oral medications only  . Constipation   . Anxiety   . Depression   . Panic attacks   . Bipolar 1 disorder   . COPD (chronic obstructive pulmonary disease)   . 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 Tyrone Sage (CVTS), Dr. Gwenyth Bouillon (Onc) // LLL mass noted 11/2012// PET scan 12/2012 - mediastinal + left infrahilar nodal metastasis AND low left mediastinum or posterior left pericardium is hypermetabolic likely nodal metastasis // Stage IIIA, T1bN2MX at diagnosis // Bronchoscopy 01/2013 confirmed small cell ca. // Rad tx 04-06/14 // chemo started 04/14    ALLERGIES:  is allergic to ampicillin and codeine.  MEDICATIONS:  Current Outpatient  Prescriptions  Medication Sig Dispense Refill  . Cholecalciferol (VITAMIN D-3) 5000 UNITS TABS Take 5,000 Units by mouth daily.      Marland Kitchen glyBURIDE (DIABETA) 5 MG tablet Take 1 tablet (5 mg total) by mouth 2 (two) times daily with a meal.  180 tablet  3  . metFORMIN (GLUCOPHAGE) 1000 MG tablet Take 1 tablet (1,000 mg total) by mouth 2 (two) times daily with a meal.  180 tablet  3  . Multiple Vitamin (MULTIVITAMIN WITH MINERALS) TABS Take 1 tablet by mouth daily.      . Omega-3 Fatty Acids (FISH OIL) 1200 MG CAPS Take 1,200 mg by mouth daily.      Marland Kitchen oxybutynin (DITROPAN) 5 MG tablet Take 1 tablet (5 mg total) by mouth 3 (three) times daily.  90 tablet  3  . prochlorperazine (COMPAZINE) 10 MG tablet Take 1 tablet (10 mg total) by mouth every 6 (six) hours as needed.  60 tablet  0  . ranitidine (ZANTAC) 150 MG tablet Take 1 tablet (150 mg total) by mouth 2 (two) times daily.  60 tablet  5  . sucralfate (CARAFATE) 1 GM/10ML suspension Take 10 mLs (1 g total) by mouth 4 (four) times daily.  420 mL  1   No current facility-administered medications for this visit.    SURGICAL HISTORY:  Past Surgical History  Procedure Laterality Date  . Left leg surgery      as a child  . Video bronchoscopy with endobronchial ultrasound N/A 02/14/2013    Procedure:  VIDEO BRONCHOSCOPY WITH ENDOBRONCHIAL ULTRASOUND;  Surgeon: Delight Ovens, MD;  Location: [facility] OR;  Service: Thoracic;  Laterality: N/A;    REVIEW OF SYSTEMS:  A comprehensive review of systems was negative except for: Constitutional: positive for fatigue   PHYSICAL EXAMINATION: General appearance: alert, cooperative, fatigued and no distress Head: Normocephalic, without obvious abnormality, atraumatic Neck: no adenopathy Lymph nodes: Cervical, supraclavicular, and axillary nodes normal. Resp: clear to auscultation bilaterally Back: symmetric, no curvature. ROM normal. No CVA tenderness. Cardio: regular rate and rhythm, S1, S2 normal, no murmur,  click, rub or gallop GI: soft, non-tender; bowel sounds normal; no masses,  no organomegaly Extremities: extremities normal, atraumatic, no cyanosis or edema Neurologic: Alert and oriented X 3, normal strength and tone. Normal symmetric reflexes. Normal coordination and gait  ECOG PERFORMANCE STATUS: 1 - Symptomatic but completely ambulatory  Blood pressure 97/63, pulse 84, temperature 97.2 F (36.2 C), temperature source Oral, resp. rate 18, height 5\' 5"  (1.651 m), weight 186 lb 6.4 oz (84.55 kg).  LABORATORY DATA: Lab Results  Component Value Date   WBC 6.3 05/21/2013   HGB 7.5* 05/21/2013   HCT 22.6* 05/21/2013   MCV 100.4 05/21/2013   PLT 179 05/21/2013      Chemistry      Component Value Date/Time   NA 137 05/14/2013 1143   NA 133* 02/14/2013 0643   K 4.0 05/14/2013 1143   K 4.0 02/14/2013 0643   CL 102 05/14/2013 1143   CL 96 02/14/2013 0643   CO2 23 05/14/2013 1143   CO2 25 02/14/2013 0643   BUN 11.2 05/14/2013 1143   BUN 12 02/14/2013 0643   CREATININE 0.6 05/14/2013 1143   CREATININE 0.41* 02/14/2013 0643   CREATININE 0.75 11/29/2012 1601      Component Value Date/Time   CALCIUM 9.4 05/14/2013 1143   CALCIUM 9.6 02/14/2013 0643   ALKPHOS 88 05/14/2013 1143   ALKPHOS 78 02/14/2013 0643   AST 14 05/14/2013 1143   AST 15 02/14/2013 0643   ALT 20 05/14/2013 1143   ALT 23 02/14/2013 0643   BILITOT 0.59 05/14/2013 1143   BILITOT 0.4 02/14/2013 0643       RADIOGRAPHIC STUDIES: No results found.  ASSESSMENT AND PLAN: This is a very pleasant 57 years old white female with limited stage small cell lung cancer currently undergoing systemic chemotherapy was carboplatin and etoposide status post 4 cycles. She is tolerating her treatment fairly well except for fatigue secondary to chemotherapy-induced anemia. I recommended for the patient to proceed with cycle #5 today as scheduled. For the chemotherapy-induced anemia, I will arrange for the patient to receive 2 units of PRBCs  transfusion. She would come back for followup visit in one month with repeat CT scan of the chest for restaging of her disease. She was advised to call immediately if she has any concerning symptoms in the interval. All questions were answered. The patient knows to call the clinic with any problems, questions or concerns. We can certainly see the patient much sooner if necessary.  I spent 15 minutes counseling the patient face to face. The total time spent in the appointment was 25 minutes.

## 2013-05-22 ENCOUNTER — Other Ambulatory Visit: Payer: Self-pay | Admitting: *Deleted

## 2013-05-22 ENCOUNTER — Encounter (HOSPITAL_COMMUNITY)
Admission: RE | Admit: 2013-05-22 | Discharge: 2013-05-22 | Disposition: A | Discharge status: Home / Self Care | Payer: No Typology Code available for payment source | Source: Ambulatory Visit | Attending: Internal Medicine | Admitting: Internal Medicine

## 2013-05-22 ENCOUNTER — Ambulatory Visit (HOSPITAL_BASED_OUTPATIENT_CLINIC_OR_DEPARTMENT_OTHER): Payer: No Typology Code available for payment source

## 2013-05-22 VITALS — BP 106/62 | HR 80 | Temp 98.2°F | Resp 18

## 2013-05-22 DIAGNOSIS — D649 Anemia, unspecified: Secondary | ICD-10-CM

## 2013-05-22 DIAGNOSIS — C3430 Malignant neoplasm of lower lobe, unspecified bronchus or lung: Secondary | ICD-10-CM

## 2013-05-22 DIAGNOSIS — T451X5A Adverse effect of antineoplastic and immunosuppressive drugs, initial encounter: Secondary | ICD-10-CM

## 2013-05-22 DIAGNOSIS — C3492 Malignant neoplasm of unspecified part of left bronchus or lung: Secondary | ICD-10-CM

## 2013-05-22 DIAGNOSIS — Z5111 Encounter for antineoplastic chemotherapy: Secondary | ICD-10-CM

## 2013-05-22 LAB — TYPE AND SCREEN
Antibody Screen: NEGATIVE
Unit division: 0

## 2013-05-22 MED ORDER — ACETAMINOPHEN 325 MG PO TABS
650.0000 mg | ORAL_TABLET | Freq: Once | ORAL | Status: AC
  Administered 2013-05-22: 650 mg via ORAL

## 2013-05-22 MED ORDER — DEXAMETHASONE SODIUM PHOSPHATE 10 MG/ML IJ SOLN
10.0000 mg | Freq: Once | INTRAMUSCULAR | Status: AC
  Administered 2013-05-22: 10 mg via INTRAVENOUS

## 2013-05-22 MED ORDER — ONDANSETRON 8 MG/50ML IVPB (CHCC)
8.0000 mg | Freq: Once | INTRAVENOUS | Status: AC
Start: 2013-05-22 — End: 2013-05-22
  Administered 2013-05-22: 8 mg via INTRAVENOUS

## 2013-05-22 MED ORDER — SODIUM CHLORIDE 0.9 % IV SOLN
100.0000 mg/m2 | Freq: Once | INTRAVENOUS | Status: AC
  Administered 2013-05-22: 200 mg via INTRAVENOUS
  Filled 2013-05-22: qty 10

## 2013-05-22 MED ORDER — SODIUM CHLORIDE 0.9 % IV SOLN
Freq: Once | INTRAVENOUS | Status: AC
  Administered 2013-05-22: 14:00:00 via INTRAVENOUS

## 2013-05-22 MED ORDER — DIPHENHYDRAMINE HCL 25 MG PO CAPS
25.0000 mg | ORAL_CAPSULE | Freq: Once | ORAL | Status: AC
  Administered 2013-05-22: 25 mg via ORAL

## 2013-05-22 NOTE — Patient Instructions (Addendum)
[facility] Discharge Instructions for Patients Receiving Chemotherapy  Today you received the following chemotherapy agents: Etoposide.  You also received a blood transfusion. To help prevent nausea and vomiting after your treatment, we encourage you to take your nausea medication. If you develop nausea and vomiting that is not controlled by your nausea medication, call the clinic.   BELOW ARE SYMPTOMS THAT SHOULD BE REPORTED IMMEDIATELY:  *FEVER GREATER THAN 100.5 F  *CHILLS WITH OR WITHOUT FEVER  NAUSEA AND VOMITING THAT IS NOT CONTROLLED WITH YOUR NAUSEA MEDICATION  *UNUSUAL SHORTNESS OF BREATH  *UNUSUAL BRUISING OR BLEEDING  TENDERNESS IN MOUTH AND THROAT WITH OR WITHOUT PRESENCE OF ULCERS  *URINARY PROBLEMS  *BOWEL PROBLEMS  UNUSUAL RASH Items with * indicate a potential emergency and should be followed up as soon as possible.  Feel free to call the clinic you have any questions or concerns. The clinic phone number is [phone].

## 2013-05-23 ENCOUNTER — Ambulatory Visit (HOSPITAL_BASED_OUTPATIENT_CLINIC_OR_DEPARTMENT_OTHER): Payer: No Typology Code available for payment source

## 2013-05-23 VITALS — BP 123/70 | HR 100 | Temp 98.8°F | Resp 20

## 2013-05-23 DIAGNOSIS — C3430 Malignant neoplasm of lower lobe, unspecified bronchus or lung: Secondary | ICD-10-CM

## 2013-05-23 DIAGNOSIS — Z5111 Encounter for antineoplastic chemotherapy: Secondary | ICD-10-CM

## 2013-05-23 DIAGNOSIS — C3492 Malignant neoplasm of unspecified part of left bronchus or lung: Secondary | ICD-10-CM

## 2013-05-23 LAB — TYPE AND SCREEN
ABO/RH(D): A POS
Antibody Screen: NEGATIVE

## 2013-05-23 MED ORDER — DEXAMETHASONE SODIUM PHOSPHATE 10 MG/ML IJ SOLN
10.0000 mg | Freq: Once | INTRAMUSCULAR | Status: AC
  Administered 2013-05-23: 10 mg via INTRAVENOUS

## 2013-05-23 MED ORDER — SODIUM CHLORIDE 0.9 % IV SOLN
100.0000 mg/m2 | Freq: Once | INTRAVENOUS | Status: AC
  Administered 2013-05-23: 200 mg via INTRAVENOUS
  Filled 2013-05-23: qty 10

## 2013-05-23 MED ORDER — ONDANSETRON 8 MG/50ML IVPB (CHCC)
8.0000 mg | Freq: Once | INTRAVENOUS | Status: AC
  Administered 2013-05-23: 8 mg via INTRAVENOUS

## 2013-05-23 MED ORDER — SODIUM CHLORIDE 0.9 % IV SOLN
Freq: Once | INTRAVENOUS | Status: AC
  Administered 2013-05-23: 14:00:00 via INTRAVENOUS

## 2013-05-24 ENCOUNTER — Ambulatory Visit (HOSPITAL_BASED_OUTPATIENT_CLINIC_OR_DEPARTMENT_OTHER): Payer: No Typology Code available for payment source

## 2013-05-24 VITALS — BP 121/76 | HR 92 | Temp 99.0°F

## 2013-05-24 DIAGNOSIS — Z5189 Encounter for other specified aftercare: Secondary | ICD-10-CM

## 2013-05-24 DIAGNOSIS — C3430 Malignant neoplasm of lower lobe, unspecified bronchus or lung: Secondary | ICD-10-CM

## 2013-05-24 DIAGNOSIS — C3492 Malignant neoplasm of unspecified part of left bronchus or lung: Secondary | ICD-10-CM

## 2013-05-24 MED ORDER — PEGFILGRASTIM INJECTION 6 MG/0.6ML
6.0000 mg | Freq: Once | SUBCUTANEOUS | Status: AC
  Administered 2013-05-24: 6 mg via SUBCUTANEOUS
  Filled 2013-05-24: qty 0.6

## 2013-05-28 ENCOUNTER — Other Ambulatory Visit (HOSPITAL_BASED_OUTPATIENT_CLINIC_OR_DEPARTMENT_OTHER): Payer: No Typology Code available for payment source

## 2013-05-28 DIAGNOSIS — C3430 Malignant neoplasm of lower lobe, unspecified bronchus or lung: Secondary | ICD-10-CM

## 2013-05-28 DIAGNOSIS — C3492 Malignant neoplasm of unspecified part of left bronchus or lung: Secondary | ICD-10-CM

## 2013-05-28 LAB — COMPREHENSIVE METABOLIC PANEL (CC13)
Albumin: 3.7 g/dL (ref 3.5–5.0)
Alkaline Phosphatase: 113 U/L (ref 40–150)
BUN: 6 mg/dL — ABNORMAL LOW (ref 7.0–26.0)
CO2: 25 mEq/L (ref 22–29)
Calcium: 9.1 mg/dL (ref 8.4–10.4)
Chloride: 102 mEq/L (ref 98–109)
Glucose: 157 mg/dl — ABNORMAL HIGH (ref 70–140)
Potassium: 3.6 mEq/L (ref 3.5–5.1)
Sodium: 136 mEq/L (ref 136–145)
Total Protein: 6.7 g/dL (ref 6.4–8.3)

## 2013-05-28 LAB — CBC WITH DIFFERENTIAL/PLATELET
Basophils Absolute: 0 10*3/uL (ref 0.0–0.1)
Eosinophils Absolute: 0 10*3/uL (ref 0.0–0.5)
HGB: 9 g/dL — ABNORMAL LOW (ref 11.6–15.9)
MCV: 99.1 fL (ref 79.5–101.0)
MONO#: 0.3 10*3/uL (ref 0.1–0.9)
NEUT#: 9.6 10*3/uL — ABNORMAL HIGH (ref 1.5–6.5)
RBC: 2.61 10*6/uL — ABNORMAL LOW (ref 3.70–5.45)
RDW: 21.7 % — ABNORMAL HIGH (ref 11.2–14.5)
WBC: 10.5 10*3/uL — ABNORMAL HIGH (ref 3.9–10.3)
lymph#: 0.5 10*3/uL — ABNORMAL LOW (ref 0.9–3.3)

## 2013-05-30 ENCOUNTER — Encounter: Payer: Self-pay | Admitting: Radiation Oncology

## 2013-06-01 ENCOUNTER — Ambulatory Visit: Admission: RE | Admit: 2013-06-01 | Payer: Self-pay | Source: Ambulatory Visit | Admitting: Radiation Oncology

## 2013-06-01 HISTORY — DX: Personal history of irradiation: Z92.3

## 2013-06-01 HISTORY — DX: Personal history of antineoplastic chemotherapy: Z92.21

## 2013-06-01 NOTE — Addendum Note (Signed)
Addended by: Remus Blake on: 06/01/2013 09:30 AM   Modules accepted: Orders

## 2013-06-04 ENCOUNTER — Other Ambulatory Visit (HOSPITAL_BASED_OUTPATIENT_CLINIC_OR_DEPARTMENT_OTHER): Payer: No Typology Code available for payment source | Admitting: Lab

## 2013-06-04 DIAGNOSIS — C3492 Malignant neoplasm of unspecified part of left bronchus or lung: Secondary | ICD-10-CM

## 2013-06-04 DIAGNOSIS — C3490 Malignant neoplasm of unspecified part of unspecified bronchus or lung: Secondary | ICD-10-CM

## 2013-06-04 LAB — COMPREHENSIVE METABOLIC PANEL (CC13)
ALT: 40 U/L (ref 0–55)
AST: 20 U/L (ref 5–34)
Albumin: 3.6 g/dL (ref 3.5–5.0)
Calcium: 9.3 mg/dL (ref 8.4–10.4)
Chloride: 103 mEq/L (ref 98–109)
Creatinine: 0.6 mg/dL (ref 0.6–1.1)
Potassium: 3.8 mEq/L (ref 3.5–5.1)

## 2013-06-04 LAB — CBC WITH DIFFERENTIAL/PLATELET
BASO%: 0.2 % (ref 0.0–2.0)
EOS%: 0.1 % (ref 0.0–7.0)
MCH: 35.5 pg — ABNORMAL HIGH (ref 25.1–34.0)
MCHC: 34.6 g/dL (ref 31.5–36.0)
RDW: 24.4 % — ABNORMAL HIGH (ref 11.2–14.5)
lymph#: 0.5 10*3/uL — ABNORMAL LOW (ref 0.9–3.3)

## 2013-06-11 ENCOUNTER — Other Ambulatory Visit: Payer: No Typology Code available for payment source

## 2013-06-11 ENCOUNTER — Ambulatory Visit: Payer: No Typology Code available for payment source | Admitting: Internal Medicine

## 2013-06-11 ENCOUNTER — Telehealth: Payer: Self-pay | Admitting: *Deleted

## 2013-06-11 ENCOUNTER — Ambulatory Visit: Payer: No Typology Code available for payment source

## 2013-06-11 NOTE — Telephone Encounter (Signed)
Pt called c/o that both legs swollen past week. Appt made for today 06/11/13 - unable to come today. Appt made 06/12/13 9:30AM Dr Virgina Organ. Anita Kidney Zyeir Dymek RN 06/11/13 9AM

## 2013-06-12 ENCOUNTER — Ambulatory Visit: Payer: No Typology Code available for payment source

## 2013-06-12 ENCOUNTER — Encounter: Payer: Self-pay | Admitting: Internal Medicine

## 2013-06-12 ENCOUNTER — Encounter (HOSPITAL_COMMUNITY): Payer: Self-pay | Admitting: Radiology

## 2013-06-12 ENCOUNTER — Ambulatory Visit (INDEPENDENT_AMBULATORY_CARE_PROVIDER_SITE_OTHER): Payer: No Typology Code available for payment source | Admitting: Internal Medicine

## 2013-06-12 ENCOUNTER — Ambulatory Visit (HOSPITAL_COMMUNITY)
Admission: RE | Admit: 2013-06-12 | Discharge: 2013-06-12 | Disposition: A | Discharge status: Home / Self Care | Payer: No Typology Code available for payment source | Source: Ambulatory Visit | Attending: Internal Medicine | Admitting: Internal Medicine

## 2013-06-12 ENCOUNTER — Inpatient Hospital Stay (HOSPITAL_COMMUNITY)
Admission: AD | Admit: 2013-06-12 | Discharge: 2013-06-14 | DRG: 309 | Disposition: A | Discharge status: Home / Self Care | Payer: Medicaid Other | Source: Ambulatory Visit | Attending: Internal Medicine | Admitting: Internal Medicine

## 2013-06-12 ENCOUNTER — Telehealth: Payer: Self-pay | Admitting: Internal Medicine

## 2013-06-12 ENCOUNTER — Inpatient Hospital Stay (HOSPITAL_COMMUNITY): Payer: Medicaid Other

## 2013-06-12 VITALS — BP 104/67 | HR 107 | Temp 97.8°F | Ht 65.0 in | Wt 193.2 lb

## 2013-06-12 DIAGNOSIS — C3492 Malignant neoplasm of unspecified part of left bronchus or lung: Secondary | ICD-10-CM

## 2013-06-12 DIAGNOSIS — C3490 Malignant neoplasm of unspecified part of unspecified bronchus or lung: Secondary | ICD-10-CM

## 2013-06-12 DIAGNOSIS — I2510 Atherosclerotic heart disease of native coronary artery without angina pectoris: Secondary | ICD-10-CM | POA: Diagnosis present

## 2013-06-12 DIAGNOSIS — I499 Cardiac arrhythmia, unspecified: Secondary | ICD-10-CM

## 2013-06-12 DIAGNOSIS — R609 Edema, unspecified: Secondary | ICD-10-CM

## 2013-06-12 DIAGNOSIS — I10 Essential (primary) hypertension: Secondary | ICD-10-CM

## 2013-06-12 DIAGNOSIS — J4489 Other specified chronic obstructive pulmonary disease: Secondary | ICD-10-CM | POA: Diagnosis present

## 2013-06-12 DIAGNOSIS — Z9221 Personal history of antineoplastic chemotherapy: Secondary | ICD-10-CM

## 2013-06-12 DIAGNOSIS — E1149 Type 2 diabetes mellitus with other diabetic neurological complication: Secondary | ICD-10-CM | POA: Diagnosis present

## 2013-06-12 DIAGNOSIS — Z7901 Long term (current) use of anticoagulants: Secondary | ICD-10-CM

## 2013-06-12 DIAGNOSIS — R600 Localized edema: Secondary | ICD-10-CM

## 2013-06-12 DIAGNOSIS — N3941 Urge incontinence: Secondary | ICD-10-CM

## 2013-06-12 DIAGNOSIS — Z833 Family history of diabetes mellitus: Secondary | ICD-10-CM

## 2013-06-12 DIAGNOSIS — Z885 Allergy status to narcotic agent status: Secondary | ICD-10-CM

## 2013-06-12 DIAGNOSIS — Z881 Allergy status to other antibiotic agents status: Secondary | ICD-10-CM

## 2013-06-12 DIAGNOSIS — G8929 Other chronic pain: Secondary | ICD-10-CM

## 2013-06-12 DIAGNOSIS — E669 Obesity, unspecified: Secondary | ICD-10-CM

## 2013-06-12 DIAGNOSIS — Z87891 Personal history of nicotine dependence: Secondary | ICD-10-CM

## 2013-06-12 DIAGNOSIS — F410 Panic disorder [episodic paroxysmal anxiety] without agoraphobia: Secondary | ICD-10-CM | POA: Diagnosis present

## 2013-06-12 DIAGNOSIS — M129 Arthropathy, unspecified: Secondary | ICD-10-CM | POA: Diagnosis present

## 2013-06-12 DIAGNOSIS — Z923 Personal history of irradiation: Secondary | ICD-10-CM

## 2013-06-12 DIAGNOSIS — I959 Hypotension, unspecified: Secondary | ICD-10-CM | POA: Diagnosis present

## 2013-06-12 DIAGNOSIS — IMO0001 Reserved for inherently not codable concepts without codable children: Secondary | ICD-10-CM

## 2013-06-12 DIAGNOSIS — J449 Chronic obstructive pulmonary disease, unspecified: Secondary | ICD-10-CM | POA: Diagnosis present

## 2013-06-12 DIAGNOSIS — E1142 Type 2 diabetes mellitus with diabetic polyneuropathy: Secondary | ICD-10-CM | POA: Diagnosis present

## 2013-06-12 DIAGNOSIS — I4891 Unspecified atrial fibrillation: Secondary | ICD-10-CM | POA: Insufficient documentation

## 2013-06-12 DIAGNOSIS — E785 Hyperlipidemia, unspecified: Secondary | ICD-10-CM

## 2013-06-12 DIAGNOSIS — K219 Gastro-esophageal reflux disease without esophagitis: Secondary | ICD-10-CM | POA: Diagnosis present

## 2013-06-12 DIAGNOSIS — F319 Bipolar disorder, unspecified: Secondary | ICD-10-CM | POA: Diagnosis present

## 2013-06-12 DIAGNOSIS — F411 Generalized anxiety disorder: Secondary | ICD-10-CM | POA: Diagnosis present

## 2013-06-12 LAB — GLUCOSE, CAPILLARY
Glucose-Capillary: 174 mg/dL — ABNORMAL HIGH (ref 70–99)
Glucose-Capillary: 165 mg/dL — ABNORMAL HIGH (ref 70–99)

## 2013-06-12 LAB — COMPREHENSIVE METABOLIC PANEL
Albumin: 3.8 g/dL (ref 3.5–5.2)
BUN: 7 mg/dL (ref 6–23)
Creatinine, Ser: 0.47 mg/dL — ABNORMAL LOW (ref 0.50–1.10)
Potassium: 3.5 mEq/L (ref 3.5–5.1)
Total Protein: 6.9 g/dL (ref 6.0–8.3)

## 2013-06-12 LAB — URINALYSIS, ROUTINE W REFLEX MICROSCOPIC
Nitrite: NEGATIVE
Specific Gravity, Urine: 1.02 (ref 1.005–1.030)
Urobilinogen, UA: 0.2 mg/dL (ref 0.0–1.0)

## 2013-06-12 LAB — MRSA PCR SCREENING: MRSA by PCR: NEGATIVE

## 2013-06-12 LAB — CBC
HCT: 30.7 % — ABNORMAL LOW (ref 36.0–46.0)
MCV: 104.4 fL — ABNORMAL HIGH (ref 78.0–100.0)
RBC: 2.94 MIL/uL — ABNORMAL LOW (ref 3.87–5.11)
WBC: 6.1 10*3/uL (ref 4.0–10.5)

## 2013-06-12 LAB — TROPONIN I: Troponin I: <0.3 ng/mL (ref <0.30)

## 2013-06-12 MED ORDER — ADULT MULTIVITAMIN W/MINERALS CH
1.0000 | ORAL_TABLET | Freq: Every day | ORAL | Status: DC
  Administered 2013-06-13 – 2013-06-14 (×2): 1 via ORAL
  Filled 2013-06-12 (×2): qty 1

## 2013-06-12 MED ORDER — HEPARIN (PORCINE) IN NACL 100-0.45 UNIT/ML-% IJ SOLN
1650.0000 [IU]/h | INTRAMUSCULAR | Status: DC
  Administered 2013-06-12: 1200 [IU]/h via INTRAVENOUS
  Administered 2013-06-13: 1500 [IU]/h via INTRAVENOUS
  Administered 2013-06-13: 1650 [IU]/h via INTRAVENOUS
  Filled 2013-06-12 (×4): qty 250

## 2013-06-12 MED ORDER — SODIUM CHLORIDE 0.9 % IJ SOLN: 3.0000 mL | INTRAMUSCULAR | Status: DC | PRN

## 2013-06-12 MED ORDER — VITAMIN D 1000 UNITS PO TABS
5000.0000 [IU] | ORAL_TABLET | Freq: Every day | ORAL | Status: DC
  Administered 2013-06-13 – 2013-06-14 (×2): 5000 [IU] via ORAL
  Filled 2013-06-12 (×2): qty 5

## 2013-06-12 MED ORDER — HEPARIN BOLUS VIA INFUSION
4000.0000 [IU] | Freq: Once | INTRAVENOUS | Status: AC
  Administered 2013-06-12: 4000 [IU] via INTRAVENOUS
  Filled 2013-06-12: qty 4000

## 2013-06-12 MED ORDER — DILTIAZEM HCL 25 MG/5ML IV SOLN
10.0000 mg | Freq: Once | INTRAVENOUS | Status: AC
  Administered 2013-06-12: 10 mg via INTRAVENOUS
  Filled 2013-06-12: qty 5

## 2013-06-12 MED ORDER — HEPARIN BOLUS VIA INFUSION
2300.0000 [IU] | INTRAVENOUS | Status: AC
  Administered 2013-06-12: 2300 [IU] via INTRAVENOUS
  Filled 2013-06-12: qty 2300

## 2013-06-12 MED ORDER — SODIUM CHLORIDE 0.9 % IJ SOLN
3.0000 mL | Freq: Two times a day (BID) | INTRAMUSCULAR | Status: DC
  Administered 2013-06-12 – 2013-06-14 (×5): 3 mL via INTRAVENOUS

## 2013-06-12 MED ORDER — SUCRALFATE 1 GM/10ML PO SUSP
1.0000 g | Freq: Four times a day (QID) | ORAL | Status: DC
  Administered 2013-06-12 – 2013-06-13 (×3): 1 g via ORAL
  Filled 2013-06-12 (×12): qty 10

## 2013-06-12 MED ORDER — PROCHLORPERAZINE MALEATE 10 MG PO TABS
10.0000 mg | ORAL_TABLET | Freq: Four times a day (QID) | ORAL | Status: DC | PRN
  Filled 2013-06-12: qty 1

## 2013-06-12 MED ORDER — OFF THE BEAT BOOK
Freq: Once | Status: AC
  Administered 2013-06-12: 22:00:00
  Filled 2013-06-12: qty 1

## 2013-06-12 MED ORDER — OXYBUTYNIN CHLORIDE 5 MG PO TABS
5.0000 mg | ORAL_TABLET | Freq: Three times a day (TID) | ORAL | Status: DC
  Administered 2013-06-13 – 2013-06-14 (×2): 5 mg via ORAL
  Filled 2013-06-12 (×9): qty 1

## 2013-06-12 MED ORDER — IOHEXOL 350 MG/ML SOLN
100.0000 mL | Freq: Once | INTRAVENOUS | Status: AC | PRN
  Administered 2013-06-12: 100 mL via INTRAVENOUS

## 2013-06-12 MED ORDER — DILTIAZEM HCL 30 MG PO TABS
30.0000 mg | ORAL_TABLET | Freq: Four times a day (QID) | ORAL | Status: DC
  Administered 2013-06-12 – 2013-06-14 (×6): 30 mg via ORAL
  Filled 2013-06-12 (×11): qty 1

## 2013-06-12 MED ORDER — SODIUM CHLORIDE 0.9 % IJ SOLN
3.0000 mL | Freq: Two times a day (BID) | INTRAMUSCULAR | Status: DC
  Administered 2013-06-12 – 2013-06-13 (×3): 3 mL via INTRAVENOUS

## 2013-06-12 MED ORDER — INSULIN ASPART 100 UNIT/ML ~~LOC~~ SOLN
0.0000 [IU] | Freq: Three times a day (TID) | SUBCUTANEOUS | Status: DC
  Administered 2013-06-13 – 2013-06-14 (×4): 2 [IU] via SUBCUTANEOUS

## 2013-06-12 MED ORDER — ACETAMINOPHEN 325 MG PO TABS
650.0000 mg | ORAL_TABLET | Freq: Four times a day (QID) | ORAL | Status: DC | PRN
  Administered 2013-06-13 (×3): 650 mg via ORAL
  Filled 2013-06-12 (×3): qty 2

## 2013-06-12 MED ORDER — ACETAMINOPHEN 650 MG RE SUPP: 650.0000 mg | Freq: Four times a day (QID) | RECTAL | Status: DC | PRN

## 2013-06-12 MED ORDER — FAMOTIDINE 20 MG PO TABS
20.0000 mg | ORAL_TABLET | Freq: Two times a day (BID) | ORAL | Status: DC
  Administered 2013-06-12 – 2013-06-14 (×4): 20 mg via ORAL
  Filled 2013-06-12 (×6): qty 1

## 2013-06-12 MED ORDER — SODIUM CHLORIDE 0.9 % IV SOLN
250.0000 mL | INTRAVENOUS | Status: DC | PRN
  Administered 2013-06-12: 10 mL/h via INTRAVENOUS

## 2013-06-12 NOTE — Consult Note (Signed)
ANTICOAGULATION CONSULT NOTE - Follow up Consult  Pharmacy Consult for Heparin Indication: new onset afib w/ RVR, r/o PE/DVT  Allergies  Allergen Reactions  . Ampicillin Hives  . Codeine Nausea And Vomiting         Patient Measurements: Height: 5' 4.96" (165 cm) Weight: 193 lb 2 oz (87.6 kg) IBW/kg (Calculated) : 56.91 Heparin Dosing Weight: ~76kg  Vital Signs: Temp: 97.6 F (36.4 C) (07/22 1948) Temp src: Oral (07/22 1948) BP: 136/88 mmHg (07/22 1834) Pulse Rate: 103 (07/22 1834)  Labs:  Recent Labs  06/12/13 1709 06/12/13 1711 06/12/13 1959  HGB  --  10.3*  --   HCT  --  30.7*  --   PLT  --  179  --   HEPARINUNFRC  --   --  0.10*  CREATININE  --  0.47*  --   TROPONINI <0.30  --  <0.30    Estimated Creatinine Clearance: 86.8 ml/min (by C-G formula based on Cr of 0.47).   Medical History: Past Medical History  Diagnosis Date  . Chronic pain of left knee     secondary to arthritis  . Hypertension   . Diabetic peripheral neuropathy   . GERD (gastroesophageal reflux disease)   . Diabetes mellitus type 2, uncontrolled, without complications DX: 2000    on oral medications only  . Constipation   . Anxiety   . Depression   . Panic attacks   . Bipolar 1 disorder   . COPD (chronic obstructive pulmonary disease)   . 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 Tyrone Sage (CVTS), Dr. Gwenyth Bouillon (Onc) // LLL mass noted 11/2012// PET scan 12/2012 - mediastinal + left infrahilar nodal metastasis AND low left mediastinum or posterior left pericardium is hypermetabolic likely nodal metastasis // Stage IIIA, T1bN2MX at diagnosis // Bronchoscopy 01/2013 confirmed small cell ca. // Rad tx 04-06/14 // chemo started 04/14  . Status post chemotherapy 03/01/2013-5/22/2014concurrent with Radiation Therapy    carboplatin for AUC of 5 on day 1 and etoposide 120 mg/M2 days 1, 2 and 3 with Neulasta support on day 4. The patient is status post 4 cycles. This is concurrent  with radiation.  . S/P radiation therapy 03/01/2013-04/12/2013    Left Lung and Nodes/ 62 Gy in 31 fractions   Assessment: The 6 hour heparin level result is 0.1 which is subtherapeutic on current heparin IV drip rate of 1200 units/hr in this 56yo female on heparin for afib and rule out PE/DVT. She is high risk for PE/DVT since she has SCLC currently undergoing chemotherapy.  Today the Hgb is 10.3 up from 8.8 last week and Plts is 179 up from 99 last week. Renal function is wnl; SCr 0.47,  CrCl ~ 87 ml/min.  No bleeding note.   No anticoagulants pta  Goal of Therapy:  Heparin level 0.3-0.7 units/ml Monitor platelets by anticoagulation protocol: Yes   Plan:  1) Heparin bolus 2300 units x 1 2) Heparin drip at 1500 units/hr 3) Check 6 hour heparin level 4) Daily heparin level and CBC  Noah Delaine, RPh Clinical Pharmacist Pager: [phone] 06/12/2013,8:58 PM

## 2013-06-12 NOTE — Assessment & Plan Note (Signed)
BP Readings from Last 3 Encounters:  06/12/13 117/67  06/12/13 104/67  05/24/13 121/76   Lab Results  Component Value Date   NA 137 06/04/2013   K 3.8 06/04/2013   CREATININE 0.6 06/04/2013   Assessment: Blood pressure control: controlled Progress toward BP goal:  at goal  Plan: Medications: not on medications at this time Educational resources provided:   Self management tools provided:   Other plans: caution for hypotension

## 2013-06-12 NOTE — Consult Note (Signed)
ANTICOAGULATION CONSULT NOTE - Initial Consult  Pharmacy Consult for Heparin Indication: new onset afib w/ RVR, r/o PE/DVT  Allergies  Allergen Reactions  . Ampicillin Hives  . Codeine Nausea And Vomiting         Patient Measurements: Height: 5' 4.96" (165 cm) Weight: 193 lb 2 oz (87.6 kg) IBW/kg (Calculated) : 56.91 Heparin Dosing Weight: ~76kg  Vital Signs: Temp: 97.8 F (36.6 C) (07/22 0937) Temp src: Oral (07/22 0937) BP: 104/67 mmHg (07/22 0937) Pulse Rate: 107 (07/22 0937)  Labs: No results found for this basename: HGB, HCT, PLT, APTT, LABPROT, INR, HEPARINUNFRC, CREATININE, CKTOTAL, CKMB, TROPONINI,  in the last 72 hours  Estimated Creatinine Clearance: 86.8 ml/min (by C-G formula based on Cr of 0.6).   Medical History: Past Medical History  Diagnosis Date  . Chronic pain of left knee     secondary to arthritis  . Hypertension   . Diabetic peripheral neuropathy   . GERD (gastroesophageal reflux disease)   . Diabetes mellitus type 2, uncontrolled, without complications DX: 2000    on oral medications only  . Constipation   . Anxiety   . Depression   . Panic attacks   . Bipolar 1 disorder   . COPD (chronic obstructive pulmonary disease)   . 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 Tyrone Sage (CVTS), Dr. Gwenyth Bouillon (Onc) // LLL mass noted 11/2012// PET scan 12/2012 - mediastinal + left infrahilar nodal metastasis AND low left mediastinum or posterior left pericardium is hypermetabolic likely nodal metastasis // Stage IIIA, T1bN2MX at diagnosis // Bronchoscopy 01/2013 confirmed small cell ca. // Rad tx 04-06/14 // chemo started 04/14  . Status post chemotherapy 03/01/2013-5/22/2014concurrent with Radiation Therapy    carboplatin for AUC of 5 on day 1 and etoposide 120 mg/M2 days 1, 2 and 3 with Neulasta support on day 4. The patient is status post 4 cycles. This is concurrent with radiation.  . S/P radiation therapy 03/01/2013-04/12/2013    Left Lung  and Nodes/ 62 Gy in 31 fractions   Assessment: 56yof presented to OP clinic today with bilateral LE swelling, occasional DOE, and tachycardia. Found to be in afib with RVR. She will now begin heparin for the afib. PE/DVT will also be ruled out as she is high risk since she has SCLC currently undergoing chemotherapy.  Labs haven't been drawn yet this admission but a week ago her Hgb was 8.8 and Plts 99. Renal function was wnl.  No anticoagulants pta  Goal of Therapy:  Heparin level 0.3-0.7 units/ml Monitor platelets by anticoagulation protocol: Yes   Plan:  1) Heparin bolus 4000 units x 1 2) Heparin drip at 1200 units/hr 3) Check 6 hour heparin level 4) Daily heparin level and CBC   Fredrik Rigger 06/12/2013,11:50 AM

## 2013-06-12 NOTE — Assessment & Plan Note (Signed)
Lab Results  Component Value Date   HGBA1C 9.9 04/05/2013   HGBA1C 10.2* 11/10/2012    Assessment: Diabetes control: poor control (HgbA1C >9%) Progress toward A1C goal:  unable to assess Comments: reports adherence to current regimen and claims CBGs at home improved, down to <200 no hypoglycemia reported.  Did not bring meter to visit today.   Plan: Medications:  continue current medications recheck a1c next month.  Glyburide, novolog, and metformin Home glucose monitoring: Frequency:   Timing:   Instruction/counseling given: reminded to bring blood glucose meter & log to each visit and reminded to bring medications to each visit

## 2013-06-12 NOTE — Progress Notes (Signed)
Subjective:   [name]   DOB: 1956/10/26 57 y.o.   MRN: [id number]  HPI: [name] is a 57 y.o. female with PMH of DM2, SCLC currently undergoing chemotherapy, and HTN, presenting to [facility] today for an acute visit. She reports lower extremity swelling, mildly improved today for the past 9 days.  She says she has had swelling one time before that was right after one of her chemotherapy sessions but never this bad.  Her last chemo session was on 05/21/13 cycle #5 with etoposide and carboplatin.  She denies chest pain, palpitations, dizziness, diaphoresis, fever, chills, nausea, vomiting, and sob at rest but does have DOE at times, she says it has not gotten worse and she has a an occasional cough that is worse at night.  No wheezing, chest pain, hemoptysis, but does have chronic fatigue and appears to have an irregular heart rhythm in clinic today along with tachycardia.  Left leg is lightly more painful than the right, but both are evenly swollen.  She denies excessive salt intake and is able to walk around.  She notes some improvement in swelling with elevation of her legs yesterday.     Past Medical History  Diagnosis Date  . Chronic pain of left knee     secondary to arthritis  . Hypertension   . Diabetic peripheral neuropathy   . GERD (gastroesophageal reflux disease)   . Diabetes mellitus type 2, uncontrolled, without complications DX: 2000    on oral medications only  . Constipation   . Anxiety   . Depression   . Panic attacks   . Bipolar 1 disorder   . COPD (chronic obstructive pulmonary disease)   . 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 Tyrone Sage (CVTS), Dr. Gwenyth Bouillon (Onc) // LLL mass noted 11/2012// PET scan 12/2012 - mediastinal + left infrahilar nodal metastasis AND low left mediastinum or posterior left pericardium is hypermetabolic likely nodal metastasis // Stage IIIA, T1bN2MX at diagnosis // Bronchoscopy 01/2013 confirmed  small cell ca. // Rad tx 04-06/14 // chemo started 04/14  . Status post chemotherapy 03/01/2013-5/22/2014concurrent with Radiation Therapy    carboplatin for AUC of 5 on day 1 and etoposide 120 mg/M2 days 1, 2 and 3 with Neulasta support on day 4. The patient is status post 4 cycles. This is concurrent with radiation.  . S/P radiation therapy 03/01/2013-04/12/2013    Left Lung and Nodes/ 62 Gy in 31 fractions   Current Outpatient Prescriptions  Medication Sig Dispense Refill  . Cholecalciferol (VITAMIN D-3) 5000 UNITS TABS Take 5,000 Units by mouth daily.      Marland Kitchen glyBURIDE (DIABETA) 5 MG tablet Take 1 tablet (5 mg total) by mouth 2 (two) times daily with a meal.  180 tablet  3  . metFORMIN (GLUCOPHAGE) 1000 MG tablet Take 1 tablet (1,000 mg total) by mouth 2 (two) times daily with a meal.  180 tablet  3  . Multiple Vitamin (MULTIVITAMIN WITH MINERALS) TABS Take 1 tablet by mouth daily.      . Omega-3 Fatty Acids (FISH OIL) 1200 MG CAPS Take 1,200 mg by mouth daily.      Marland Kitchen oxybutynin (DITROPAN) 5 MG tablet Take 1 tablet (5 mg total) by mouth 3 (three) times daily.  90 tablet  3  . prochlorperazine (COMPAZINE) 10 MG tablet Take 1 tablet (10 mg total) by mouth every 6 (six) hours as needed.  60 tablet  0  .  ranitidine (ZANTAC) 150 MG tablet Take 1 tablet (150 mg total) by mouth 2 (two) times daily.  60 tablet  5  . sucralfate (CARAFATE) 1 GM/10ML suspension Take 10 mLs (1 g total) by mouth 4 (four) times daily.  420 mL  1   No current facility-administered medications for this visit.   Family History  Problem Relation Age of Onset  . CAD Mother 71  . Diabetes Mother    History   Social History  . Marital Status: Divorced    Spouse Name: N/A    Number of Children: 2  . Years of Education: 10th grade   Occupational History  . 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 Smoking status: Former Smoker -- 0.50 packs/day for 40 years    Types:  Cigarettes    Quit date: 03/26/2013  . Smokeless tobacco: Never Used     Comment: 03/26/13 trying to quit approx 5 daily  . Alcohol Use: No  . Drug Use: No  . Sexually Active: None   Other Topics Concern  . None   Social History Narrative   Lives at [place] alone, 2 children, divorced         Review of Systems:  Constitutional:  Denies fever, chills, diaphoresis, appetite change and fatigue.   HEENT:  Denies congestion, sore throat, rhinorrhea, sneezing, mouth sores, trouble swallowing, neck pain   Respiratory:  DOE and occasional cough.  Denies SOB, and wheezing.   Cardiovascular:  Irregular rhythm and leg swelling.  Denies chest pain, palpitations.  Gastrointestinal:  Denies nausea, vomiting, abdominal pain, diarrhea, constipation, blood in stool and abdominal distention.   Genitourinary:  Incontinence.  Denies dysuria, frequency, hematuria, flank pain and difficulty urinating.   Musculoskeletal:  B/l lower extremity swelling. Denies myalgias, back pain, joint swelling, and gait problem.   Skin:  Denies pallor, rash and wound.   Neurological:  Denies dizziness, seizures, syncope, weakness, light-headedness, numbness and headaches.    Objective:  Physical Exam: Filed Vitals:   06/12/13 0937  BP: 104/67  Pulse: 107  Temp: 97.8 F (36.6 C)  TempSrc: Oral  Height: 5\' 5"  (1.651 m)  Weight: 193 lb 3.2 oz (87.635 kg)  SpO2: 97%   Vitals reviewed. General: sitting in wheelchair, NAD HEENT: PERRL, EOMI, no scleral icterus Cardiac: tachycardia, irregularly irregular, no rubs, murmurs Pulm: clear to auscultation bilaterally, no wheezes, rales, or rhonchi Abd: soft, nontender, obese, nondistended, BS present Ext: warm and well perfused, +1-2 b/l pitting edema up to knees with tenderness to palpation of b/l legs L>R. +2dp b/l Neuro: alert and oriented X3, cranial nerves II-XII grossly intact, strength 5/5 b/l upper and lower extremities, sensation grossly intact   Assessment &  Plan:  Discussed with Dr.Mullen  New Afib RVR with lower extremity edema and DOE in setting of recently diagnosed malignancy.  Concern for possible PE and/or DVT as well.  Will admit to IMTS

## 2013-06-12 NOTE — Assessment & Plan Note (Signed)
New onset x9 days.  Has had episode in the past when first started chemotherapy but not so severe.  Also noted to be in new onset AFIB RVR in clinic today.  Endorses DOE but denies orthopnea, chest pain, SOB at rest.  Does have intermittent cough.  Leg edema slightly improved per patient with elevation.  Legs are tender to palpation.    Possibly sign of new onset heart failure given malignancy.  Concern does remain for possible DVTs as well in the setting of ongoing malignancy.  Could also be secondary to chemotherapy, however, last session 6/30 and does not appear to be a side effect of current regimen.  -admit to IMTS for further evaluation

## 2013-06-12 NOTE — Assessment & Plan Note (Addendum)
Continue to follow with Dr. Arbutus Ped.  Has stopped smoking at this time.  Congratulated on efforts and hope it continues.  Admitted to IMTS today

## 2013-06-12 NOTE — H&P (Signed)
Date: 06/12/2013               Patient Name:  Anita Gardner MRN: [id number]  DOB: 1955-11-28 Age / Sex: 57 y.o., female   PCP: Windell Hummingbird, MD         Medical Service: Internal Medicine Teaching Service         Attending Physician: Dr. Rocco Serene, MD    First Contact: Dr. Aundria Rud Pager: [phone]  Second Contact: Dr. Manson Passey Pager: [phone]       After Hours (After 5p/  First Contact Pager: [phone]  weekends / holidays): Second Contact Pager: [phone]   Chief Complaint: bilateral leg edema  History of Present Illness:  The patient presents to the clinic today for some new leg edema. She notes that it started about 1 week ago and is waxing and waning. She has had some swelling in the past with some chemotherapy however not this bad. She is having some pain in her calves on the backs. 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 She is currently undergoing chemotherapy and finished radiation 4/14. She received her last dose of chemotherapy on 05/21/13 and is currently on carboplatin and etoposide. She also has PMH of diabetes mellitus type II, GERD, HTN, COPD (currently ex-smoker). She has not tried anything for the swelling. She noticed that the only new medicine is her oxybutynin for urinary frequency. She was seen in the clinic today and noted to have some dyspnea on exertion and new onset atrial fibrillation. She states that she has never had any heart problems or heart attacks and has not been told that she has a funny or different heart rhythm. She has not noticed any palpitations or racing of her heart recently. She has been taking her medications regularly. She denies any fevers or chills. She denies any dysuria or frequency. She states that she has some lab work that needs to be done tomorrow (her oncologist is Dr. Shirline Frees). She does not have any recent  travel. She states that she is able to walk about 100 feet without getting too winded but has noted some decreased energy and stamina since the cancer treatment started. She has occasional nausea but no vomiting. She has noticed that foods affect her bowels more than before cancer treatment and had several episodes of diarrhea yesterday after eating raw tomato but no episodes today. She has not eaten breakfast yet today since she was having her appointment. She denies headache or congestion. She has a chronic cough which is unchanged and not productive. She is not having chest pain and denies SOB. She has no new rashes or skin breakdown. She denies abdominal pain. She denies any numbness, new weakness, change in speech. She denies sweats or arm tingling.   Meds: Current Facility-Administered Medications  Medication Dose Route Frequency Provider Last Rate Last Dose  . 0.9 %  sodium chloride infusion  250 mL Intravenous PRN Judie Bonus, MD      . acetaminophen (TYLENOL) tablet 650 mg  650 mg Oral Q6H PRN Judie Bonus, MD       Or  . acetaminophen (TYLENOL) suppository 650 mg  650 mg Rectal Q6H PRN Judie Bonus, MD      . diltiazem (CARDIZEM) injection 10 mg  10 mg Intravenous Once Judie Bonus, MD      . famotidine (PEPCID) tablet 20 mg  20  mg Oral BID Judie Bonus, MD      . insulin aspart (novoLOG) injection 0-9 Units  0-9 Units Subcutaneous TID WC Judie Bonus, MD      . multivitamin with minerals tablet 1 tablet  1 tablet Oral Daily Judie Bonus, MD      . oxybutynin (DITROPAN) tablet 5 mg  5 mg Oral TID Judie Bonus, MD      . prochlorperazine (COMPAZINE) tablet 10 mg  10 mg Oral Q6H PRN Judie Bonus, MD      . sodium chloride 0.9 % injection 3 mL  3 mL Intravenous Q12H Judie Bonus, MD      . sodium chloride 0.9 % injection 3 mL  3 mL Intravenous PRN Judie Bonus, MD      . sodium chloride 0.9 % injection 3 mL  3 mL Intravenous  Q12H Judie Bonus, MD      . sucralfate (CARAFATE) 1 GM/10ML suspension 1 g  1 g Oral QID Judie Bonus, MD      . Vitamin D-3 TABS 5,000 Units  5,000 Units Oral Daily Judie Bonus, MD       Current Outpatient Prescriptions  Medication Sig Dispense Refill  . Cholecalciferol (VITAMIN D-3) 5000 UNITS TABS Take 5,000 Units by mouth daily.      Marland Kitchen glyBURIDE (DIABETA) 5 MG tablet Take 1 tablet (5 mg total) by mouth 2 (two) times daily with a meal.  180 tablet  3  . metFORMIN (GLUCOPHAGE) 1000 MG tablet Take 1 tablet (1,000 mg total) by mouth 2 (two) times daily with a meal.  180 tablet  3  . Multiple Vitamin (MULTIVITAMIN WITH MINERALS) TABS Take 1 tablet by mouth daily.      . Omega-3 Fatty Acids (FISH OIL) 1200 MG CAPS Take 1,200 mg by mouth daily.      Marland Kitchen oxybutynin (DITROPAN) 5 MG tablet Take 1 tablet (5 mg total) by mouth 3 (three) times daily.  90 tablet  3  . prochlorperazine (COMPAZINE) 10 MG tablet Take 1 tablet (10 mg total) by mouth every 6 (six) hours as needed.  60 tablet  0  . ranitidine (ZANTAC) 150 MG tablet Take 1 tablet (150 mg total) by mouth 2 (two) times daily.  60 tablet  5  . sucralfate (CARAFATE) 1 GM/10ML suspension Take 10 mLs (1 g total) by mouth 4 (four) times daily.  420 mL  1    Allergies: Allergies as of 06/12/2013 - Review Complete 06/12/2013  Allergen Reaction Noted  . Ampicillin Hives 11/10/2012  . Codeine Nausea And Vomiting 11/29/2012   Past Medical History  Diagnosis Date  . Chronic pain of left knee     secondary to arthritis  . Hypertension   . Diabetic peripheral neuropathy   . GERD (gastroesophageal reflux disease)   . Diabetes mellitus type 2, uncontrolled, without complications DX: 2000    on oral medications only  . Constipation   . Anxiety   . Depression   . Panic attacks   . Bipolar 1 disorder   . COPD (chronic obstructive pulmonary disease)   . 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 Tyrone Sage (CVTS), Dr.  Gwenyth Bouillon (Onc) // LLL mass noted 11/2012// PET scan 12/2012 - mediastinal + left infrahilar nodal metastasis AND low left mediastinum or posterior left pericardium is hypermetabolic likely nodal metastasis // Stage IIIA, T1bN2MX at diagnosis // Bronchoscopy 01/2013 confirmed small cell ca. //  Rad tx 04-06/14 // chemo started 04/14  . Status post chemotherapy 03/01/2013-5/22/2014concurrent with Radiation Therapy    carboplatin for AUC of 5 on day 1 and etoposide 120 mg/M2 days 1, 2 and 3 with Neulasta support on day 4. The patient is status post 4 cycles. This is concurrent with radiation.  . S/P radiation therapy 03/01/2013-04/12/2013    Left Lung and Nodes/ 62 Gy in 31 fractions   Past Surgical History  Procedure Laterality Date  . Left leg surgery      as a child  . Video bronchoscopy with endobronchial ultrasound N/A 02/14/2013    Procedure: VIDEO BRONCHOSCOPY WITH ENDOBRONCHIAL ULTRASOUND;  Surgeon: Delight Ovens, MD;  Location: [facility];  Service: Thoracic;  Laterality: N/A;   Family History  Problem Relation Age of Onset  . CAD Mother 53  . Diabetes Mother    History   Social History  . Marital Status: Divorced    Spouse Name: N/A    Number of Children: 2  . Years of Education: 10th grade   Occupational History  . 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 Smoking status: Former Smoker -- 0.50 packs/day for 40 years    Types: Cigarettes    Quit date: 03/26/2013  . Smokeless tobacco: Never Used     Comment: 03/26/13 trying to quit approx 5 daily  . Alcohol Use: No  . Drug Use: No  . Sexually Active: Not on file   Other Topics Concern  . Not on file   Social History Narrative   Lives at [place] alone, 2 children, divorced          Review of Systems: A comprehensive 10 point review of systems was performed and other than pertinent positives and negatives listed above in HPI was negative.   Physical Exam: There  were no vitals taken for this visit. Temp 97.8 F, HR 107-110s, BP 104/67, Oxygen saturation 97% on room air General: sitting in wheelchair, pleasant, slightly tired, some sweat on skin HEENT: PERRL, EOMI, no scleral icterus Cardiac: irregularly irregular, no obvious murmurs, pulse irregular, no carotid bruits Pulm: clear to auscultation bilaterally, moving normal volumes of air Abd: soft, nontender, nondistended, BS present Ext: warm and well perfused, 2+ pitting edema bilateral lower extremities to level of knee, pulses good in DP and PT bilaterally Neuro: alert and oriented X3, cranial nerves II-XII grossly intact, strength 4/5 bilateral lower extremities and upper extremities  Lab results: Basic Metabolic Panel: Ordered  CBC: Ordered  Cardiac Enzymes: Ordered  CBG:  Recent Labs  06/12/13 0955  GLUCAP 169*   Imaging results:  No results found.  Other results: EKG: atrial fibrillation, rate 114, no ST elevation or depression, intervals are normal.  Assessment & Plan by Problem:  Atrial fibrillation with RVR - Rate not terribly high (low 110s) and will try IV diltiazem bolus. Will admit to stepdown for observation and if needed diltiazem drip for rate control. Concern is why she converted to atrial fibrillation. Concern for underlying PE given new leg swelling and current cancer. She is not hypoxic but is moderate risk for PE with wells 5.5. Given current cancer D-dimer is not likely to be helpful and so will get CTangio chest. Will anticoagulate. CT angio chest will also help [place] clarify whether cancer in lower mediastinum/pericardium may have spread or infiltrated. Also concern for ACS given diabetes could be silent MI causing new Atrial Fibrillation. -Heparin  drip -IV diltiazem 10 mg now -Monitor in stepdown -CE times 3 -CT angio chest to rule out PE -No infectious etiology apparent  Bilateral leg edema - Concern for DVT given active malignancy. Will check for PE however  if no PE is found may still be worthwhile to rule out DVT given new and recent swelling. Although if heart failure due to cancer infiltration is found on CT angio then this may explain the leg edema. She is not on any new medications that could explain the swelling. -As above but observe for now, on heparin drip currently  Hypertension - Hold home meds for now as more hypotensive.  Diabetes mellitus type 2, uncontrolled, without complications - Will use SSI as she will be getting contrast with CT angio chest. -SSI sensitive -No lantus for now  Small cell lung carcinoma - Stage III a on presentation. Has undergone radiation and chemotherapy. Some question on pericardium versus mediastinal hypermetabolic activity on last PET scan. Will clarify with CT angio to rule out PE. Active cancer status. Will check CBC for cell counts. She sees Dr. Shirline Frees as out-patient.  -CBC  Urge incontinence - She uses oxybutynin TID 5 mg at home and will continue. Will check urinalysis for UTI given she is still having some frequency although it is unchanged and without burning. -U/A  DVT ppx - heparin drip   Dispo: Disposition is deferred at this time, awaiting improvement of current medical problems. Anticipated discharge in approximately 2-3 day(s).   The patient does have a current PCP Windell Hummingbird, MD) and does need an [facility] hospital follow-up appointment after discharge.  The patient does have transportation limitations that hinder transportation to clinic appointments.  Signed: Judie Bonus, MD 06/12/2013, 11:31 AM

## 2013-06-12 NOTE — Care Management Note (Signed)
    Page 1 of 1   06/12/2013     12:45:29 PM   CARE MANAGEMENT NOTE 06/12/2013  Patient:  Anita Gardner, Anita Gardner   Account Number:  [id number]  Date Initiated:  06/12/2013  Documentation initiated by:  Junius Creamer  Subjective/Objective Assessment:   adm w at fib     Action/Plan:   lives w fam, pcp dr Fleet Contras Darci Needle   Anticipated DC Date:     Anticipated DC Plan:        DC Planning Services  CM consult      Choice offered to / List presented to:             Status of service:   Medicare Important Message given?   (If response is "NO", the following Medicare IM given date fields will be blank) Date Medicare IM given:   Date Additional Medicare IM given:    Discharge Disposition:    Per UR Regulation:  Reviewed for med. necessity/level of care/duration of stay  If discussed at Long Length of Stay Meetings, dates discussed:    Comments:

## 2013-06-12 NOTE — Assessment & Plan Note (Addendum)
New onset noted in clinic visit today and confirmed by EKG HR 113.  Also endorses worsening lower extremity swelling and has DOE.  Undergoing cycle 5 of chemotherapy as well in setting of SCC.  Wells score: 2.5 although does have lower extremity edema and minimal tenderness which could make her 5.5 making PE likely given possibility of DVT with lower extremity edema (although bilateral but left more tender than right, no visible erythema).  [place] score 7, intermediate probability.   -admit to IMTS

## 2013-06-12 NOTE — Telephone Encounter (Signed)
pt called to r/s missed lab....Done °

## 2013-06-12 NOTE — Progress Notes (Signed)
Pt [facility]; requested to cancel appt.  I called and left a message w/Melissa to cance the appt.

## 2013-06-13 ENCOUNTER — Ambulatory Visit: Payer: No Typology Code available for payment source

## 2013-06-13 ENCOUNTER — Other Ambulatory Visit: Payer: No Typology Code available for payment source

## 2013-06-13 DIAGNOSIS — I369 Nonrheumatic tricuspid valve disorder, unspecified: Secondary | ICD-10-CM

## 2013-06-13 DIAGNOSIS — R609 Edema, unspecified: Secondary | ICD-10-CM

## 2013-06-13 DIAGNOSIS — I4891 Unspecified atrial fibrillation: Principal | ICD-10-CM

## 2013-06-13 DIAGNOSIS — C3490 Malignant neoplasm of unspecified part of unspecified bronchus or lung: Secondary | ICD-10-CM

## 2013-06-13 DIAGNOSIS — E119 Type 2 diabetes mellitus without complications: Secondary | ICD-10-CM

## 2013-06-13 LAB — CBC
Hemoglobin: 9.2 g/dL — ABNORMAL LOW (ref 12.0–15.0)
MCH: 34.8 pg — ABNORMAL HIGH (ref 26.0–34.0)
Platelets: 211 10*3/uL (ref 150–400)
RBC: 2.64 MIL/uL — ABNORMAL LOW (ref 3.87–5.11)
WBC: 5.8 10*3/uL (ref 4.0–10.5)

## 2013-06-13 LAB — BASIC METABOLIC PANEL
Calcium: 9.4 mg/dL (ref 8.4–10.5)
GFR calc Af Amer: >90 mL/min (ref >90)
GFR calc non Af Amer: >90 mL/min (ref >90)
Glucose, Bld: 160 mg/dL — ABNORMAL HIGH (ref 70–99)
Potassium: 3.6 mEq/L (ref 3.5–5.1)
Sodium: 136 mEq/L (ref 135–145)

## 2013-06-13 LAB — TROPONIN I: Troponin I: <0.3 ng/mL (ref <0.30)

## 2013-06-13 LAB — GLUCOSE, CAPILLARY
Glucose-Capillary: 153 mg/dL — ABNORMAL HIGH (ref 70–99)
Glucose-Capillary: 178 mg/dL — ABNORMAL HIGH (ref 70–99)

## 2013-06-13 LAB — PROTIME-INR
INR: 1.07 (ref 0.00–1.49)
Prothrombin Time: 13.7 seconds (ref 11.6–15.2)

## 2013-06-13 LAB — HEPARIN LEVEL (UNFRACTIONATED): Heparin Unfractionated: 0.2 IU/mL — ABNORMAL LOW (ref 0.30–0.70)

## 2013-06-13 MED ORDER — HEPARIN (PORCINE) IN NACL 100-0.45 UNIT/ML-% IJ SOLN
1850.0000 [IU]/h | INTRAMUSCULAR | Status: DC
  Administered 2013-06-13: 1850 [IU]/h via INTRAVENOUS

## 2013-06-13 MED ORDER — HEPARIN SODIUM (PORCINE) 5000 UNIT/ML IJ SOLN
5000.0000 [IU] | Freq: Three times a day (TID) | INTRAMUSCULAR | Status: DC
  Administered 2013-06-13 – 2013-06-14 (×2): 5000 [IU] via SUBCUTANEOUS
  Filled 2013-06-13 (×5): qty 1

## 2013-06-13 NOTE — Progress Notes (Signed)
Bilateral lower extremity venous duplex:  No evidence of DVT, superficial thrombosis, or Baker's Cyst.   

## 2013-06-13 NOTE — Progress Notes (Signed)
Subjective: Anita Gardner is doing well this morning, still has bilateral leg swelling.  No chest pain or palpitations. HR has been 90s-100s and systolic BP 110s-130s since yesterday afternoon.    Objective: Vital signs in last 24 hours: Filed Vitals:   06/13/13 0516 06/13/13 0809 06/13/13 0810 06/13/13 1218  BP: 115/84 121/75  115/81  Pulse:   89 99  Temp:   97.6 F (36.4 C) 98.5 F (36.9 C)  TempSrc:   Oral Oral  Resp:   21 23  Height:      Weight:      SpO2:   97% 93%   Weight change:   Intake/Output Summary (Last 24 hours) at 06/13/13 1609 Last data filed at 06/13/13 1525  Gross per 24 hour  Intake 2346.65 ml  Output   3850 ml  Net -1503.35 ml   PEX General: alert, cooperative, and in no apparent distress HEENT: vision grossly intact, oropharynx clear and non-erythematous  Neck: supple, no lymphadenopathy, JVD, or carotid bruits Lungs: clear to ascultation bilaterally, normal work of respiration, no wheezes, rales, ronchi Heart: irregularly irregular rate, no murmurs, gallops, or rubs Abdomen: soft, non-tender, non-distended, normal bowel sounds Extremities: 2+ pitting edema bilateral lower extremities to knees, 2+ DP/PT pulses bilaterally Neurologic: alert & oriented X3, cranial nerves II-XII intact, strength grossly intact, sensation intact to light touch  Lab Results: Basic Metabolic Panel:  Recent Labs Lab 06/12/13 1711 06/13/13 0407  NA 133* 136  K 3.5 3.6  CL 97 98  CO2 25 26  GLUCOSE 94 160*  BUN 7 7  CREATININE 0.47* 0.55  CALCIUM 9.4 9.4   Liver Function Tests:  Recent Labs Lab 06/12/13 1711  AST 18  ALT 25  ALKPHOS 93  BILITOT 0.5  PROT 6.9  ALBUMIN 3.8   CBC:  Recent Labs Lab 06/12/13 1711 06/13/13 0407  WBC 6.1 5.8  HGB 10.3* 9.2*  HCT 30.7* 27.9*  MCV 104.4* 105.7*  PLT 179 211   Cardiac Enzymes:  Recent Labs Lab 06/12/13 1709 06/12/13 1959 06/12/13 2357  TROPONINI <0.30 <0.30 <0.30   CBG:  Recent Labs Lab  06/12/13 0955 06/12/13 1215 06/12/13 1727 06/12/13 2153 06/13/13 0812 06/13/13 1220  GLUCAP 169* 174* 95 165* 153* 178*   Thyroid Function Tests:  Recent Labs Lab 06/12/13 1711  TSH 0.868   Coagulation:  Recent Labs Lab 06/13/13 0407  LABPROT 13.7  INR 1.07  Urinalysis:  Recent Labs Lab 06/12/13 1404  COLORURINE YELLOW  LABSPEC 1.020  PHURINE 6.0  GLUCOSEU NEGATIVE  HGBUR NEGATIVE  BILIRUBINUR NEGATIVE  KETONESUR NEGATIVE  PROTEINUR NEGATIVE  UROBILINOGEN 0.2  NITRITE NEGATIVE  LEUKOCYTESUR NEGATIVE   Micro Results: Recent Results (from the past 240 hour(s))  MRSA PCR SCREENING     Status: None   Collection Time    06/12/13 12:12 PM      Result Value Range Status   MRSA by PCR NEGATIVE  NEGATIVE Final   Comment:            The GeneXpert MRSA Assay (FDA     approved for NASAL specimens     only), is one component of a     comprehensive MRSA colonization     surveillance program. It is not     intended to diagnose MRSA     infection nor to guide or     monitor treatment for     MRSA infections.   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 Bilateral leg swelling.  New onset of atrial fibrillation.  CT ANGIOGRAPHY CHEST  Technique:  Multidetector CT imaging of the chest using the standard protocol during bolus administration of intravenous contrast. Multiplanar reconstructed images including MIPs were obtained and reviewed to evaluate the vascular anatomy.  Contrast: OMNIPAQUE IOHEXOL 350 MG/ML SOLN  Comparison: CT of the thorax 04/09/2013.  Findings:  Mediastinum: Study is slightly limited by patient respiratory motion.  With these limitations in mind, there is no evidence to suggest clinically relevant central, lobar or segmental sized filling defect.  Smaller subsegmental sized filling defects cannot be entirely excluded. Heart size is mildly  enlarged. Small amount of pericardial fluid and/or thickening anteriorly, unlikely to be of any hemodynamic significance at this time.  No associated pericardial calcification. There is atherosclerosis of the thoracic aorta, the great vessels of the mediastinum and the coronary arteries, including calcified atherosclerotic plaque in the left anterior descending, left circumflex and right coronary arteries. Mild calcifications of the mitral valve.  Prominent lymphoid tissue in the left hilar region appears slightly larger than prior examinations, with the largest node or nodal conglomerate measuring up to 1.9 x 1.7 cm on image 42 of series 4.  No definite pathologically enlarged mediastinal or right hilar lymph nodes are noted on today's examination.  Mild circumferential thickening of the distal two thirds of the esophagus may suggest esophagitis.  Lungs/Pleura: A small pleural-based nodule is again noted in the periphery of the left lower lobe measuring 1.2 x 0.6 cm on today's examination (image 59 of series 6), which is similar in size to the prior study when allowing for slight differences in measurement technique.  Compared to the prior examination there is now extensive thickening of the bronchial walls and mild thickening of the peribronchovascular interstitium adjacent to the left lower lobe bronchi which appear narrowed on today's study.  Several of these bronchi appear to be filled with fluid, likely retained secretions.  There is septal thickening throughout the inferior aspect of the left lower lobe, suggesting some regional edema.  The left lower lobe also appears slightly hyperlucent, which may be a reflection of some air trapping related to the previously discussed bronchial findings.  Mild diffuse bronchial wall thickening is noted in other regions of the lungs bilaterally as well.  No other definite new suspicious appearing pulmonary nodules or masses are identified.  There is a small left pleural  effusion layering dependently which is simple in appearance.  Upper Abdomen: Unremarkable.  Musculoskeletal: There are no aggressive appearing lytic or blastic lesions noted in the visualized portions of the skeleton.  IMPRESSION: 1.  Despite the limitations of today's examination there is no evidence to suggest clinically relevant central, lobar or segmental sized pulmonary embolism. 2.  Left lower lobe pulmonary nodule is similar in size to the prior study, but is now associated with surrounding septal thickening suggestive of some regional edema. There is also a new small left pleural effusion which is simple in appearance. In addition, there is increasing bronchial wall thickening and mild thickening of the peribronchovascular interstitium throughout the left lower lobe resulting in significant air trapping.  These findings may be related to prior radiation therapy. 2.  Left hilar adenopathy appears increased compared to the prior study.  Whether not this is neoplastic or simply reactive to the process in the left lower lobe is uncertain, but attention on follow-up studies is strongly  recommended. 4.  Circumferential thickening of the distal two thirds of the esophagus suggestive of esophagitis, which may be treatment related. 5.  Cardiomegaly with a small amount of pericardial fluid and/or thickening, which is unlikely to be of hemodynamic significance at this time. 6. Atherosclerosis, including three-vessel coronary artery disease. Please note that although the presence of coronary artery calcium documents the presence of coronary artery disease, the severity of this disease and any potential stenosis cannot be assessed on this non-gated CT examination.  Assessment for potential risk factor modification, dietary therapy or pharmacologic therapy may be warranted, if clinically indicated. 7.  Additional incidental findings, as above.   Original Report Authenticated By: Trudie Reed, M.D.   Medications: I  have reviewed the patient's current medications. Scheduled Meds: . cholecalciferol  5,000 Units Oral Daily  . diltiazem  30 mg Oral Q6H  . famotidine  20 mg Oral BID  . insulin aspart  0-9 Units Subcutaneous TID WC  . multivitamin with minerals  1 tablet Oral Daily  . oxybutynin  5 mg Oral TID  . sodium chloride  3 mL Intravenous Q12H  . sodium chloride  3 mL Intravenous Q12H  . sucralfate  1 g Oral QID   Continuous Infusions: . heparin 1,850 Units/hr (06/13/13 1525)   PRN Meds:.sodium chloride, acetaminophen, acetaminophen, prochlorperazine, sodium chloride Assessment/Plan:  #Atrial fibrillation- Concern for new onset atrial fibrillation (without RVR). Pt received IV diltiazem 10 mg bolus at admission, immediate release diltiazem 30 mg q6hr since last night. Pt had CTA given new leg swelling and current cancer with Well's score of 5.5 (moderate risk), negative for PE.  Pt has been on heparin drip for anticoagulation.  Also concern for ACS given diabetes could cause silent MI leading to new atrial fibrillation. Troponin x 3 negative. TSH within normal limits.  Biilateral lower extremity dopplers today negative for DVT or superficial thrombosis. -d/c heparin gtt since LE dopplers negative -continue diltiazem at, will convert to extended release 120 mg daily at discharge -will discuss Coumadin therapy for long term anti-coagulation  -echo results pending  #Bilateral leg edema - Concern for DVT given active malignancy. CTA and bilateral lower extremity dopplers negative per above.  She is not on any new medications that could explain the swelling.  -ordered TED hose -if edema not improved tomorrow morning, will consider starting low dose lasix  #Hypertension - Hold home meds for now as more hypotensive.   #DM 2- Last A1C 9.9 on 04/05/13.  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 Has undergone radiation (finished in 4/14) and  chemotherapy (carboplatin, etoposide, neulasta). Some question on pericardium versus mediastinal hypermetabolic activity on last PET scan. Active cancer status, she sees Dr. Shirline Frees as out-patient.   Urge incontinence- Pt uses oxybutynin TID 5 mg at home. UA negative. -continue oxybutinin  DVT ppx - lovenox   Dispo: Disposition tomorrow.   The patient does have a current PCP Anita Hummingbird, MD) and does need an [facility] hospital follow-up appointment after discharge.  The patient does not have transportation limitations that hinder transportation to clinic appointments.  .Services Needed at time of discharge: Y = Yes, Blank = No PT:   OT:   RN:   Equipment:   Other:     LOS: 1 day   Rocco Serene, MD 06/13/2013, 4:09 PM

## 2013-06-13 NOTE — H&P (Signed)
I saw and evaluated the patient. I reviewed the resident's note and confirmed the resident's findings.  I agree with the assessment and plan as documented in the resident's note except that Anita Gardner has limited stage small cell lung cancer.  Briefly, Anita Gardner is a 57 yo woman with limited stage small cell lung cancer s/p chemo/XRT for cure who presents with BLE edema and was found to be in new onset atrial fibrillation.  She was admitted to rule out PE/DVT and further assess/treat the atrial fibrillation.  Her rate has been controlled with oral diltiazem and she has been started on coumadin for long-term anticoagulation.  PE/DVT have been ruled out and the results of the Echo to assess the reason for the pericardial thickening (and possible reason for the a-fib) are pending.  The patient is feeling well and if her rate remains controlled overnight will be stable for discharge in the morning.

## 2013-06-13 NOTE — Consult Note (Signed)
ANTICOAGULATION CONSULT NOTE - Follow up Consult  Pharmacy Consult for Heparin Indication: new onset afib w/ RVR, r/o PE/DVT  Allergies  Allergen Reactions  . Ampicillin Hives  . Codeine Nausea And Vomiting        Patient Measurements: Height: 5' 4.96" (165 cm) Weight: 193 lb 2 oz (87.6 kg) IBW/kg (Calculated) : 56.91 Heparin Dosing Weight: ~76kg  Vital Signs: Temp: 97.8 F (36.6 C) (07/23 0345) Temp src: Oral (07/23 0345) BP: 115/84 mmHg (07/23 0516) Pulse Rate: 84 (07/23 0345)  Labs:  Recent Labs  06/12/13 1709 06/12/13 1711 06/12/13 1959 06/12/13 2357 06/13/13 0407  HGB  --  10.3*  --   --  9.2*  HCT  --  30.7*  --   --  27.9*  PLT  --  179  --   --  211  LABPROT  --   --   --   --  13.7  INR  --   --   --   --  1.07  HEPARINUNFRC  --   --  0.10*  --  0.20*  CREATININE  --  0.47*  --   --   --   TROPONINI <0.30  --  <0.30 <0.30  --    Estimated Creatinine Clearance: 86.8 ml/min (by C-G formula based on Cr of 0.47).  Medical History: Past Medical History  Diagnosis Date  . Chronic pain of left knee     secondary to arthritis  . Hypertension   . Diabetic peripheral neuropathy   . GERD (gastroesophageal reflux disease)   . Diabetes mellitus type 2, uncontrolled, without complications DX: 2000    on oral medications only  . Constipation   . Anxiety   . Depression   . Panic attacks   . Bipolar 1 disorder   . COPD (chronic obstructive pulmonary disease)   . 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 Tyrone Sage (CVTS), Dr. Gwenyth Bouillon (Onc) // LLL mass noted 11/2012// PET scan 12/2012 - mediastinal + left infrahilar nodal metastasis AND low left mediastinum or posterior left pericardium is hypermetabolic likely nodal metastasis // Stage IIIA, [id number] at diagnosis // Bronchoscopy 01/2013 confirmed small cell ca. // Rad tx 04-06/14 // chemo started 04/14  . Status post chemotherapy 03/01/2013-5/22/2014concurrent with Radiation Therapy    carboplatin  for AUC of 5 on day 1 and etoposide 120 mg/M2 days 1, 2 and 3 with Neulasta support on day 4. The patient is status post 4 cycles. This is concurrent with radiation.  . S/P radiation therapy 03/01/2013-04/12/2013    Left Lung and Nodes/ 62 Gy in 31 fractions   Assessment: 57 yo female, admitted with Afib and started on IV heparin.  Her initial level was sub-therapeutic and rater was increased to 1500 units/hr.  Her repeat level is still below desired goal range at 0.2 IU/ml.  She is high risk for PE/DVT since she has SCLC currently undergoing chemotherapy.  Spoke with her nurse who states there have been no IV heparin complications or bleeding.  She has had some drop in her H/H down to 9.2 indicating some mild anemia.     No anticoagulants pta  Goal of Therapy:  Heparin level 0.3-0.7 units/ml Monitor platelets by anticoagulation protocol: Yes   Plan:  1)  Increase IV heparin to 1650 units./hr.   2)  Check 8 hour heparin level 3)  Daily heparin level and CBC  Nadara Mustard, PharmD., MS Clinical Pharmacist Pager:  [phone] Thank you for  allowing pharmacy to be part of this patients care team. 06/13/2013,5:27 AM

## 2013-06-13 NOTE — Progress Notes (Signed)
  Echocardiogram 2D Echocardiogram has been performed.  Arvil Chaco 06/13/2013, 3:30 PM

## 2013-06-13 NOTE — Progress Notes (Signed)
ANTICOAGULATION CONSULT NOTE - Follow Up Consult  Pharmacy Consult for Heparin Indication: atrial fibrillation  Allergies  Allergen Reactions  . Ampicillin Hives  . Codeine Nausea And Vomiting         Patient Measurements: Height: 5' 4.96" (165 cm) Weight: 193 lb 2 oz (87.6 kg) IBW/kg (Calculated) : 56.91 Heparin Dosing Weight: 76kg  Vital Signs: Temp: 98.5 F (36.9 C) (07/23 1218) Temp src: Oral (07/23 1218) BP: 115/81 mmHg (07/23 1218) Pulse Rate: 99 (07/23 1218)  Labs:  Recent Labs  06/12/13 1709 06/12/13 1711 06/12/13 1959 06/12/13 2357 06/13/13 0407 06/13/13 1430  HGB  --  10.3*  --   --  9.2*  --   HCT  --  30.7*  --   --  27.9*  --   PLT  --  179  --   --  211  --   LABPROT  --   --   --   --  13.7  --   INR  --   --   --   --  1.07  --   HEPARINUNFRC  --   --  0.10*  --  0.20* 0.24*  CREATININE  --  0.47*  --   --  0.55  --   TROPONINI <0.30  --  <0.30 <0.30  --   --     Estimated Creatinine Clearance: 86.8 ml/min (by C-G formula based on Cr of 0.55).   Medications:  Heparin @ 1650 units/hr  Assessment: 56yof continues on heparin for new onset afib. PE and DVT have both been ruled out. Heparin level is still below goal despite rate increase this morning. No issues with infusion. CBC stable. No bleeding reported.  Goal of Therapy:  Heparin level 0.3-0.7 units/ml Monitor platelets by anticoagulation protocol: Yes   Plan:  1) Increase heparin to 1850 units/hr 2) Heparin level in 6 hours  Fredrik Rigger 06/13/2013,3:20 PM

## 2013-06-14 ENCOUNTER — Ambulatory Visit: Payer: No Typology Code available for payment source

## 2013-06-14 LAB — GLUCOSE, CAPILLARY: Glucose-Capillary: 192 mg/dL — ABNORMAL HIGH (ref 70–99)

## 2013-06-14 MED ORDER — DILTIAZEM HCL ER COATED BEADS 120 MG PO CP24: 120.0000 mg | ORAL_CAPSULE | Freq: Every day | ORAL | Status: DC

## 2013-06-14 MED ORDER — WARFARIN SODIUM 5 MG PO TABS: 5.0000 mg | ORAL_TABLET | Freq: Every day | ORAL | Status: DC

## 2013-06-14 MED ORDER — FUROSEMIDE 20 MG PO TABS: 20.0000 mg | ORAL_TABLET | Freq: Every day | ORAL | Status: DC | PRN

## 2013-06-14 NOTE — Progress Notes (Signed)
Inpatient Diabetes Program Recommendations  AACE/ADA: New Consensus Statement on Inpatient Glycemic Control (2013)  Target Ranges:  Prepandial:   less than 140 mg/dL      Peak postprandial:   less than 180 mg/dL (1-2 hours)      Critically ill patients:  140 - 180 mg/dL   Reason for Visit:  Results for Anita Gardner, Anita Gardner (MRN [id number]) as of 06/14/2013 09:41  Ref. Range 06/13/2013 08:12 06/13/2013 12:20 06/13/2013 16:42 06/13/2013 21:30 06/14/2013 08:07  Glucose-Capillary Latest Range: 70-99 mg/dL 782 (H) 956 (H) 213 (H) 233 (H) 192 (H)   Consider adding Levemir 10 units at HS while oral medications on hold. Will follow.

## 2013-06-14 NOTE — Progress Notes (Signed)
Reviewed discharge instructions with patient. Patient verbally understood and signed the form. Iv removed and will be escorted out via wheelchair.  Charae Depaolis, Charlaine Dalton RN

## 2013-06-14 NOTE — Discharge Summary (Signed)
Name: Anita Gardner MRN: [id number] DOB: 12/13/1955 57 y.o. PCP: Windell Hummingbird, MD  Date of Admission: 06/12/2013 12:01 PM Date of Discharge: 06/14/2013 Attending Physician: Rocco Serene, MD  Discharge Diagnosis: 1. Atrial fibrillation, new onset- pt discharged on diltiazem 120 mg daily, warfarin 5 mg daily 2. Bilateral leg edema- pt discharged on Lasix 20mg  daily prn 3. 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 Arbutus Ped next week 4. DM2- A1C 9.9 in 5/14; only on oral agents metformin and glyburide at home 5. Urge incontinence- continued oxybutinin   Discharge Medications:   Medication List         acetaminophen 650 MG CR tablet  Commonly known as:  TYLENOL  Take 1,300 mg by mouth every 8 (eight) hours as needed for pain.     diltiazem 120 MG 24 hr capsule  Commonly known as:  CARDIZEM CD  Take 1 capsule (120 mg total) by mouth daily.     Fish Oil 1200 MG Caps  Take 1,200 mg by mouth daily.     furosemide 20 MG tablet  Commonly known as:  LASIX  Take 1 tablet (20 mg total) by mouth daily as needed.     glyBURIDE 5 MG tablet  Commonly known as:  DIABETA  Take 5 mg by mouth 2 (two) times daily with a meal.     loratadine 10 MG tablet  Commonly known as:  CLARITIN  Take 10 mg by mouth daily as needed for allergies.     metFORMIN 1000 MG tablet  Commonly known as:  GLUCOPHAGE  Take 1,000 mg by mouth 2 (two) times daily with a meal.     multivitamin with minerals Tabs  Take 1 tablet by mouth daily.     oxybutynin 5 MG tablet  Commonly known as:  DITROPAN  Take 5 mg by mouth 3 (three) times daily.     prochlorperazine 10 MG tablet  Commonly known as:  COMPAZINE  Take 10 mg by mouth every 6 (six) hours as needed (for nausea).     ranitidine 150 MG tablet  Commonly known as:  ZANTAC  Take 150 mg by mouth 2 (two)  times daily.     Vitamin D-3 5000 UNITS Tabs  Take 5,000 Units by mouth daily.     warfarin 5 MG tablet  Commonly known as:  COUMADIN  Take 1 tablet (5 mg total) by mouth daily.        Disposition and follow-up:   Anita Gardner was discharged from [facility] in Stable condition.  At the hospital follow up visit please address:  1. Compliance with diltiazem, warfarin (pt had concerns about taking warfarin given friends at church who have had bruising, etc.)  2. Status of bilateral leg edema  3. Address diabetes medication regimen   4.  Labs / imaging needed at time of follow-up: EKG, INR, A1C  5.  Pending labs/ test needing follow-up: none  Follow-up Appointments:     Follow-up Information   Follow up with Annett Gula, MD On 06/18/2013. (9:45a)    Contact information:   [address] [place] [address] [phone]       Discharge Instructions: Discharge Orders   Future Appointments Provider Department Dept Phone   06/18/2013 9:45 AM Annett Gula, MD [facility] [phone]   06/18/2013 10:30  AM [facility] [phone]   06/19/2013 1:30 PM Dava Najjar [facility] [phone]   06/19/2013 2:30 PM [facility] [phone]   Patient to arrive 15 minutes prior to appointment time.   06/20/2013 1:30 PM Si Gaul, MD [facility] MEDICAL ONCOLOGY [phone]   06/22/2013 3:20 PM Lonie Peak, MD [facility] RADIATION ONCOLOGY [phone]   06/25/2013 1:00 PM [facility] [phone]   Future Orders Complete By Expires     Call MD for:  persistant dizziness or light-headedness  As directed     Diet - low sodium heart healthy  As directed     Increase activity slowly  As directed        Procedures Performed:  Ct  Angio Chest Pe W/cm &/or Wo Cm  06/12/2013   *RADIOLOGY REPORT*  Clinical Data: Squamous cell lung cancer with nodal metastases. Bilateral leg swelling.  New onset of atrial fibrillation.  CT ANGIOGRAPHY CHEST  Technique:  Multidetector CT imaging of the chest using the standard protocol during bolus administration of intravenous contrast. Multiplanar reconstructed images including MIPs were obtained and reviewed to evaluate the vascular anatomy.  Contrast: OMNIPAQUE IOHEXOL 350 MG/ML SOLN  Comparison: CT of the thorax 04/09/2013.  Findings:  Mediastinum: Study is slightly limited by patient respiratory motion.  With these limitations in mind, there is no evidence to suggest clinically relevant central, lobar or segmental sized filling defect.  Smaller subsegmental sized filling defects cannot be entirely excluded. Heart size is mildly enlarged. Small amount of pericardial fluid and/or thickening anteriorly, unlikely to be of any hemodynamic significance at this time.  No associated pericardial calcification. There is atherosclerosis of the thoracic aorta, the great vessels of the mediastinum and the coronary arteries, including calcified atherosclerotic plaque in the left anterior descending, left circumflex and right coronary arteries. Mild calcifications of the mitral valve.  Prominent lymphoid tissue in the left hilar region appears slightly larger than prior examinations, with the largest node or nodal conglomerate measuring up to 1.9 x 1.7 cm on image 42 of series 4.  No definite pathologically enlarged mediastinal or right hilar lymph nodes are noted on today's examination.  Mild circumferential thickening of the distal two thirds of the esophagus may suggest esophagitis.  Lungs/Pleura: A small pleural-based nodule is again noted in the periphery of the left lower lobe measuring 1.2 x 0.6 cm on today's examination (image 59 of series 6), which is similar in size to the prior study when allowing for  slight differences in measurement technique.  Compared to the prior examination there is now extensive thickening of the bronchial walls and mild thickening of the peribronchovascular interstitium adjacent to the left lower lobe bronchi which appear narrowed on today's study.  Several of these bronchi appear to be filled with fluid, likely retained secretions.  There is septal thickening throughout the inferior aspect of the left lower lobe, suggesting some regional edema.  The left lower lobe also appears slightly hyperlucent, which may be a reflection of some air trapping related to the previously discussed bronchial findings.  Mild diffuse bronchial wall thickening is noted in other regions of the lungs bilaterally as well.  No other definite new suspicious appearing pulmonary nodules or masses are identified.  There is a small left pleural effusion layering dependently which is simple in appearance.  Upper Abdomen: Unremarkable.  Musculoskeletal: There are no aggressive appearing lytic or blastic lesions noted in the visualized portions of the skeleton.  IMPRESSION: 1.  Despite the limitations of today's examination there is no evidence to suggest clinically relevant central, lobar or segmental sized pulmonary embolism. 2.  Left lower lobe pulmonary nodule is similar in size to the prior study, but is now associated with surrounding septal thickening suggestive of some regional edema. There is also a new small left pleural effusion which is simple in appearance. In addition, there is increasing bronchial wall thickening and mild thickening of the peribronchovascular interstitium throughout the left lower lobe resulting in significant air trapping.  These findings may be related to prior radiation therapy. 2.  Left hilar adenopathy appears increased compared to the prior study.  Whether not this is neoplastic or simply reactive to the process in the left lower lobe is uncertain, but attention on follow-up studies  is strongly recommended. 4.  Circumferential thickening of the distal two thirds of the esophagus suggestive of esophagitis, which may be treatment related. 5.  Cardiomegaly with a small amount of pericardial fluid and/or thickening, which is unlikely to be of hemodynamic significance at this time. 6. Atherosclerosis, including three-vessel coronary artery disease. Please note that although the presence of coronary artery calcium documents the presence of coronary artery disease, the severity of this disease and any potential stenosis cannot be assessed on this non-gated CT examination.  Assessment for potential risk factor modification, dietary therapy or pharmacologic therapy may be warranted, if clinically indicated. 7.  Additional incidental findings, as above.   Original Report Authenticated By: Trudie Reed, M.D.    2D Echo:  Study Conclusions - Left ventricle: The cavity size was normal. Wall thickness was increased in a pattern of mild LVH. Systolic function was normal. The estimated ejection fraction was in the range of 55% to 60%. Wall motion was normal; there were no regional wall motion abnormalities. - Right ventricle: The cavity size was mildly dilated. Wall thickness was normal. - Right atrium: The atrium was mildly dilated.   Admission HPI:  The patient presents to the clinic today for some new leg edema. She notes that it started about 1 week ago and is waxing and waning. She has had some swelling in the past with some chemotherapy however not this bad. She is having some pain in her calves on the backs. She has PMH of small cell carcinoma LLL with nodal metastases (mediastinal and left infrahilar) and low mediastinum or left pericardium hypermetabolic activity. She is currently undergoing chemotherapy and finished radiation 4/14. She received her last dose of chemotherapy on 05/21/13 and is currently on carboplatin and etoposide. She also has PMH of diabetes mellitus type II,  GERD, HTN, COPD (currently ex-smoker). She has not tried anything for the swelling. She noticed that the only new medicine is her oxybutynin for urinary frequency. She was seen in the clinic today and noted to have some dyspnea on exertion and new onset atrial fibrillation. She states that she has never had any heart problems or heart attacks and has not been told that she has a funny or different heart rhythm. She has not noticed any palpitations or racing of her heart recently. She has been taking her medications regularly. She denies any fevers or chills. She denies any dysuria or frequency. She states that she has some lab work that needs to be done tomorrow (her oncologist is Dr. Shirline Frees). She does not have any recent travel. She states that  she is able to walk about 100 feet without getting too winded but has noted some decreased energy and stamina since the cancer treatment started. She has occasional nausea but no vomiting. She has noticed that foods affect her bowels more than before cancer treatment and had several episodes of diarrhea yesterday after eating raw tomato but no episodes today. She has not eaten breakfast yet today since she was having her appointment. She denies headache or congestion. She has a chronic cough which is unchanged and not productive. She is not having chest pain and denies SOB. She has no new rashes or skin breakdown. She denies abdominal pain. She denies any numbness, new weakness, change in speech. She denies sweats or arm tingling.    Hospital Course by problem list: 1. Atrial fibrillation- New onset atrial fibrillation (without RVR) discovered in clinic. Pt received IV diltiazem 10 mg bolus at admission then converted to immediate release diltiazem 30 mg q6h. Pt had CTA on 7/22 given new leg swelling and current cancer with Well's score of 5.5 (moderate risk), negative for PE. Also concern for ACS given diabetes could cause silent MI leading to new atrial fibrillation.  Troponin x 3 negative. TSH within normal limits. Bilateral lower extremity dopplers negative for DVT or superficial thrombosis. Pt was on heparin drip for anticoagulation but d/c'ed after negative dopplers on 7/23. Echo showed mild LVH, normal systolic function with EF 55% to 60%; no wall motion abnormalities.  Pt was discharged on diltiazem XR 120 mg daily at discharge and warfarin 5 mg daily for lifetime CVA prophylaxis.  2. Bilateral leg edema - Concern for DVT given active malignancy, but CTA and bilateral lower extremity dopplers negative per above. She is not on any new medications that could explain the swelling. Pt did not notice significant improvement with Ted hose. Started pt on Lasix 20 mg daily prn for swelling. 3. DM 2- Last A1C 9.9 on 04/05/13. Pt was on sensitive SSI, discharged back on home meds of metformin and glyburide.  4. Small cell lung carcinoma (limited)- Stage IIIa on presentation. Pt has undergone radiation (finished in 4/14) and is currently on chemotherapy (carboplatin, etoposide, neulasta s/p 5 cycles). Some question of pericardium versus mediastinal hypermetabolic activity on last PET scan but no pericardial disease on CTA ("small amount of pericardial thickening anteriorly unlikely to be of hemodynamic significance") or echo (no pericardial effusion) done this admission. Active cancer status, she sees Dr. Arbutus Ped as out-patient.  5. Urge incontinence- Pt recently prescribed oxybutynin 5 mg TID. UA on 7/22 negative.  Continued this medication while inpatient and discharged on same dose.   Discharge Vitals:   BP 98/83  Pulse 69  Temp(Src) 98.2 F (36.8 C) (Oral)  Resp 17  Ht 5' 4.96" (1.65 m)  Wt 193 lb 2 oz (87.6 kg)  BMI 32.18 kg/m2  SpO2 94%  Discharge Labs:  Results for orders placed during the hospital encounter of 06/12/13 (from the past 24 hour(s))  HEPARIN LEVEL (UNFRACTIONATED)     Status: Abnormal   Collection Time    06/13/13  2:30 PM      Result Value  Range   Heparin Unfractionated 0.24 (*) 0.30 - 0.70 IU/mL  GLUCOSE, CAPILLARY     Status: Abnormal   Collection Time    06/13/13  4:42 PM      Result Value Range   Glucose-Capillary 158 (*) 70 - 99 mg/dL  GLUCOSE, CAPILLARY     Status: Abnormal   Collection Time  06/13/13  9:30 PM      Result Value Range   Glucose-Capillary 233 (*) 70 - 99 mg/dL  GLUCOSE, CAPILLARY     Status: Abnormal   Collection Time    06/14/13  8:07 AM      Result Value Range   Glucose-Capillary 192 (*) 70 - 99 mg/dL    Signed: Rocco Serene, MD 06/14/2013, 1:08 PM   Time Spent on Discharge: 40 minutes Services Ordered on Discharge: none Equipment Ordered on Discharge: none

## 2013-06-14 NOTE — Progress Notes (Signed)
Internal Medicine Attending  Date: 06/14/2013  Patient name: Anita Gardner Medical record number: [id number] Date of birth: [birth year] Age: 56 y.o. Gender: female  I saw and evaluated the patient. I reviewed the resident's note by Dr. Aundria Rud and I agree with the resident's findings and plans as documented in her progress note.  Work-up for the cause of atrial fibrillation has been unremarkable.  Rate has been controlled with diltiazem.  I agree with discharge home today on coumadin for CVA prophylaxis and diltiazem for rate control.  Follow-up will be in the [facility].

## 2013-06-14 NOTE — Progress Notes (Signed)
Subjective: Anita Gardner is doing well this morning, still has some bilateral leg swelling.  No chest pain or palpitations. HR has been 80s-90s and systolic BP high 16X-096E overnight, now 115.  She is ready to go home today.    Objective: Vital signs in last 24 hours: Filed Vitals:   06/14/13 0500 06/14/13 0600 06/14/13 0700 06/14/13 0803  BP:    115/72  Pulse:    81  Temp:    97.1 F (36.2 C)  TempSrc:    Oral  Resp: 18 20 17    Height:      Weight:      SpO2:    97%   Weight change:   Intake/Output Summary (Last 24 hours) at 06/14/13 0810 Last data filed at 06/14/13 0400  Gross per 24 hour  Intake 1822.77 ml  Output   3400 ml  Net -1577.23 ml   PEX General: alert, cooperative, and in no apparent distress HEENT: vision grossly intact, oropharynx clear and non-erythematous  Neck: supple, no lymphadenopathy, JVD, or carotid bruits Lungs: clear to ascultation bilaterally, normal work of respiration, no wheezes, rales, ronchi Heart: irregularly irregular rate, no murmurs, gallops, or rubs Abdomen: soft, non-tender, non-distended, normal bowel sounds Extremities: 2+ pitting edema bilateral lower extremities to knees, 2+ DP/PT pulses bilaterally Neurologic: alert & oriented X3, cranial nerves II-XII intact, strength grossly intact, sensation intact to light touch  Lab Results: Basic Metabolic Panel:  Recent Labs Lab 06/12/13 1711 06/13/13 0407  NA 133* 136  K 3.5 3.6  CL 97 98  CO2 25 26  GLUCOSE 94 160*  BUN 7 7  CREATININE 0.47* 0.55  CALCIUM 9.4 9.4   Liver Function Tests:  Recent Labs Lab 06/12/13 1711  AST 18  ALT 25  ALKPHOS 93  BILITOT 0.5  PROT 6.9  ALBUMIN 3.8   CBC:  Recent Labs Lab 06/12/13 1711 06/13/13 0407  WBC 6.1 5.8  HGB 10.3* 9.2*  HCT 30.7* 27.9*  MCV 104.4* 105.7*  PLT 179 211   Cardiac Enzymes:  Recent Labs Lab 06/12/13 1709 06/12/13 1959 06/12/13 2357  TROPONINI <0.30 <0.30 <0.30   CBG:  Recent Labs Lab  06/12/13 1727 06/12/13 2153 06/13/13 0812 06/13/13 1220 06/13/13 1642 06/13/13 2130  GLUCAP 95 165* 153* 178* 158* 233*   Thyroid Function Tests:  Recent Labs Lab 06/12/13 1711  TSH 0.868   Coagulation:  Recent Labs Lab 06/13/13 0407  LABPROT 13.7  INR 1.07  Urinalysis:  Recent Labs Lab 06/12/13 1404  COLORURINE YELLOW  LABSPEC 1.020  PHURINE 6.0  GLUCOSEU NEGATIVE  HGBUR NEGATIVE  BILIRUBINUR NEGATIVE  KETONESUR NEGATIVE  PROTEINUR NEGATIVE  UROBILINOGEN 0.2  NITRITE NEGATIVE  LEUKOCYTESUR NEGATIVE   Micro Results: Recent Results (from the past 240 hour(s))  MRSA PCR SCREENING     Status: None   Collection Time    06/12/13 12:12 PM      Result Value Range Status   MRSA by PCR NEGATIVE  NEGATIVE Final   Comment:            The GeneXpert MRSA Assay (FDA     approved for NASAL specimens     only), is one component of a     comprehensive MRSA colonization     surveillance program. It is not     intended to diagnose MRSA     infection nor to guide or     monitor treatment for     MRSA infections.   Studies/Results: Ct Angio  Chest Pe W/cm &/or Wo Cm  06/12/2013   *RADIOLOGY REPORT*  Clinical Data: Squamous cell lung cancer with nodal metastases. Bilateral leg swelling.  New onset of atrial fibrillation.  CT ANGIOGRAPHY CHEST  Technique:  Multidetector CT imaging of the chest using the standard protocol during bolus administration of intravenous contrast. Multiplanar reconstructed images including MIPs were obtained and reviewed to evaluate the vascular anatomy.  Contrast: OMNIPAQUE IOHEXOL 350 MG/ML SOLN  Comparison: CT of the thorax 04/09/2013.  Findings:  Mediastinum: Study is slightly limited by patient respiratory motion.  With these limitations in mind, there is no evidence to suggest clinically relevant central, lobar or segmental sized filling defect.  Smaller subsegmental sized filling defects cannot be entirely excluded. Heart size is mildly  enlarged. Small amount of pericardial fluid and/or thickening anteriorly, unlikely to be of any hemodynamic significance at this time.  No associated pericardial calcification. There is atherosclerosis of the thoracic aorta, the great vessels of the mediastinum and the coronary arteries, including calcified atherosclerotic plaque in the left anterior descending, left circumflex and right coronary arteries. Mild calcifications of the mitral valve.  Prominent lymphoid tissue in the left hilar region appears slightly larger than prior examinations, with the largest node or nodal conglomerate measuring up to 1.9 x 1.7 cm on image 42 of series 4.  No definite pathologically enlarged mediastinal or right hilar lymph nodes are noted on today's examination.  Mild circumferential thickening of the distal two thirds of the esophagus may suggest esophagitis.  Lungs/Pleura: A small pleural-based nodule is again noted in the periphery of the left lower lobe measuring 1.2 x 0.6 cm on today's examination (image 59 of series 6), which is similar in size to the prior study when allowing for slight differences in measurement technique.  Compared to the prior examination there is now extensive thickening of the bronchial walls and mild thickening of the peribronchovascular interstitium adjacent to the left lower lobe bronchi which appear narrowed on today's study.  Several of these bronchi appear to be filled with fluid, likely retained secretions.  There is septal thickening throughout the inferior aspect of the left lower lobe, suggesting some regional edema.  The left lower lobe also appears slightly hyperlucent, which may be a reflection of some air trapping related to the previously discussed bronchial findings.  Mild diffuse bronchial wall thickening is noted in other regions of the lungs bilaterally as well.  No other definite new suspicious appearing pulmonary nodules or masses are identified.  There is a small left pleural  effusion layering dependently which is simple in appearance.  Upper Abdomen: Unremarkable.  Musculoskeletal: There are no aggressive appearing lytic or blastic lesions noted in the visualized portions of the skeleton.  IMPRESSION: 1.  Despite the limitations of today's examination there is no evidence to suggest clinically relevant central, lobar or segmental sized pulmonary embolism. 2.  Left lower lobe pulmonary nodule is similar in size to the prior study, but is now associated with surrounding septal thickening suggestive of some regional edema. There is also a new small left pleural effusion which is simple in appearance. In addition, there is increasing bronchial wall thickening and mild thickening of the peribronchovascular interstitium throughout the left lower lobe resulting in significant air trapping.  These findings may be related to prior radiation therapy. 2.  Left hilar adenopathy appears increased compared to the prior study.  Whether not this is neoplastic or simply reactive to the process in the left lower lobe is uncertain,  but attention on follow-up studies is strongly recommended. 4.  Circumferential thickening of the distal two thirds of the esophagus suggestive of esophagitis, which may be treatment related. 5.  Cardiomegaly with a small amount of pericardial fluid and/or thickening, which is unlikely to be of hemodynamic significance at this time. 6. Atherosclerosis, including three-vessel coronary artery disease. Please note that although the presence of coronary artery calcium documents the presence of coronary artery disease, the severity of this disease and any potential stenosis cannot be assessed on this non-gated CT examination.  Assessment for potential risk factor modification, dietary therapy or pharmacologic therapy may be warranted, if clinically indicated. 7.  Additional incidental findings, as above.   Original Report Authenticated By: Trudie Reed, M.D.   Medications: I  have reviewed the patient's current medications. Scheduled Meds: . cholecalciferol  5,000 Units Oral Daily  . diltiazem  30 mg Oral Q6H  . famotidine  20 mg Oral BID  . heparin subcutaneous  5,000 Units Subcutaneous Q8H  . insulin aspart  0-9 Units Subcutaneous TID WC  . multivitamin with minerals  1 tablet Oral Daily  . oxybutynin  5 mg Oral TID  . sodium chloride  3 mL Intravenous Q12H  . sodium chloride  3 mL Intravenous Q12H  . sucralfate  1 g Oral QID   Continuous Infusions:   PRN Meds:.sodium chloride, acetaminophen, acetaminophen, prochlorperazine, sodium chloride Assessment/Plan:  #Atrial fibrillation- Concern for new onset atrial fibrillation (without RVR). Pt received IV diltiazem 10 mg bolus at admission, immediate release diltiazem 30 mg q6hr since. Pt had CTA on 7/22 given new leg swelling and current cancer with Well's score of 5.5 (moderate risk), negative for PE.  Bilateral lower extremity dopplers negative for DVT or superficial thrombosis.  Pt was on heparin drip for anticoagulation but d/c'ed after negative dopplers yesterday.  Echo showed mild LVH, normal systolic function with EF 55% to 60%; no wall motion abnormalities.  -convert diltiazem to extended release 120 mg daily today at discharge -will start warfarin for long term anti-coagulation  #Bilateral leg edema - Concern for DVT given active malignancy but CTA and bilateral lower extremity dopplers negative per above.  She is not on any new medications that could explain the swelling. Pt did not notice significant improvement with Ted hose.  -will start Lasix 20 mg daily prn for swelling  #DM 2- Last A1C 9.9 on 04/05/13.  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 Has undergone radiation (finished in 4/14) and chemotherapy (carboplatin, etoposide, neulasta). Some question on pericardium versus mediastinal hypermetabolic activity on last PET scan. Active  cancer status, she sees Dr. Arbutus Ped as out-patient.  -will make Dr. Arbutus Ped aware of pt's inpatient stay and CTA/labs done this admission  #?Hypertension- Pt not taking any BP meds as outpt.   #Urge incontinence- Pt uses oxybutynin TID 5 mg at home. UA negative. -continue oxybutinin  #DVT ppx - lovenox   Dispo: Disposition today.   The patient does have a current PCP Anita Hummingbird, MD) and does need an [facility] hospital follow-up appointment after discharge.  The patient does not have transportation limitations that hinder transportation to clinic appointments.  .Services Needed at time of discharge: Y = Yes, Blank = No PT:   OT:   RN:   Equipment:   Other:     LOS: 2 days   Rocco Serene, MD 06/14/2013, 8:10 AM

## 2013-06-17 NOTE — Discharge Summary (Signed)
Agree with Dr. Baker Pierini Discharge Summary except that she has limited stage small cell lung cancer.  TNM staging is not used in small cell lung cancer.

## 2013-06-18 ENCOUNTER — Other Ambulatory Visit: Payer: No Typology Code available for payment source

## 2013-06-18 ENCOUNTER — Ambulatory Visit (INDEPENDENT_AMBULATORY_CARE_PROVIDER_SITE_OTHER): Payer: No Typology Code available for payment source | Admitting: Pharmacist

## 2013-06-18 ENCOUNTER — Ambulatory Visit (HOSPITAL_COMMUNITY)
Admission: RE | Admit: 2013-06-18 | Discharge: 2013-06-18 | Disposition: A | Discharge status: Home / Self Care | Payer: Medicaid Other | Source: Ambulatory Visit | Attending: Internal Medicine | Admitting: Internal Medicine

## 2013-06-18 ENCOUNTER — Ambulatory Visit (INDEPENDENT_AMBULATORY_CARE_PROVIDER_SITE_OTHER): Payer: No Typology Code available for payment source | Admitting: Internal Medicine

## 2013-06-18 ENCOUNTER — Encounter: Payer: Self-pay | Admitting: Internal Medicine

## 2013-06-18 VITALS — BP 112/75 | HR 100 | Temp 97.1°F | Ht 65.5 in | Wt 194.7 lb

## 2013-06-18 DIAGNOSIS — I4891 Unspecified atrial fibrillation: Secondary | ICD-10-CM | POA: Insufficient documentation

## 2013-06-18 DIAGNOSIS — R600 Localized edema: Secondary | ICD-10-CM

## 2013-06-18 DIAGNOSIS — C3490 Malignant neoplasm of unspecified part of unspecified bronchus or lung: Secondary | ICD-10-CM

## 2013-06-18 DIAGNOSIS — I10 Essential (primary) hypertension: Secondary | ICD-10-CM

## 2013-06-18 DIAGNOSIS — R9431 Abnormal electrocardiogram [ECG] [EKG]: Secondary | ICD-10-CM | POA: Insufficient documentation

## 2013-06-18 DIAGNOSIS — Z0000 Encounter for general adult medical examination without abnormal findings: Secondary | ICD-10-CM

## 2013-06-18 DIAGNOSIS — Z7901 Long term (current) use of anticoagulants: Secondary | ICD-10-CM

## 2013-06-18 DIAGNOSIS — G4700 Insomnia, unspecified: Secondary | ICD-10-CM

## 2013-06-18 DIAGNOSIS — C3492 Malignant neoplasm of unspecified part of left bronchus or lung: Secondary | ICD-10-CM

## 2013-06-18 DIAGNOSIS — R609 Edema, unspecified: Secondary | ICD-10-CM

## 2013-06-18 LAB — BASIC METABOLIC PANEL WITH GFR
BUN: 10 mg/dL (ref 6–23)
Chloride: 98 mEq/L (ref 96–112)
Creat: 0.52 mg/dL (ref 0.50–1.10)
GFR, Est Non African American: >89 mL/min

## 2013-06-18 MED ORDER — RIVAROXABAN 20 MG PO TABS: 20.0000 mg | ORAL_TABLET | Freq: Every day | ORAL | Status: DC

## 2013-06-18 NOTE — Assessment & Plan Note (Addendum)
Following with oncology Dr. Shirline Frees appt 06/20/13. Please review prognosis and goal of care with the patient Will cc this note to ask which should use in Atrial fibrillation and history of malignancy Coumadin vs Xarelto vs other.  Also asking for cardiology input Consulted social work and palliative care to review goals of care with the patient with this diagnosis.

## 2013-06-18 NOTE — Assessment & Plan Note (Signed)
BP Readings from Last 3 Encounters:  06/18/13 112/75  06/14/13 98/83  06/12/13 104/67    Lab Results  Component Value Date   NA 136 06/18/2013   K 4.2 06/18/2013   CREATININE 0.52 06/18/2013    Assessment: Blood pressure control: controlled Progress toward BP goal:  at goal Comments: none  Plan: Medications:  continue current medications Educational resources provided: brochure;handout;video Self management tools provided: other (see comments) Other plans: check BMET today

## 2013-06-18 NOTE — Assessment & Plan Note (Signed)
Encouraged patient to try sleep hygiene and Rx for Restoril 30 mg qhs given by Dr. Shirline Frees.

## 2013-06-18 NOTE — Progress Notes (Signed)
  Subjective:    [name], female    DOB: 1956/08/03, 57 y.o.   MRN: [id number]  HPI Comments: 57 y.o PMH CAD, cardiomegaly, atrial fibrillation now on Coumadin (taking 5 mg since Friday prior to visit and echo EF 55-60% with mild LVH, INR 1.07 today), b/l leg edema, chronic pain (knee), DM 2 associated with neuropathy (HA1c 9.9 04/05/2013 cbg 162 today), HLD (110 10/2012), HTN (BP 112/75 today), h/o small cell lung cancer stage 3a (follows with oncology Dr. Gwenyth Bouillon s/p radiation on chemotherapy), tobacco abuse (still smoking 1 cigarette or less daily).  She follows with oncology Dr. Gwenyth Bouillon. BP 112/75, HR 100, Afebrile, sat 97%.   She c/o leg edema being a little bit better.  She has associated 5/10 throbbing/aching/soreness leg pain on the front part of her legs, denies calf pain.    Her mother mentions that she has not been able to sleep x 1 week.  Patient denies h/o mental illness though bipolar is listed as her medical problem.  The patient is not taking Restoril 30 mg qhs Rx to her by Dr. Gwenyth Bouillon in 4 or 03/2013 which was given for slepp.    SH: smoking 1 cigarette or less, lives alone and sometimes stays with her mom, has 2 kids age 28 and 83 boy and girl.    Health maintenance: She is due for mammogram which we scheduled today and due for pap smear will follow up in the future.       Review of Systems  Respiratory: Positive for shortness of breath.        Sob with exertion though stable since in the hospital  Cardiovascular: Positive for leg swelling. Negative for chest pain.  Gastrointestinal: Negative for abdominal pain and blood in stool.  Genitourinary: Negative for hematuria.       Objective:   Physical Exam  Nursing note and vitals reviewed. Constitutional: She is oriented to person, place, and time. Vital signs are normal. She appears well-developed and well-nourished. She is cooperative. No distress.  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  Mouth/Throat: Oropharynx is clear and moist and mucous membranes are normal. No oropharyngeal exudate.  Alopecia 2/2 chemo  Eyes: Conjunctivae are normal. Pupils are equal, round, and reactive to light. Right eye exhibits no discharge. Left eye exhibits no discharge. No scleral icterus.  Cardiovascular: S1 normal, S2 normal and normal heart sounds.  An irregularly irregular rhythm present. Tachycardia present.   No murmur heard. Slightly tachycardic  1-2 + lower extremity edema   Pulmonary/Chest: Effort normal and breath sounds normal. No respiratory distress. She has no wheezes.  Abdominal: Soft. Bowel sounds are normal. There is no tenderness.  Obese ab  Neurological: She is alert and oriented to person, place, and time. Gait normal.  In wheelchair  Walks with cane  Grossly neurologically intact   Skin: Skin is warm, dry and intact. No rash noted. She is not diaphoretic.  Psychiatric: She has a normal mood and affect. Her speech is normal and behavior is normal. Judgment and thought content normal. Cognition and memory are normal.          Assessment & Plan:  F/u 06/25/13 check INR with Dr. Alexandria Lodge.  Otherwise follow up in 2 months.  Will schedule appt with cardiology for AF.  Already seeing Oncology 06/20/13

## 2013-06-18 NOTE — Assessment & Plan Note (Addendum)
Referred for mammogram today  Patient will need pap smear in 2 months  At some point in the future will need screening colonoscopy

## 2013-06-18 NOTE — Patient Instructions (Addendum)
General Instructions: Follow up 06/25/13 with Dr. Alexandria Lodge INR then 8/15 for [facility] check then in 2 months for atrial fibrillation and DM f/u TakeRestoril for sleep Take Coumadin 7.5 mg x 3 days then 5 mg for atrial fibrillation  Please stop smoking     Treatment Goals:  Goals (1 Years of Data) as of 06/18/13         As of Today 06/14/13 06/14/13 06/14/13 06/13/13     Blood Pressure    . Blood Pressure < 140/90  112/75 98/83 115/72 141/82 105/84     Result Component    . HEMOGLOBIN A1C < 7.0          . LDL CALC < 100            Progress Toward Treatment Goals:  Treatment Goal 06/18/2013  Hemoglobin A1C improved  Blood pressure at goal  Stop smoking smoking less    Self Care Goals & Plans:  Self Care Goal 06/18/2013  Manage my medications take my medicines as prescribed; bring my medications to every visit; refill my medications on time; follow the sick day instructions if I am sick  Monitor my health check my feet daily; keep track of my blood pressure  Eat healthy foods drink diet soda or water instead of juice or soda; eat more vegetables; eat foods that are low in salt; eat baked foods instead of fried foods; eat fruit for snacks and desserts; eat smaller portions  Be physically active find an activity I enjoy  Stop smoking -  Meeting treatment goals maintain the current self-care plan    Home Blood Glucose Monitoring 05/17/2013  Check my blood sugar 2 times a day  When to check my blood sugar before meals     Care Management & Community Referrals:  Referral 06/18/2013  Referrals made for care management support none needed  Referrals made to community resources none         Rivaroxaban oral tablets What is this medicine? RIVAROXABAN (ri va ROX a ban) is an anticoagulant (blood thinner). It is used to treat blood clots in the lungs or in the veins. It is also used after knee or hip surgeries to prevent blood clots. It is also used to lower the chance of stroke in  people with a medical condition called atrial fibrillation. This medicine may be used for other purposes; ask your health care provider or pharmacist if you have questions. What should I tell my health care provider before I take this medicine? They need to know if you have any of these conditions: -bleeding disorders -bleeding in the brain -blood in your stools (black or tarry stools) or if you have blood in your vomit -history of stomach bleeding -kidney disease -liver disease -low blood counts, like low white cell, platelet, or red cell counts -recent or planned spinal or epidural procedure -take medicines that treat or prevent blood clots -an unusual or allergic reaction to rivaroxaban, other medicines, foods, dyes, or preservatives -pregnant or trying to get pregnant -breast-feeding How should I use this medicine? Take this medicine by mouth with a glass of water. Follow the directions on the prescription label. Take your medicine at regular intervals. Do not take it more often than directed. Do not stop taking except on your doctor's advice. If you are taking this medicine after hip or knee replacement surgery, take it with or without food. If you are taking this medicine for atrial fibrillation, take it with your evening meal. If  you are taking this medicine to treat blood clots, take it with food at the same time each day. If you are unable to swallow your tablet, you may crush the tablet and mix it in applesauce. Then, immediately eat the applesauce. You should eat more food right after you eat the applesauce containing the crushed tablet. Talk to your pediatrician regarding the use of this medicine in children. Special care may be needed. Overdosage: If you think you've taken too much of this medicine contact a poison control center or emergency room at once. Overdosage: If you think you have taken too much of this medicine contact a poison control center or emergency room at  once. NOTE: This medicine is only for you. Do not share this medicine with others. What if I miss a dose? If you take your medicine once a day and miss a dose, take the missed dose as soon as you remember. If you take your medicine twice a day and miss a dose, take the missed dose immediately. In this instance, 2 tablets may be taken at the same time. The next day you should take 1 tablet twice a day as directed. What may interact with this medicine? -aspirin and aspirin-like medicines -certain antibiotics like erythromycin, azithromycin, and clarithromycin -certain medicines for fungal infections like ketoconazole and itraconazole -certain medicines for irregular heart beat like amiodarone, quinidine, dronedarone -certain medicines for seizures like carbamazepine, phenytoin -certain medicines that treat or prevent blood clots like warfarin, enoxaparin, and dalteparin  -conivaptan -diltiazem -felodipine -indinavir -lopinavir; ritonavir -NSAIDS, medicines for pain and inflammation, like ibuprofen or naproxen -ranolazine -rifampin -ritonavir -St. John's wort -verapamil This list may not describe all possible interactions. Give your health care provider a list of all the medicines, herbs, non-prescription drugs, or dietary supplements you use. Also tell them if you smoke, drink alcohol, or use illegal drugs. Some items may interact with your medicine. What should I watch for while using this medicine? Do not stop taking this medicine without first talking to your doctor. Stopping this medicine may increase your risk of having a stroke. Be sure to refill your prescription before you run out of medicine. This medicine may increase your risk to bruise or bleed. Call your doctor or health care professional if you notice any unusual bleeding. Be careful brushing and flossing your teeth or using a toothpick because you may bleed more easily. If you have any dental work done, tell your dentist you  are receiving this medicine. What side effects may I notice from receiving this medicine? Side effects that you should report to your doctor or health care professional as soon as possible: -allergic reactions like skin rash, itching or hives, swelling of the face, lips, or tongue -bloody or black, tarry stools -changes in vision -confusion, trouble speaking or understanding -red or dark-brown urine -redness, blistering, peeling or loosening of the skin, including inside the mouth -severe headaches -spitting up blood or brown material that looks like coffee grounds -sudden numbness or weakness of the face, arm or leg -trouble walking, dizziness, loss of balance or coordination -unusual bruising or bleeding from the eye, gums, or nose  Side effects that usually do not require medical attention (Report these to your doctor or health care professional if they continue or are bothersome.): -dizziness -muscle pain This list may not describe all possible side effects. Call your doctor for medical advice about side effects. You may report side effects to FDA at 1-800-FDA-1088. Where should I keep my medicine?  Keep out of the reach of children. Store at room temperature between 15 and 30 degrees C (59 and 86 degrees F). Throw away any unused medicine after the expiration date. NOTE: This sheet is a summary. It may not cover all possible information. If you have questions about this medicine, talk to your doctor, pharmacist, or health care provider.  2013, [employer]. (01/26/2012 8:55:13 AM)    Edema Edema is an abnormal build-up of fluids in tissues. Because this is partly dependent on gravity (water flows to the lowest place), it is more common in the legs and thighs (lower extremities). It is also common in the looser tissues, like around the eyes. Painless swelling of the feet and ankles is common and increases as a person ages. It may affect both legs and may include the calves or  even thighs. When squeezed, the fluid may move out of the affected area and may leave a dent for a few moments. CAUSES   Prolonged standing or sitting in one place for extended periods of time. Movement helps pump tissue fluid into the veins, and absence of movement prevents this, resulting in edema.  Varicose veins. The valves in the veins do not work as well as they should. This causes fluid to leak into the tissues.  Fluid and salt overload.  Injury, burn, or surgery to the leg, ankle, or foot, may damage veins and allow fluid to leak out.  Sunburn damages vessels. Leaky vessels allow fluid to go out into the sunburned tissues.  Allergies (from insect bites or stings, medications or chemicals) cause swelling by allowing vessels to become leaky.  Protein in the blood helps keep fluid in your vessels. Low protein, as in malnutrition, allows fluid to leak out.  Hormonal changes, including pregnancy and menstruation, cause fluid retention. This fluid may leak out of vessels and cause edema.  Medications that cause fluid retention. Examples are sex hormones, blood pressure medications, steroid treatment, or anti-depressants.  Some illnesses cause edema, especially heart failure, kidney disease, or liver disease.  Surgery that cuts veins or lymph nodes, such as surgery done for the heart or for breast cancer, may result in edema. DIAGNOSIS  Your caregiver is usually easily able to determine what is causing your swelling (edema) by simply asking what is wrong (getting a history) and examining you (doing a physical). Sometimes x-rays, EKG (electrocardiogram or heart tracing), and blood work may be done to evaluate for underlying medical illness. TREATMENT  General treatment includes:  Leg elevation (or elevation of the affected body part).  Restriction of fluid intake.  Prevention of fluid overload.  Compression of the affected body part. Compression with elastic bandages or support  stockings squeezes the tissues, preventing fluid from entering and forcing it back into the blood vessels.  Diuretics (also called water pills or fluid pills) pull fluid out of your body in the form of increased urination. These are effective in reducing the swelling, but can have side effects and must be used only under your caregiver's supervision. Diuretics are appropriate only for some types of edema. The specific treatment can be directed at any underlying causes discovered. Heart, liver, or kidney disease should be treated appropriately. HOME CARE INSTRUCTIONS   Elevate the legs (or affected body part) above the level of the heart, while lying down.  Avoid sitting or standing still for prolonged periods of time.  Avoid putting anything directly under the knees when lying down, and do not wear constricting clothing or garters  on the upper legs.  Exercising the legs causes the fluid to work back into the veins and lymphatic channels. This may help the swelling go down.  The pressure applied by elastic bandages or support stockings can help reduce ankle swelling.  A low-salt diet may help reduce fluid retention and decrease the ankle swelling.  Take any medications exactly as prescribed. SEEK MEDICAL CARE IF:  Your edema is not responding to recommended treatments. SEEK IMMEDIATE MEDICAL CARE IF:   You develop shortness of breath or chest pain.  You cannot breathe when you lay down; or if, while lying down, you have to get up and go to the window to get your breath.  You are having increasing swelling without relief from treatment.  You develop a fever over 102 F (38.9 C).  You develop pain or redness in the areas that are swollen.  Tell your caregiver right away if you have gained 3 lb/1.4 kg in 1 day or 5 lb/2.3 kg in a week. MAKE SURE YOU:   Understand these instructions.  Will watch your condition.  Will get help right away if you are not doing well or get  worse. Document Released: 11/08/2005 Document Revised: 05/09/2012 Document Reviewed: 06/26/2008 [facility] Patient Information 2014 [place], [place].  Smoking Cessation Quitting smoking is important to your health and has many advantages. However, it is not always easy to quit since nicotine is a very addictive drug. Often times, people try 3 times or more before being able to quit. This document explains the best ways for you to prepare to quit smoking. Quitting takes hard work and a lot of effort, but you can do it. ADVANTAGES OF QUITTING SMOKING  You will live longer, feel better, and live better.  Your body will feel the impact of quitting smoking almost immediately.  Within 20 minutes, blood pressure decreases. Your pulse returns to its normal level.  After 8 hours, carbon monoxide levels in the blood return to normal. Your oxygen level increases.  After 24 hours, the chance of having a heart attack starts to decrease. Your breath, hair, and body stop smelling like smoke.  After 48 hours, damaged nerve endings begin to recover. Your sense of taste and smell improve.  After 72 hours, the body is virtually free of nicotine. Your bronchial tubes relax and breathing becomes easier.  After 2 to 12 weeks, lungs can hold more air. Exercise becomes easier and circulation improves.  The risk of having a heart attack, stroke, cancer, or lung disease is greatly reduced.  After 1 year, the risk of coronary heart disease is cut in half.  After 5 years, the risk of stroke falls to the same as a nonsmoker.  After 10 years, the risk of lung cancer is cut in half and the risk of other cancers decreases significantly.  After 15 years, the risk of coronary heart disease drops, usually to the level of a nonsmoker.  If you are pregnant, quitting smoking will improve your chances of having a healthy baby.  The people you live with, especially any children, will be healthier.  You will have extra  money to spend on things other than cigarettes. QUESTIONS TO THINK ABOUT BEFORE ATTEMPTING TO QUIT You may want to talk about your answers with your caregiver.  Why do you want to quit?  If you tried to quit in the past, what helped and what did not?  What will be the most difficult situations for you after you quit? How  will you plan to handle them?  Who can help you through the tough times? Your family? Friends? A caregiver?  What pleasures do you get from smoking? What ways can you still get pleasure if you quit? Here are some questions to ask your caregiver:  How can you help me to be successful at quitting?  What medicine do you think would be best for me and how should I take it?  What should I do if I need more help?  What is smoking withdrawal like? How can I get information on withdrawal? GET READY  Set a quit date.  Change your environment by getting rid of all cigarettes, ashtrays, matches, and lighters in your home, car, or work. Do not let people smoke in your home.  Review your past attempts to quit. Think about what worked and what did not. GET SUPPORT AND ENCOURAGEMENT You have a better chance of being successful if you have help. You can get support in many ways.  Tell your family, friends, and co-workers that you are going to quit and need their support. Ask them not to smoke around you.  Get individual, group, or telephone counseling and support. Programs are available at [employer] and health centers. Call your local health department for information about programs in your area.  Spiritual beliefs and practices may help some smokers quit.  Download a "quit meter" on your computer to keep track of quit statistics, such as how long you have gone without smoking, cigarettes not smoked, and money saved.  Get a self-help book about quitting smoking and staying off of tobacco. LEARN NEW SKILLS AND BEHAVIORS  Distract yourself from urges to smoke. Talk to  someone, go for a walk, or occupy your time with a task.  Change your normal routine. Take a different route to work. Drink tea instead of coffee. Eat breakfast in a different place.  Reduce your stress. Take a hot bath, exercise, or read a book.  Plan something enjoyable to do every day. Reward yourself for not smoking.  Explore interactive web-based programs that specialize in helping you quit. GET MEDICINE AND USE IT CORRECTLY Medicines can help you stop smoking and decrease the urge to smoke. Combining medicine with the above behavioral methods and support can greatly increase your chances of successfully quitting smoking.  Nicotine replacement therapy helps deliver nicotine to your body without the negative effects and risks of smoking. Nicotine replacement therapy includes nicotine gum, lozenges, inhalers, nasal sprays, and skin patches. Some may be available over-the-counter and others require a prescription.  Antidepressant medicine helps people abstain from smoking, but how this works is unknown. This medicine is available by prescription.  Nicotinic receptor partial agonist medicine simulates the effect of nicotine in your brain. This medicine is available by prescription. Ask your caregiver for advice about which medicines to use and how to use them based on your health history. Your caregiver will tell you what side effects to look out for if you choose to be on a medicine or therapy. Carefully read the information on the package. Do not use any other product containing nicotine while using a nicotine replacement product.  RELAPSE OR DIFFICULT SITUATIONS Most relapses occur within the first 3 months after quitting. Do not be discouraged if you start smoking again. Remember, most people try several times before finally quitting. You may have symptoms of withdrawal because your body is used to nicotine. You may crave cigarettes, be irritable, feel very hungry, cough often, get  headaches,  or have difficulty concentrating. The withdrawal symptoms are only temporary. They are strongest when you first quit, but they will go away within 10 14 days. To reduce the chances of relapse, try to:  Avoid drinking alcohol. Drinking lowers your chances of successfully quitting.  Reduce the amount of caffeine you consume. Once you quit smoking, the amount of caffeine in your body increases and can give you symptoms, such as a rapid heartbeat, sweating, and anxiety.  Avoid smokers because they can make you want to smoke.  Do not let weight gain distract you. Many smokers will gain weight when they quit, usually less than 10 pounds. Eat a healthy diet and stay active. You can always lose the weight gained after you quit.  Find ways to improve your mood other than smoking. FOR MORE INFORMATION  www.smokefree.gov  Document Released: 11/02/2001 Document Revised: 05/09/2012 Document Reviewed: 02/17/2012 [facility] Patient Information 2014 [place], [place].  Type 2 Diabetes Mellitus, Adult Type 2 diabetes mellitus, often simply referred to as type 2 diabetes, is a long-lasting (chronic) disease. In type 2 diabetes, the pancreas does not make enough insulin (a hormone), the cells are less responsive to the insulin that is made (insulin resistance), or both. Normally, insulin moves sugars from food into the tissue cells. The tissue cells use the sugars for energy. The lack of insulin or the lack of normal response to insulin causes excess sugars to build up in the blood instead of going into the tissue cells. As a result, high blood sugar (hyperglycemia) develops. The effect of high sugar (glucose) levels can cause many complications. Type 2 diabetes was also previously called adult-onset diabetes but it can occur at any age.  RISK FACTORS  A person is predisposed to developing type 2 diabetes if someone in the family has the disease and also has one or more of the following primary risk  factors:  Overweight.  An inactive lifestyle.  A history of consistently eating high-calorie foods. Maintaining a normal weight and regular physical activity can reduce the chance of developing type 2 diabetes. SYMPTOMS  A person with type 2 diabetes may not show symptoms initially. The symptoms of type 2 diabetes appear slowly. The symptoms include:  Increased thirst (polydipsia).  Increased urination (polyuria).  Increased urination during the night (nocturia).  Weight loss. This weight loss may be rapid.  Frequent, recurring infections.  Tiredness (fatigue).  Weakness.  Vision changes, such as blurred vision.  Fruity smell to your breath.  Abdominal pain.  Nausea or vomiting.  Cuts or bruises which are slow to heal.  Tingling or numbness in the hands or feet. DIAGNOSIS Type 2 diabetes is frequently not diagnosed until complications of diabetes are present. Type 2 diabetes is diagnosed when symptoms or complications are present and when blood glucose levels are increased. Your blood glucose level may be checked by one or more of the following blood tests:  A fasting blood glucose test. You will not be allowed to eat for at least 8 hours before a blood sample is taken.  A random blood glucose test. Your blood glucose is checked at any time of the day regardless of when you ate.  A hemoglobin A1c blood glucose test. A hemoglobin A1c test provides information about blood glucose control over the previous 3 months.  An oral glucose tolerance test (OGTT). Your blood glucose is measured after you have not eaten (fasted) for 2 hours and then after you drink a glucose-containing beverage. TREATMENT  You may need to take insulin or diabetes medicine daily to keep blood glucose levels in the desired range.  You will need to match insulin dosing with exercise and healthy food choices. The treatment goal is to maintain the before meal blood sugar (preprandial glucose) level  at 70 130 mg/dL. HOME CARE INSTRUCTIONS   Have your hemoglobin A1c level checked twice a year.  Perform daily blood glucose monitoring as directed by your caregiver.  Monitor urine ketones when you are ill and as directed by your caregiver.  Take your diabetes medicine or insulin as directed by your caregiver to maintain your blood glucose levels in the desired range.  Never run out of diabetes medicine or insulin. It is needed every day.  Adjust insulin based on your intake of carbohydrates. Carbohydrates can raise blood glucose levels but need to be included in your diet. Carbohydrates provide vitamins, minerals, and fiber which are an essential part of a healthy diet. Carbohydrates are found in fruits, vegetables, whole grains, dairy products, legumes, and foods containing added sugars.    Eat healthy foods. Alternate 3 meals with 3 snacks.  Lose weight if overweight.  Carry a medical alert card or wear your medical alert jewelry.  Carry a 15 gram carbohydrate snack with you at all times to treat low blood glucose (hypoglycemia). Some examples of 15 gram carbohydrate snacks include:  Glucose tablets, 3 or 4   Glucose gel, 15 gram tube  Raisins, 2 tablespoons (24 grams)  Jelly beans, 6  Animal crackers, 8  Regular pop, 4 ounces (120 mL)  Gummy treats, 9  Recognize hypoglycemia. Hypoglycemia occurs with blood glucose levels of 70 mg/dL and below. The risk for hypoglycemia increases when fasting or skipping meals, during or after intense exercise, and during sleep. Hypoglycemia symptoms can include:  Tremors or shakes.  Decreased ability to concentrate.  Sweating.  Increased heart rate.  Headache.  Dry mouth.  Hunger.  Irritability.  Anxiety.  Restless sleep.  Altered speech or coordination.  Confusion.  Treat hypoglycemia promptly. If you are alert and able to safely swallow, follow the 15:15 rule:  Take 15 20 grams of rapid-acting glucose or  carbohydrate. Rapid-acting options include glucose gel, glucose tablets, or 4 ounces (120 mL) of fruit juice, regular soda, or low fat milk.  Check your blood glucose level 15 minutes after taking the glucose.  Take 15 20 grams more of glucose if the repeat blood glucose level is still 70 mg/dL or below.  Eat a meal or snack within 1 hour once blood glucose levels return to normal.    Be alert to polyuria and polydipsia which are early signs of hyperglycemia. An early awareness of hyperglycemia allows for prompt treatment. Treat hyperglycemia as directed by your caregiver.  Engage in at least 150 minutes of moderate-intensity physical activity a week, spread over at least 3 days of the week or as directed by your caregiver. In addition, you should engage in resistance exercise at least 2 times a week or as directed by your caregiver.  Adjust your medicine and food intake as needed if you start a new exercise or sport.  Follow your sick day plan at any time you are unable to eat or drink as usual.  Avoid tobacco use.  Limit alcohol intake to no more than 1 drink per day for nonpregnant women and 2 drinks per day for men. You should drink alcohol only when you are also eating food. Talk with your caregiver whether alcohol  is safe for you. Tell your caregiver if you drink alcohol several times a week.  Follow up with your caregiver regularly.  Schedule an eye exam soon after the diagnosis of type 2 diabetes and then annually.  Perform daily skin and foot care. Examine your skin and feet daily for cuts, bruises, redness, nail problems, bleeding, blisters, or sores. A foot exam by a caregiver should be done annually.  Brush your teeth and gums at least twice a day and floss at least once a day. Follow up with your dentist regularly.  Share your diabetes management plan with your workplace or school.  Stay up-to-date with immunizations.  Learn to manage stress.  Obtain ongoing diabetes  education and support as needed.  Participate in, or seek rehabilitation as needed to maintain or improve independence and quality of life. Request a physical or occupational therapy referral if you are having foot or hand numbness or difficulties with grooming, dressing, eating, or physical activity. SEEK MEDICAL CARE IF:   You are unable to eat food or drink fluids for more than 6 hours.  You have nausea and vomiting for more than 6 hours.  Your blood glucose level is over 240 mg/dL.  There is a change in mental status.  You develop an additional serious illness.  You have diarrhea for more than 6 hours.  You have been sick or have had a fever for a couple of days and are not getting better.  You have pain during any physical activity.  SEEK IMMEDIATE MEDICAL CARE IF:  You have difficulty breathing.  You have moderate to large ketone levels. MAKE SURE YOU:  Understand these instructions.  Will watch your condition.  Will get help right away if you are not doing well or get worse. Document Released: 11/08/2005 Document Revised: 08/02/2012 Document Reviewed: 06/06/2012 [facility] Patient Information 2014 [place], [place].  DASH Diet The DASH diet stands for "Dietary Approaches to Stop Hypertension." It is a healthy eating plan that has been shown to reduce high blood pressure (hypertension) in as little as 14 days, while also possibly providing other significant health benefits. These other health benefits include reducing the risk of breast cancer after menopause and reducing the risk of type 2 diabetes, heart disease, colon cancer, and stroke. Health benefits also include weight loss and slowing kidney failure in patients with chronic kidney disease.  DIET GUIDELINES  Limit salt (sodium). Your diet should contain less than 1500 mg of sodium daily.  Limit refined or processed carbohydrates. Your diet should include mostly whole grains. Desserts and added sugars should be used  sparingly.  Include small amounts of heart-healthy fats. These types of fats include nuts, oils, and tub margarine. Limit saturated and trans fats. These fats have been shown to be harmful in the body. CHOOSING FOODS  The following food groups are based on a 2000 calorie diet. See your Registered Dietitian for individual calorie needs. Grains and Grain Products (6 to 8 servings daily)  Eat More Often: Whole-wheat bread, brown rice, whole-grain or wheat pasta, quinoa, popcorn without added fat or salt (air popped).  Eat Less Often: White bread, white pasta, white rice, cornbread. Vegetables (4 to 5 servings daily)  Eat More Often: Fresh, frozen, and canned vegetables. Vegetables may be raw, steamed, roasted, or grilled with a minimal amount of fat.  Eat Less Often/Avoid: Creamed or fried vegetables. Vegetables in a cheese sauce. Fruit (4 to 5 servings daily)  Eat More Often: All fresh, canned (in natural juice),  or frozen fruits. Dried fruits without added sugar. One hundred percent fruit juice ( cup [237 mL] daily).  Eat Less Often: Dried fruits with added sugar. Canned fruit in light or heavy syrup. [employer], Fish, and Poultry (2 servings or less daily. One serving is 3 to 4 oz [85-114 g]).  Eat More Often: Ninety percent or leaner ground beef, tenderloin, sirloin. Round cuts of beef, chicken breast, [place] breast. All fish. Grill, bake, or broil your meat. Nothing should be fried.  Eat Less Often/Avoid: Fatty cuts of meat, [place], or chicken leg, thigh, or wing. Fried cuts of meat or fish. Dairy (2 to 3 servings)  Eat More Often: Low-fat or fat-free milk, low-fat plain or light yogurt, reduced-fat or part-skim cheese.  Eat Less Often/Avoid: Milk (whole, 2%).Whole milk yogurt. Full-fat cheeses. Nuts, Seeds, and Legumes (4 to 5 servings per week)  Eat More Often: All without added salt.  Eat Less Often/Avoid: Salted nuts and seeds, canned beans with added salt. Fats and Sweets  (limited)  Eat More Often: Vegetable oils, tub margarines without trans fats, sugar-free gelatin. Mayonnaise and salad dressings.  Eat Less Often/Avoid: Coconut oils, palm oils, butter, stick margarine, cream, half and half, cookies, candy, pie. FOR MORE INFORMATION The Dash Diet Eating Plan: www.dashdiet.org Document Released: 10/28/2011 Document Revised: 01/31/2012 Document Reviewed: 10/28/2011 [facility] Patient Information 2014 [place], [place].  Hypertension As your heart beats, it forces blood through your arteries. This force is your blood pressure. If the pressure is too high, it is called hypertension (HTN) or high blood pressure. HTN is dangerous because you may have it and not know it. High blood pressure may mean that your heart has to work harder to pump blood. Your arteries may be narrow or stiff. The extra work puts you at risk for heart disease, stroke, and other problems.  Blood pressure consists of two numbers, a higher number over a lower, 110/72, for example. It is stated as "110 over 72." The ideal is below 120 for the top number (systolic) and under 80 for the bottom (diastolic). Write down your blood pressure today. You should pay close attention to your blood pressure if you have certain conditions such as:  Heart failure.  Prior heart attack.  Diabetes  Chronic kidney disease.  Prior stroke.  Multiple risk factors for heart disease. To see if you have HTN, your blood pressure should be measured while you are seated with your arm held at the level of the heart. It should be measured at least twice. A one-time elevated blood pressure reading (especially in the Emergency Department) does not mean that you need treatment. There may be conditions in which the blood pressure is different between your right and left arms. It is important to see your caregiver soon for a recheck. Most people have essential hypertension which means that there is not a specific cause. This type  of high blood pressure may be lowered by changing lifestyle factors such as:  Stress.  Smoking.  Lack of exercise.  Excessive weight.  Drug/tobacco/alcohol use.  Eating less salt. Most people do not have symptoms from high blood pressure until it has caused damage to the body. Effective treatment can often prevent, delay or reduce that damage. TREATMENT  When a cause has been identified, treatment for high blood pressure is directed at the cause. There are a large number of medications to treat HTN. These fall into several categories, and your caregiver will help you select the medicines that are best  for you. Medications may have side effects. You should review side effects with your caregiver. If your blood pressure stays high after you have made lifestyle changes or started on medicines,   Your medication(s) may need to be changed.  Other problems may need to be addressed.  Be certain you understand your prescriptions, and know how and when to take your medicine.  Be sure to follow up with your caregiver within the time frame advised (usually within two weeks) to have your blood pressure rechecked and to review your medications.  If you are taking more than one medicine to lower your blood pressure, make sure you know how and at what times they should be taken. Taking two medicines at the same time can result in blood pressure that is too low. SEEK IMMEDIATE MEDICAL CARE IF:  You develop a severe headache, blurred or changing vision, or confusion.  You have unusual weakness or numbness, or a faint feeling.  You have severe chest or abdominal pain, vomiting, or breathing problems. MAKE SURE YOU:   Understand these instructions.  Will watch your condition.  Will get help right away if you are not doing well or get worse. Document Released: 11/08/2005 Document Revised: 01/31/2012 Document Reviewed: 06/28/2008 [facility] Patient Information 2014 [place], [place].  Treatment  Goals:  Goals (1 Years of Data) as of 06/18/13         As of Today 06/14/13 06/14/13 06/14/13 06/13/13     Blood Pressure    . Blood Pressure < 140/90  112/75 98/83 115/72 141/82 105/84     Result Component    . HEMOGLOBIN A1C < 7.0          . LDL CALC < 100            Progress Toward Treatment Goals:  Treatment Goal 06/18/2013  Hemoglobin A1C improved  Blood pressure at goal  Stop smoking smoking less    Self Care Goals & Plans:  Self Care Goal 06/18/2013  Manage my medications take my medicines as prescribed; bring my medications to every visit; refill my medications on time; follow the sick day instructions if I am sick  Monitor my health check my feet daily; keep track of my blood pressure  Eat healthy foods drink diet soda or water instead of juice or soda; eat more vegetables; eat foods that are low in salt; eat baked foods instead of fried foods; eat fruit for snacks and desserts; eat smaller portions  Be physically active find an activity I enjoy  Stop smoking -  Meeting treatment goals maintain the current self-care plan    Home Blood Glucose Monitoring 05/17/2013  Check my blood sugar 2 times a day  When to check my blood sugar before meals     Care Management & Community Referrals:  Referral 06/18/2013  Referrals made for care management support none needed  Referrals made to community resources none

## 2013-06-18 NOTE — Progress Notes (Signed)
Patient recently discharged from hospital on warfarin 5mg  PO qd since discharge. States she has been compliant to her warfarin taking 5mg  PO QD, including today's dose. INR = 1.0 Was repeated (fingerstick), still 1.0  Had fair-balanced/full-disclosure discussion regarding the role(s) of new oral anticoagulant(s) also now known as target specific anticoagulants. This discussion was prompted by the mother of the patient who was present in the exam room when she asked "is there any other option other than warfarin?". We discussed options of "bridging" with LMWH until such time that the INR was therapeutic (though with discussion with the Attending Physician and a calculation of the CHADS2 score and a determination of the value being 2, it was elected that bridging would not be necessary). 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 We discussed the observation of increased GI bleeding seen with 2 of 3 of these products (no fatal GI bleeds with the target specific drug she elected to be commenced upon). We discussed current FDA required/approved labeling that includes "there is no known  Pharmacologic reversal of these drugs"--but the patient was also reminded that vitamin K1 takes 6 hours to attenuate a PT/INR in setting of life-threatening bleeding. Theodoro Parma would be an option to reverse life-threatening bleeding with warfarin (approved indication), and prothrombin concentrate clotting factors (non-activated, e.g. Kcentra) has been studied in setting of patients with life-threatening bleeding for rivaroxaban and the  outcomes in these non-randomized clinical trial cohort populations have seen benefit. A discussion primarily relating to reduction in major bleeding except as noted but with an emphasis on a statistically significant/clinical significant reduction in ICH was had as well. We discussed no laboratory monitoring (other than adjustment for renal impairment) vs. INR monitoring with warfarin. We discussed that while efforts at frequent laboratory monitoring via the INR is in an attempt to improve outcomes of efficacy and safety--that the literature cited time in target range spans 27% to no higher than 67%. We discussed that between laboratory INR determinations--that we actually do not know what the INR value(s) is/are between visits--and that a requirement of a patient being on ANY anticoagulant would be to remain hypervigilent insofar as observations for:  Blood in urine, stools, nose-bleeds, throwing up blood, coughing up blood, increased bruising, visual distrubance(s), nausea, vomiting, projectile vomiting or any weakness in an extremity. She verbalized understanding of these issues and elects to proceed upon rivaroxaban/Xarelto therapy 20mg  ONCE-DAILY with evening meal. RTC as indicated or upon any suspicion of bleeding,etc.   After discussion with Attending Physician and the PGY2 (Dr. Shirlee Latch seeing patient today)  it is determined at present time--we will remain upon warfarin--pending a discussion of the option of rivaroxaban/Xarelto with a cardiologist relative to the indication of prevention of stroke in the setting of NVAF. The patient has been apprised, (reached her by phone at [phone]) at 2:39PM on 28-JUL-14. She was instructed to commence taking warfarin tomorrow in the following manner:  1 & 1/2 x 5mg  strength warfarin tablets (which she confirmed she was in possession of--30 day supply) = 7.5mg  warfarin for 3 days then reduce down to 5mg  PO QD warfarin x 3 days then see me in  [facility] in 7 days (1wk) at  0930h. We discussed signs and symptoms of bleeding which would prompt her to contact the clinic or report to the ED if they were to occur. She verbalized understanding of these instructions on teach-back.

## 2013-06-18 NOTE — Assessment & Plan Note (Signed)
Legs min. Improved after addition of Lasix 20  Encouraged use of TED hose daily to help with edema If this does not help consider increasing dose of Lasix to 40 mg in the future  BMET today Given info about edema

## 2013-06-18 NOTE — Assessment & Plan Note (Addendum)
Currently in AF.  INR ~1 today though patient taking Coumadin 5 mg daily since Friday She met with Dr. Alexandria Lodge initially discussed Xarelto but after reviewing case with Dr. Dalphine Handing will continue Coumadin 7.5 mg x 3 days then 5 mg dialy Repeat INR 8/4 and f/u with Dr. Alexandria Lodge 8/4 Will refer to cardiology for input as far as which tx will benefit patient the most Xarelto vs Coumadin vs other with h/o of malignancy.

## 2013-06-18 NOTE — Patient Instructions (Addendum)
Patient instructed to take medications as defined in the Anti-coagulation Track section of this encounter.  Patient instructed to CONTINUE warfarin as 7.5mg  (1 and 1/2 x 5mg  tablets) for 3 days, then decrease to 5mg  (1 tablet) by mouth daily for 3 days, then return to clinic for INR.

## 2013-06-18 NOTE — Assessment & Plan Note (Signed)
Lab Results  Component Value Date   HGBA1C 9.9 04/05/2013   HGBA1C 10.2* 11/10/2012     Assessment: Diabetes control: poor control (HgbA1C >9%) (as of 03/2013) Progress toward A1C goal:  improved Comments: improved but still not controlled. cbg 162 today  Plan: Medications:  continue current medications Home glucose monitoring:N/A Instruction/counseling given: no instruction/counseling  Educational resources provided: brochure;handout Self management tools provided: other (see comments) Other plans: will check HA1C mid 06/1513.

## 2013-06-19 ENCOUNTER — Other Ambulatory Visit: Payer: No Typology Code available for payment source | Admitting: Lab

## 2013-06-19 ENCOUNTER — Encounter: Payer: Self-pay | Admitting: Internal Medicine

## 2013-06-19 ENCOUNTER — Ambulatory Visit (HOSPITAL_COMMUNITY): Payer: No Typology Code available for payment source

## 2013-06-20 ENCOUNTER — Ambulatory Visit (HOSPITAL_BASED_OUTPATIENT_CLINIC_OR_DEPARTMENT_OTHER): Payer: No Typology Code available for payment source | Admitting: Internal Medicine

## 2013-06-20 ENCOUNTER — Telehealth: Payer: Self-pay | Admitting: Internal Medicine

## 2013-06-20 ENCOUNTER — Encounter: Payer: Self-pay | Admitting: Internal Medicine

## 2013-06-20 VITALS — BP 115/76 | HR 126 | Temp 97.9°F | Resp 20 | Ht 65.5 in | Wt 190.1 lb

## 2013-06-20 DIAGNOSIS — C3490 Malignant neoplasm of unspecified part of unspecified bronchus or lung: Secondary | ICD-10-CM

## 2013-06-20 NOTE — Progress Notes (Signed)
Pt is requesting to have [facility] call her to discuss her disease and how she is handling things. She has been feeling more depressed and afraid.  She denies any suicidal ideation.  Left message for Terri Moore-Painter  SLJ

## 2013-06-20 NOTE — Patient Instructions (Signed)
CURRENT THERAPY: Observation.  CHEMOTHERAPY INTENT: Curative  CURRENT # OF CHEMOTHERAPY CYCLES: 5  CURRENT ANTIEMETICS: Zofran, dexamethasone and Compazine  CURRENT SMOKING STATUS:  Former smoker, quit on 03/26/2013.  ORAL CHEMOTHERAPY AND CONSENT: None  CURRENT BISPHOSPHONATES USE: None  PAIN MANAGEMENT: 6/10 pain in the lower extremities secondary to bilateral edema, relieved by Tylenol.  NARCOTICS INDUCED CONSTIPATION: N/A  LIVING WILL AND CODE STATUS: Full code.  Followup visit in 3 months with repeat CT scan of the chest.

## 2013-06-20 NOTE — Progress Notes (Signed)
[facility] Telephone:([phone]) [phone]   [phone]  OFFICE PROGRESS NOTE  Windell Hummingbird, MD [address] [facility] Internal Medicine [place] [address]  DIAGNOSIS AND STAGE: Limited stage small cell lung cancer diagnosed in March of 2014.   PRIOR THERAPY: Systemic chemotherapy with carboplatin for AUC of 5 on day 1 and etoposide 120 mg/M2 days 1, 2 and 3 with Neulasta support on day 4. The patient is status post 5 cycles. This is concurrent with radiation.   CURRENT THERAPY: Observation.  CHEMOTHERAPY INTENT: Curative  CURRENT # OF CHEMOTHERAPY CYCLES: 5  CURRENT ANTIEMETICS: Zofran, dexamethasone and Compazine  CURRENT SMOKING STATUS:  Former smoker, quit on 03/26/2013.  ORAL CHEMOTHERAPY AND CONSENT: None  CURRENT BISPHOSPHONATES USE: None  PAIN MANAGEMENT: 6/10 pain in the lower extremities secondary to bilateral edema, relieved by Tylenol.  NARCOTICS INDUCED CONSTIPATION: N/A  LIVING WILL AND CODE STATUS: Full code.   INTERVAL HISTORY: Anita Gardner 57 y.o. female returns to the clinic today for followup visit accompanied by her mother. The patient continues to complain of fatigue and weakness. She tolerated the last cycle of her systemic chemotherapy fairly well with no significant adverse effects except for the fatigue. The patient was recently admitted to [facility] with atrial fibrillation and was started on treatment with diltiazem in addition to Coumadin. She was also started on Lasix for the lower extremity swelling. During her admission CT angiogram of the chest was performed which showed no evidence for pulmonary embolism and stable disease. The patient has no other significant complaints today except for the fatigue and shortness breath with exertion. She is recovering slowly from her recent chemotherapy. She is here today for evaluation and discussion of her treatment options.  MEDICAL HISTORY: Past  Medical History  Diagnosis Date  . Chronic pain of left knee     secondary to arthritis  . Hypertension   . GERD (gastroesophageal reflux disease)   . Diabetes mellitus type 2, uncontrolled, without complications DX: 2000    on oral medications only  . Constipation   . Anxiety   . Depression   . Panic attacks   . Bipolar 1 disorder   . COPD (chronic obstructive pulmonary disease)   . 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 Tyrone Sage (CVTS), Dr. Gwenyth Bouillon (Onc) // LLL mass noted 11/2012// PET scan 12/2012 - mediastinal + left infrahilar nodal metastasis AND low left mediastinum or posterior left pericardium is hypermetabolic likely nodal metastasis // Stage IIIA, [id number] at diagnosis // Bronchoscopy 01/2013 confirmed small cell ca. // Rad tx 04-06/14 // chemo started 04/14  . Status post chemotherapy 03/01/2013-5/22/2014concurrent with Radiation Therapy    carboplatin for AUC of 5 on day 1 and etoposide 120 mg/M2 days 1, 2 and 3 with Neulasta support on day 4. The patient is status post 4 cycles. This is concurrent with radiation.  . S/P radiation therapy 03/01/2013-04/12/2013    Left Lung and Nodes/ 62 Gy in 31 fractions  . Diabetic peripheral neuropathy   . Atrial fibrillation     ALLERGIES:  is allergic to ampicillin and codeine.  MEDICATIONS:  Current Outpatient Prescriptions  Medication Sig Dispense Refill  . acetaminophen (TYLENOL) 650 MG CR tablet Take 1,300 mg by mouth every 8 (eight) hours as needed for pain.      . Cholecalciferol (VITAMIN D-3) 5000 UNITS TABS Take 5,000 Units by mouth daily.      Marland Kitchen  diltiazem (CARDIZEM CD) 120 MG 24 hr capsule Take 1 capsule (120 mg total) by mouth daily.  30 capsule  0  . furosemide (LASIX) 20 MG tablet Take 1 tablet (20 mg total) by mouth daily as needed.  30 tablet  0  . glyBURIDE (DIABETA) 5 MG tablet Take 5 mg by mouth 2 (two) times daily with a meal.      . metFORMIN (GLUCOPHAGE) 1000 MG tablet Take 1,000 mg by mouth 2  (two) times daily with a meal.      . Multiple Vitamin (MULTIVITAMIN WITH MINERALS) TABS Take 1 tablet by mouth daily.      . Omega-3 Fatty Acids (FISH OIL) 1200 MG CAPS Take 1,200 mg by mouth daily.      Marland Kitchen oxybutynin (DITROPAN) 5 MG tablet Take 5 mg by mouth 3 (three) times daily.      . prochlorperazine (COMPAZINE) 10 MG tablet Take 10 mg by mouth every 6 (six) hours as needed (for nausea).      . ranitidine (ZANTAC) 150 MG tablet Take 150 mg by mouth 2 (two) times daily.      . temazepam (RESTORIL) 30 MG capsule Take 30 mg by mouth at bedtime as needed for sleep.       No current facility-administered medications for this visit.    SURGICAL HISTORY:  Past Surgical History  Procedure Laterality Date  . Left leg surgery      as a child  . Video bronchoscopy with endobronchial ultrasound N/A 02/14/2013    Procedure: VIDEO BRONCHOSCOPY WITH ENDOBRONCHIAL ULTRASOUND;  Surgeon: Delight Ovens, MD;  Location: [facility] OR;  Service: Thoracic;  Laterality: N/A;    REVIEW OF SYSTEMS:  A comprehensive review of systems was negative except for: Constitutional: positive for fatigue Respiratory: positive for dyspnea on exertion   PHYSICAL EXAMINATION: General appearance: alert, cooperative and no distress Head: Normocephalic, without obvious abnormality, atraumatic Neck: no adenopathy Lymph nodes: Cervical, supraclavicular, and axillary nodes normal. Resp: clear to auscultation bilaterally Cardio: regular rate and rhythm, S1, S2 normal, no murmur, click, rub or gallop GI: soft, non-tender; bowel sounds normal; no masses,  no organomegaly Extremities: extremities normal, atraumatic, no cyanosis or edema Neurologic: Alert and oriented X 3, normal strength and tone. Normal symmetric reflexes. Normal coordination and gait  ECOG PERFORMANCE STATUS: 1 - Symptomatic but completely ambulatory  Blood pressure 115/76, pulse 126, temperature 97.9 F (36.6 C), temperature source Oral, resp. rate 20, height  5' 5.5" (1.664 m), weight 190 lb 1.6 oz (86.229 kg).  LABORATORY DATA: Lab Results  Component Value Date   WBC 5.8 06/13/2013   HGB 9.2* 06/13/2013   HCT 27.9* 06/13/2013   MCV 105.7* 06/13/2013   PLT 211 06/13/2013      Chemistry      Component Value Date/Time   NA 136 06/18/2013 1053   NA 137 06/04/2013 1337   K 4.2 06/18/2013 1053   K 3.8 06/04/2013 1337   CL 98 06/18/2013 1053   CL 102 05/14/2013 1143   CO2 29 06/18/2013 1053   CO2 23 06/04/2013 1337   BUN 10 06/18/2013 1053   BUN 5.7* 06/04/2013 1337   CREATININE 0.52 06/18/2013 1053   CREATININE 0.55 06/13/2013 0407   CREATININE 0.6 06/04/2013 1337      Component Value Date/Time   CALCIUM 9.9 06/18/2013 1053   CALCIUM 9.3 06/04/2013 1337   ALKPHOS 93 06/12/2013 1711   ALKPHOS 97 06/04/2013 1337   AST 18 06/12/2013 1711  AST 20 06/04/2013 1337   ALT 25 06/12/2013 1711   ALT 40 06/04/2013 1337   BILITOT 0.5 06/12/2013 1711   BILITOT 0.44 06/04/2013 1337       RADIOGRAPHIC STUDIES: Ct Angio Chest Pe W/cm &/or Wo Cm  06/12/2013   *RADIOLOGY REPORT*  Clinical Data: Squamous cell lung cancer with nodal metastases. Bilateral leg swelling.  New onset of atrial fibrillation.  CT ANGIOGRAPHY CHEST  Technique:  Multidetector CT imaging of the chest using the standard protocol during bolus administration of intravenous contrast. Multiplanar reconstructed images including MIPs were obtained and reviewed to evaluate the vascular anatomy.  Contrast: OMNIPAQUE IOHEXOL 350 MG/ML SOLN  Comparison: CT of the thorax 04/09/2013.  Findings:  Mediastinum: Study is slightly limited by patient respiratory motion.  With these limitations in mind, there is no evidence to suggest clinically relevant central, lobar or segmental sized filling defect.  Smaller subsegmental sized filling defects cannot be entirely excluded. Heart size is mildly enlarged. Small amount of pericardial fluid and/or thickening anteriorly, unlikely to be of any hemodynamic significance at  this time.  No associated pericardial calcification. There is atherosclerosis of the thoracic aorta, the great vessels of the mediastinum and the coronary arteries, including calcified atherosclerotic plaque in the left anterior descending, left circumflex and right coronary arteries. Mild calcifications of the mitral valve.  Prominent lymphoid tissue in the left hilar region appears slightly larger than prior examinations, with the largest node or nodal conglomerate measuring up to 1.9 x 1.7 cm on image 42 of series 4.  No definite pathologically enlarged mediastinal or right hilar lymph nodes are noted on today's examination.  Mild circumferential thickening of the distal two thirds of the esophagus may suggest esophagitis.  Lungs/Pleura: A small pleural-based nodule is again noted in the periphery of the left lower lobe measuring 1.2 x 0.6 cm on today's examination (image 59 of series 6), which is similar in size to the prior study when allowing for slight differences in measurement technique.  Compared to the prior examination there is now extensive thickening of the bronchial walls and mild thickening of the peribronchovascular interstitium adjacent to the left lower lobe bronchi which appear narrowed on today's study.  Several of these bronchi appear to be filled with fluid, likely retained secretions.  There is septal thickening throughout the inferior aspect of the left lower lobe, suggesting some regional edema.  The left lower lobe also appears slightly hyperlucent, which may be a reflection of some air trapping related to the previously discussed bronchial findings.  Mild diffuse bronchial wall thickening is noted in other regions of the lungs bilaterally as well.  No other definite new suspicious appearing pulmonary nodules or masses are identified.  There is a small left pleural effusion layering dependently which is simple in appearance.  Upper Abdomen: Unremarkable.  Musculoskeletal: There are no  aggressive appearing lytic or blastic lesions noted in the visualized portions of the skeleton.  IMPRESSION: 1.  Despite the limitations of today's examination there is no evidence to suggest clinically relevant central, lobar or segmental sized pulmonary embolism. 2.  Left lower lobe pulmonary nodule is similar in size to the prior study, but is now associated with surrounding septal thickening suggestive of some regional edema. There is also a new small left pleural effusion which is simple in appearance. In addition, there is increasing bronchial wall thickening and mild thickening of the peribronchovascular interstitium throughout the left lower lobe resulting in significant air trapping.  These findings  may be related to prior radiation therapy. 2.  Left hilar adenopathy appears increased compared to the prior study.  Whether not this is neoplastic or simply reactive to the process in the left lower lobe is uncertain, but attention on follow-up studies is strongly recommended. 4.  Circumferential thickening of the distal two thirds of the esophagus suggestive of esophagitis, which may be treatment related. 5.  Cardiomegaly with a small amount of pericardial fluid and/or thickening, which is unlikely to be of hemodynamic significance at this time. 6. Atherosclerosis, including three-vessel coronary artery disease. Please note that although the presence of coronary artery calcium documents the presence of coronary artery disease, the severity of this disease and any potential stenosis cannot be assessed on this non-gated CT examination.  Assessment for potential risk factor modification, dietary therapy or pharmacologic therapy may be warranted, if clinically indicated. 7.  Additional incidental findings, as above.   Original Report Authenticated By: Trudie Reed, M.D.    ASSESSMENT AND PLAN: This is a very pleasant 57 years old white female was limited stage small cell lung cancer status post 5 cycles of  systemic chemotherapy concurrent with radiation with significant improvement in her disease. I recommended for the patient to continue on observation for now with repeat CT scan of the chest in 3 months. I also advised the patient to contact Dr. Basilio Cairo for evaluation and consideration of prophylactic cranial irradiation. For the atrial fibrillation, the patient will continue on treatment with diltiazem and Coumadin as prescribed by her primary care physician She was advised to call immediately if she has any concerning symptoms in the interval.  The patient voices understanding of current disease status and treatment options and is in agreement with the current care plan.  All questions were answered. The patient knows to call the clinic with any problems, questions or concerns. We can certainly see the patient much sooner if necessary.

## 2013-06-20 NOTE — Telephone Encounter (Signed)
gv and printed appt sched and avs for pt  °

## 2013-06-21 NOTE — Progress Notes (Signed)
I had a discussion with Dr. Alexandria Lodge about the anticoagulation of this patient. At this time, we would like to have cardiology and oncology inputs to decide on the right anticoagulation of this patient, and continue with warfarin for now. We have sent notes to both specialities' physicians about advising [place] regarding this issue. I agree with the documentation provided by Dr. Alexandria Lodge, and we, along with Dr. Shirlee Latch will continue to follow.

## 2013-06-21 NOTE — Progress Notes (Signed)
Found an order for consult tp palliative care. Talked with Tobi Bastos - see social worker's note. Stanton Kidney Gerard Bonus RN 06/21/13 3:45PM

## 2013-06-22 ENCOUNTER — Encounter: Payer: Self-pay | Admitting: Radiation Oncology

## 2013-06-22 ENCOUNTER — Ambulatory Visit
Admission: RE | Admit: 2013-06-22 | Discharge: 2013-06-22 | Disposition: A | Discharge status: Home / Self Care | Payer: No Typology Code available for payment source | Source: Ambulatory Visit | Attending: Radiation Oncology | Admitting: Radiation Oncology

## 2013-06-22 NOTE — Progress Notes (Signed)
Follow up lung rad txs 03/01/13-04/12/13  Slight sob with exertion, new dx Atrial fibrillation on Coumadin 5mg  daily since 06/15/13,labs this Monday, 96% room air, coughing up pheglm, clear, nause sometimes stated, started on cardizem and lasix also, no c/o pain, appetite so-so , forces herself to eat, gets hungry,  In w/c, and cane,  3:48 PM

## 2013-06-22 NOTE — Progress Notes (Signed)
Radiation Oncology         [phone] ________________________________  Name: Anita Gardner MRN: [id number]  Date: 06/22/2013  DOB: 14-Feb-1956  Follow-Up Visit Note  Outpatient  CC: Windell Hummingbird, MD  Windell Hummingbird, MD  Diagnosis and Prior Radiotherapy:   Stage IIIA Small Cell Lung Cancer  Indication for treatment: Curative  Radiation treatment dates: 03/01/2013-04/12/2013  Site/dose: Left Lung and Nodes/ 62 Gy in 31 fractions  Narrative:  The patient returns today for routine follow-up.  Feels relatively good.  Afib was recently  diagnosed and she is on Cardizem and Coumadin for this. She denies any new headaches or neurologic symptoms. Her vision is more blurry since she started chemotherapy, she reports . CT angiogram was performed recently which demonstrates that the disease in her lungs is relatively stable (though this was not a diagnostic scan) compared to her prior scan. If the CT angiogram is compared to pretreatment scans, she has had a significant response to chemoradiation. She sometimes coughs up clear phlegm. She denies pain. She ambulates at home with a cane   ALLERGIES:  is allergic to ampicillin and codeine.  Meds: Current Outpatient Prescriptions  Medication Sig Dispense Refill  . Cholecalciferol (VITAMIN D-3) 5000 UNITS TABS Take 5,000 Units by mouth daily.      Marland Kitchen diltiazem (CARDIZEM CD) 120 MG 24 hr capsule Take 1 capsule (120 mg total) by mouth daily.  30 capsule  0  . furosemide (LASIX) 20 MG tablet Take 1 tablet (20 mg total) by mouth daily as needed.  30 tablet  0  . glyBURIDE (DIABETA) 5 MG tablet Take 5 mg by mouth 2 (two) times daily with a meal.      . metFORMIN (GLUCOPHAGE) 1000 MG tablet Take 1,000 mg by mouth 2 (two) times daily with a meal.      . Multiple Vitamin (MULTIVITAMIN WITH MINERALS) TABS Take 1 tablet by mouth daily.      . Omega-3 Fatty Acids (FISH OIL) 1200 MG CAPS Take 1,200 mg by mouth daily.      Marland Kitchen oxybutynin (DITROPAN) 5 MG  tablet Take 5 mg by mouth 3 (three) times daily.      . prochlorperazine (COMPAZINE) 10 MG tablet Take 10 mg by mouth every 6 (six) hours as needed (for nausea).      . ranitidine (ZANTAC) 150 MG tablet Take 150 mg by mouth 2 (two) times daily.      Marland Kitchen warfarin (COUMADIN) 5 MG tablet Take 5 mg by mouth daily.      Marland Kitchen acetaminophen (TYLENOL) 650 MG CR tablet Take 1,300 mg by mouth every 8 (eight) hours as needed for pain.      Marland Kitchen temazepam (RESTORIL) 30 MG capsule Take 30 mg by mouth at bedtime as needed for sleep.       No current facility-administered medications for this encounter.    Physical Findings: The patient is in no acute distress. Patient is alert and oriented.  weight is 186 lb 3.2 oz (84.46 kg). Her oral temperature is 97.8 F (36.6 C). Her blood pressure is 111/77 and her pulse is 104. Her respiration is 20 and oxygen saturation is 96%. . Lungs clear to auscultation bilaterally. Heart is not tachycardic on auscultation but rhythm is irregularly irregular. Neurologically her cranial nerves are grossly intact  Lab Findings: Lab Results  Component Value Date   WBC 5.8 06/13/2013   HGB 9.2* 06/13/2013   HCT 27.9* 06/13/2013   MCV 105.7* 06/13/2013  PLT 211 06/13/2013    Radiographic Findings: Ct Angio Chest Pe W/cm &/or Wo Cm  06/12/2013   *RADIOLOGY REPORT*  Clinical Data: Squamous cell lung cancer with nodal metastases. Bilateral leg swelling.  New onset of atrial fibrillation.  CT ANGIOGRAPHY CHEST  Technique:  Multidetector CT imaging of the chest using the standard protocol during bolus administration of intravenous contrast. Multiplanar reconstructed images including MIPs were obtained and reviewed to evaluate the vascular anatomy.  Contrast: OMNIPAQUE IOHEXOL 350 MG/ML SOLN  Comparison: CT of the thorax 04/09/2013.  Findings:  Mediastinum: Study is slightly limited by patient respiratory motion.  With these limitations in mind, there is no evidence to suggest clinically  relevant central, lobar or segmental sized filling defect.  Smaller subsegmental sized filling defects cannot be entirely excluded. Heart size is mildly enlarged. Small amount of pericardial fluid and/or thickening anteriorly, unlikely to be of any hemodynamic significance at this time.  No associated pericardial calcification. There is atherosclerosis of the thoracic aorta, the great vessels of the mediastinum and the coronary arteries, including calcified atherosclerotic plaque in the left anterior descending, left circumflex and right coronary arteries. Mild calcifications of the mitral valve.  Prominent lymphoid tissue in the left hilar region appears slightly larger than prior examinations, with the largest node or nodal conglomerate measuring up to 1.9 x 1.7 cm on image 42 of series 4.  No definite pathologically enlarged mediastinal or right hilar lymph nodes are noted on today's examination.  Mild circumferential thickening of the distal two thirds of the esophagus may suggest esophagitis.  Lungs/Pleura: A small pleural-based nodule is again noted in the periphery of the left lower lobe measuring 1.2 x 0.6 cm on today's examination (image 59 of series 6), which is similar in size to the prior study when allowing for slight differences in measurement technique.  Compared to the prior examination there is now extensive thickening of the bronchial walls and mild thickening of the peribronchovascular interstitium adjacent to the left lower lobe bronchi which appear narrowed on today's study.  Several of these bronchi appear to be filled with fluid, likely retained secretions.  There is septal thickening throughout the inferior aspect of the left lower lobe, suggesting some regional edema.  The left lower lobe also appears slightly hyperlucent, which may be a reflection of some air trapping related to the previously discussed bronchial findings.  Mild diffuse bronchial wall thickening is noted in other regions of  the lungs bilaterally as well.  No other definite new suspicious appearing pulmonary nodules or masses are identified.  There is a small left pleural effusion layering dependently which is simple in appearance.  Upper Abdomen: Unremarkable.  Musculoskeletal: There are no aggressive appearing lytic or blastic lesions noted in the visualized portions of the skeleton.  IMPRESSION: 1.  Despite the limitations of today's examination there is no evidence to suggest clinically relevant central, lobar or segmental sized pulmonary embolism. 2.  Left lower lobe pulmonary nodule is similar in size to the prior study, but is now associated with surrounding septal thickening suggestive of some regional edema. There is also a new small left pleural effusion which is simple in appearance. In addition, there is increasing bronchial wall thickening and mild thickening of the peribronchovascular interstitium throughout the left lower lobe resulting in significant air trapping.  These findings may be related to prior radiation therapy. 2.  Left hilar adenopathy appears increased compared to the prior study.  Whether not this is neoplastic or simply reactive  to the process in the left lower lobe is uncertain, but attention on follow-up studies is strongly recommended. 4.  Circumferential thickening of the distal two thirds of the esophagus suggestive of esophagitis, which may be treatment related. 5.  Cardiomegaly with a small amount of pericardial fluid and/or thickening, which is unlikely to be of hemodynamic significance at this time. 6. Atherosclerosis, including three-vessel coronary artery disease. Please note that although the presence of coronary artery calcium documents the presence of coronary artery disease, the severity of this disease and any potential stenosis cannot be assessed on this non-gated CT examination.  Assessment for potential risk factor modification, dietary therapy or pharmacologic therapy may be warranted,  if clinically indicated. 7.  Additional incidental findings, as above.   Original Report Authenticated By: Trudie Reed, M.D.    Impression/Plan:  I spoke with the patient today about prophylactic cranial irradiation. I would recommend this treatment to decrease her risk of failing in the brain parenchyma. I told her that small cell lung cancer has a predisposition to metastasize to the brain. I explained that chemotherapy has difficulty crossing the blood-brain barrier and therefore it is not effective at preventing brain metastases from forming in the future. I estimated, based on meta-analysis data, that her risk of developing brain tumors in the future is approximately 60% and this risk would be lowered to 30% with prophylactic cranial radiation. I acknowledged that there may be a small improvement in her life expectancy from prophylactic cranial irradiation based on the medical literature as well.  I would prescribe a dose of 25 Gray in 10 fractions which would be delivered over about 2 weeks. I explained that doses have been modified based on clinical data in order to lessen the risk of serious side effects. That being said, I did acknowledge numerous acute and late side effects that can occur from whole brain radiation. We spoke about the fatigue and skin irritation and hair loss that can occur in in the acute setting. We discussed late effects including but not necessarily limited to slowing of cognition and decreased short-term memory. I told her that it is unlikely that these cognitive changes would be debilitating, but they could be noticeable in the future. It is sometimes hard to tell whether such late effects are from chemotherapy versus radiotherapy.   After lengthy discussion, the patient would prefer to hold off on any interventions. She is rather "wait and see." Therefore, I will order an MRI for surveillance in several weeks and followup thereafter.  I did receive a note from our social  worker regarding the patient's interest in hospice. Today, she denies any interest in hospice.  I spent 25 minutes face to face with the patient and more than 50% of that time was spent in counseling and/or coordination of care. _____________________________________   Lonie Peak, MD

## 2013-06-25 ENCOUNTER — Ambulatory Visit (INDEPENDENT_AMBULATORY_CARE_PROVIDER_SITE_OTHER): Payer: No Typology Code available for payment source | Admitting: Pharmacist

## 2013-06-25 ENCOUNTER — Telehealth: Payer: Self-pay | Admitting: *Deleted

## 2013-06-25 ENCOUNTER — Other Ambulatory Visit: Payer: No Typology Code available for payment source

## 2013-06-25 DIAGNOSIS — Z7901 Long term (current) use of anticoagulants: Secondary | ICD-10-CM

## 2013-06-25 DIAGNOSIS — I4891 Unspecified atrial fibrillation: Secondary | ICD-10-CM

## 2013-06-25 MED ORDER — RIVAROXABAN 20 MG PO TABS: 20.0000 mg | ORAL_TABLET | Freq: Every day | ORAL | Status: AC

## 2013-06-25 NOTE — Progress Notes (Signed)
Anti-Coagulation Progress Note  Anita Gardner is a 57 y.o. female who is currently on an anti-coagulation regimen.    RECENT RESULTS: Recent results are below, the most recent result is correlated with a dose of 37.5mg  over the past 6 days. She still remains at baseline. I have called the [facility] to confirm that that her MEDICAID number she has is a "working" number. Monica the pharmacist ran the number and it went through successfully for MEDICAID copay of $3.00. Given her subtherapeutic response for past 2 visits this seems appropriate and particularly given that we had the requisite fair balance/full-disclosure discussions last week. Again---she is reminded that no different than with warfarin--that between visits--she should be hypervigilant for observation of any signs or symptoms that might suggest her blood being "too thin". These were reinforced as being: blood in urine, stool, nosebleeds, vaginal bleeding, coughing up blood, throwing up blood, increased bruising, etc. She verbalized understanding of these patient responsibilities while being anticoagulated to protect against embolic stroke in the setting of atrial fibrillation. We will discontinue warfarin, commence rivaroxaban 20mg  PO QD with supper/evening meal. Will monitor her renal status. Should her creatinine clearance fall to within 15-50mL/min then we would adjust the dose DOWN to 15mg  PO rivaroxaban/Xarelto ONCE DAILY still with PM meal.  Lab Results  Component Value Date   INR 1.0 06/25/2013   INR 1.0 06/18/2013   INR 1.07 06/13/2013    ANTI-COAG DOSE: Anticoagulation Dose Instructions as of 06/25/2013     Glynis Smiles Tue Wed Thu Fri Sat   New Dose 5 mg 0 mg 7.5 mg 7.5 mg 7.5 mg 5 mg 5 mg       ANTICOAG SUMMARY:   ANTICOAG TODAY: Anticoagulation Summary as of 06/25/2013   INR goal   Selected INR 1.0 (06/25/2013)  Next INR check   Target end date Indefinite   Indications  Atrial fibrillation [427.31] Long term (current) use  of anticoagulants [V58.61]      Anticoagulation Episode Summary   INR check location    Preferred lab    Discontinue date 06/25/2013   Discontinue reason Alternate Therapy Initiated   Send INR reminders to    Comments With advice and consent of the patient after fair balance (efficacy & safety)/full-disclosure(s) discussion(s) with PCP as well as anticoagulation clinic pharmacist the patient elects to commence rivaroxaban/Xarelto 20mg  po qd with evening meal.      PATIENT INSTRUCTIONS: Patient Instructions  Patient instructed to take medications as defined in the Anti-coagulation Track section of this encounter.  Patient instructed to DISCONTINUE WARFARIN. Will commence Xarelto 20mg  by mouth daily with evening meal.  Patient verbalized understanding of these instructions.       FOLLOW-UP Return if symptoms worsen or fail to improve.  Hulen Luster, III Pharm.D., CACP

## 2013-06-25 NOTE — Telephone Encounter (Signed)
Patient seeing internal medicine for PT/INR labs and asked about upcoming labs.  Dondra Spry asked what tests are due and when.  Informed her of labs due September 19, 2013 before f/u on 09-20-2013.

## 2013-06-25 NOTE — Patient Instructions (Signed)
Patient instructed to take medications as defined in the Anti-coagulation Track section of this encounter.  Patient instructed to DISCONTINUE WARFARIN. Will commence Xarelto 20mg  by mouth daily with evening meal.  Patient verbalized understanding of these instructions.

## 2013-06-26 NOTE — Progress Notes (Signed)
Case discussed with Dr. McLean at the time of the visit.  We reviewed the resident's history and exam and pertinent patient test results.  I agree with the assessment, diagnosis, and plan of care documented in the resident's note.     

## 2013-07-03 ENCOUNTER — Ambulatory Visit (INDEPENDENT_AMBULATORY_CARE_PROVIDER_SITE_OTHER): Payer: No Typology Code available for payment source | Admitting: Cardiovascular Disease

## 2013-07-03 VITALS — BP 118/81 | HR 115 | Wt 190.0 lb

## 2013-07-03 DIAGNOSIS — E785 Hyperlipidemia, unspecified: Secondary | ICD-10-CM

## 2013-07-03 DIAGNOSIS — I4891 Unspecified atrial fibrillation: Secondary | ICD-10-CM

## 2013-07-03 DIAGNOSIS — I10 Essential (primary) hypertension: Secondary | ICD-10-CM

## 2013-07-03 DIAGNOSIS — C3490 Malignant neoplasm of unspecified part of unspecified bronchus or lung: Secondary | ICD-10-CM

## 2013-07-03 NOTE — Assessment & Plan Note (Signed)
Well controlled.  Continue current medications and low sodium Dash type diet.    

## 2013-07-03 NOTE — Assessment & Plan Note (Signed)
F/U MRI in October and f/u with oncology  Seems improved after last chemo

## 2013-07-03 NOTE — Assessment & Plan Note (Signed)
Cholesterol is at goal.  Continue current dose of statin and diet Rx.  No myalgias or side effects.  F/U  LFT's in 6 months. Lab Results  Component Value Date   LDLCALC 110* 11/10/2012

## 2013-07-03 NOTE — Patient Instructions (Signed)
Your physician recommends that you schedule a follow-up appointment in:  3 MONTHS WITH  DR NISHAN  Your physician recommends that you continue on your current medications as directed. Please refer to the Current Medication list given to you today.  

## 2013-07-03 NOTE — Assessment & Plan Note (Signed)
Long discussion with patient about anticoagulation and cardioversion. She prefers to wait until November to make decision. I prefer to do [facility] after 3 weeks of xarelto.  Her echo shows no atrial enlargement  And I think we could restore NSR

## 2013-07-03 NOTE — Progress Notes (Signed)
[name], female   DOB: [birth year], 57 y.o.   MRN: [id number] 57 yo referred for Afib.  Documented in July.  She is unaware of it  F/U echo reviewed and atrial sizes normal.  EF normal and no bad valves. Has lung CA S/O XRT and last chemo in July.  F/U MRI in October but prognosis seems better. Former smoker and CA diagnosed in January.  Somewhat poorly controlled DM and LE edema improved with lasix  Duplex in June with no RLE DVT.  Sees Chancy Milroy and was on coumadin Changed to xarelto one week ago No bleeding issues.  She is unaware of afib and has no dyspnea chest pain palpitations or syncope  ROS: Denies fever, malais, weight loss, blurry vision, decreased visual acuity, cough, sputum, SOB, hemoptysis, pleuritic pain, palpitaitons, heartburn, abdominal pain, melena, lower extremity edema, claudication, or rash.  All other systems reviewed and negative   General: Affect appropriate Older than stated age with alopecia HEENT: normal Neck supple with no adenopathy JVP normal no bruits no thyromegaly Lungs clear with no wheezing and good diaphragmatic motion Heart:  S1/S2 no murmur,rub, gallop or click PMI normal Abdomen: benighn, BS positve, no tenderness, no AAA no bruit.  No HSM or HJR Distal pulses intact with no bruits No edema Neuro non-focal Skin warm and dry No muscular weakness  Medications Current Outpatient Prescriptions  Medication Sig Dispense Refill  . acetaminophen (TYLENOL) 650 MG CR tablet Take 1,300 mg by mouth every 8 (eight) hours as needed for pain.      . Cholecalciferol (VITAMIN D-3) 5000 UNITS TABS Take 5,000 Units by mouth daily.      Marland Kitchen diltiazem (CARDIZEM CD) 120 MG 24 hr capsule Take 1 capsule (120 mg total) by mouth daily.  30 capsule  0  . furosemide (LASIX) 20 MG tablet Take 1 tablet (20 mg total) by mouth daily as needed.  30 tablet  0  . glyBURIDE (DIABETA) 5 MG tablet Take 5 mg by mouth 2 (two) times daily with a meal.      . metFORMIN  (GLUCOPHAGE) 1000 MG tablet Take 1,000 mg by mouth 2 (two) times daily with a meal.      . Multiple Vitamin (MULTIVITAMIN WITH MINERALS) TABS Take 1 tablet by mouth daily.      . Omega-3 Fatty Acids (FISH OIL) 1200 MG CAPS Take 1,200 mg by mouth daily.      Marland Kitchen oxybutynin (DITROPAN) 5 MG tablet Take 5 mg by mouth 3 (three) times daily.      . prochlorperazine (COMPAZINE) 10 MG tablet Take 10 mg by mouth every 6 (six) hours as needed (for nausea).      . ranitidine (ZANTAC) 150 MG tablet Take 150 mg by mouth 2 (two) times daily.      . Rivaroxaban (XARELTO) 20 MG TABS tablet Take 1 tablet (20 mg total) by mouth daily with supper.  30 tablet  3  . temazepam (RESTORIL) 30 MG capsule Take 30 mg by mouth at bedtime as needed for sleep.       No current facility-administered medications for this visit.    Allergies Ampicillin and Codeine  Family History: Family History  Problem Relation Age of Onset  . CAD Mother 33  . Diabetes Mother   . Heart disease Mother     h/o MI    Social History: History   Social History  . Marital Status: Divorced    Spouse Name: N/A  Number of Children: 2  . Years of Education: 10th grade   Occupational History  . 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 Smoking status: Former Smoker -- 0.10 packs/day for 40 years    Types: Cigarettes    Quit date: 03/26/2013  . Smokeless tobacco: Never Used     Comment: has now quit  . Alcohol Use: No  . Drug Use: No  . Sexually Active: Not on file   Other Topics Concern  . Not on file   Social History Narrative   Lives at [place] alone, 2 children, divorced          Electrocardiogram:  7/23 7/25 and 7/28  AFib rates 104 to 130  Otherwise normal.  ICRBBB  Last ECG 02/16/13 Was SR rate 76 with ICRBBB  Assessment and Plan

## 2013-07-04 ENCOUNTER — Other Ambulatory Visit: Payer: Self-pay | Admitting: *Deleted

## 2013-07-05 MED ORDER — FUROSEMIDE 20 MG PO TABS: 20.0000 mg | ORAL_TABLET | Freq: Every day | ORAL | Status: DC | PRN

## 2013-07-06 ENCOUNTER — Other Ambulatory Visit: Payer: Self-pay | Admitting: *Deleted

## 2013-07-11 MED ORDER — DILTIAZEM HCL ER COATED BEADS 120 MG PO CP24: 120.0000 mg | ORAL_CAPSULE | Freq: Every day | ORAL | Status: DC

## 2013-07-11 NOTE — Telephone Encounter (Signed)
Pt is out of Diltiazem, several calls. No response from Dr Darci Needle, please refill

## 2013-07-13 ENCOUNTER — Telehealth: Payer: Self-pay | Admitting: *Deleted

## 2013-07-13 NOTE — Telephone Encounter (Signed)
Pt called leaving message she is swelling since starting new medicine, tried to call her back no answer

## 2013-07-17 ENCOUNTER — Ambulatory Visit: Payer: No Typology Code available for payment source | Admitting: Internal Medicine

## 2013-07-19 ENCOUNTER — Ambulatory Visit (INDEPENDENT_AMBULATORY_CARE_PROVIDER_SITE_OTHER): Payer: No Typology Code available for payment source | Admitting: Internal Medicine

## 2013-07-19 ENCOUNTER — Encounter: Payer: Self-pay | Admitting: Internal Medicine

## 2013-07-19 VITALS — BP 112/71 | HR 110 | Temp 97.4°F | Ht 64.5 in | Wt 187.8 lb

## 2013-07-19 DIAGNOSIS — M791 Myalgia, unspecified site: Secondary | ICD-10-CM

## 2013-07-19 DIAGNOSIS — IMO0001 Reserved for inherently not codable concepts without codable children: Secondary | ICD-10-CM

## 2013-07-19 DIAGNOSIS — I10 Essential (primary) hypertension: Secondary | ICD-10-CM

## 2013-07-19 DIAGNOSIS — D649 Anemia, unspecified: Secondary | ICD-10-CM

## 2013-07-19 DIAGNOSIS — K029 Dental caries, unspecified: Secondary | ICD-10-CM

## 2013-07-19 DIAGNOSIS — D539 Nutritional anemia, unspecified: Secondary | ICD-10-CM | POA: Insufficient documentation

## 2013-07-19 DIAGNOSIS — I4891 Unspecified atrial fibrillation: Secondary | ICD-10-CM

## 2013-07-19 LAB — CBC
HCT: 39.3 % (ref 36.0–46.0)
Hemoglobin: 13.8 g/dL (ref 12.0–15.0)
MCV: 100 fL (ref 78.0–100.0)
RDW: 13.6 % (ref 11.5–15.5)
WBC: 5.5 10*3/uL (ref 4.0–10.5)

## 2013-07-19 LAB — BASIC METABOLIC PANEL WITH GFR
BUN: 11 mg/dL (ref 6–23)
CO2: 26 mEq/L (ref 19–32)
Chloride: 102 mEq/L (ref 96–112)
Creat: 0.63 mg/dL (ref 0.50–1.10)
GFR, Est Non African American: >89 mL/min
Potassium: 4.1 mEq/L (ref 3.5–5.3)

## 2013-07-19 LAB — GLUCOSE, CAPILLARY: Glucose-Capillary: 186 mg/dL — ABNORMAL HIGH (ref 70–99)

## 2013-07-19 LAB — MAGNESIUM: Magnesium: 1.4 mg/dL — ABNORMAL LOW (ref 1.5–2.5)

## 2013-07-19 MED ORDER — METOPROLOL TARTRATE 25 MG PO TABS: 25.0000 mg | ORAL_TABLET | Freq: Two times a day (BID) | ORAL | Status: AC

## 2013-07-19 NOTE — Progress Notes (Signed)
[name], female   DOB: 30-Mar-1956, 57 y.o.   MRN: [id number]   Subjective:   HPI: Anita Gardner is a 57 y.o. 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  During her hospitalization on 06/14/2013 for new onset atrial fibrillation, she was also found to have bilateral lower extremity edema. Dopplers were negative for DVT. She was discharged with furosemide 20 mg daily when necessary. She reports compliance with this medication taking it daily since going home, which has helped. However, patient believes that her bilateral lower extremity edema is caused by Cardizem she has stopped taking for a week now. She also reports pain in her legs which she believes is from Xarelto. She has however continued with this medications without missing a dose.  She describes the pain as constant, 5/10, mainly in the thighs. She has been taking Tylenol and OTC topical creams for pain with some relief. No tingling, or numbness. No constitutional symptoms.  No weight gain on chart review.  Kindly see the A&P for the status of the pt's chronic medical problems.  Past Medical History  Diagnosis Date  . Chronic pain of left knee     secondary to arthritis  . Hypertension   . GERD (gastroesophageal reflux disease)   . Diabetes mellitus type 2, uncontrolled, without complications DX: 2000    on oral medications only  . Constipation   . Anxiety   . Depression   . Panic attacks   . Bipolar 1 disorder   . COPD (chronic obstructive pulmonary disease)   . 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 Tyrone Sage (CVTS), Dr. Gwenyth Bouillon (Onc) // LLL mass noted 11/2012// PET scan 12/2012 - mediastinal + left infrahilar nodal metastasis AND low left mediastinum or  posterior left pericardium is hypermetabolic likely nodal metastasis // Stage IIIA, [id number] at diagnosis // Bronchoscopy 01/2013 confirmed small cell ca. // Rad tx 04-06/14 // chemo started 04/14  . Status post chemotherapy 03/01/2013-5/22/2014concurrent with Radiation Therapy    carboplatin for AUC of 5 on day 1 and etoposide 120 mg/M2 days 1, 2 and 3 with Neulasta support on day 4. The patient is status post 4 cycles. This is concurrent with radiation.  . S/P radiation therapy 03/01/2013-04/12/2013    Left Lung and Nodes/ 62 Gy in 31 fractions  . Diabetic peripheral neuropathy   . Atrial fibrillation    Current Outpatient Prescriptions  Medication Sig Dispense Refill  . acetaminophen (TYLENOL) 650 MG CR tablet Take 1,300 mg by mouth every 8 (eight) hours as needed for pain.      . Cholecalciferol (VITAMIN D-3) 5000 UNITS TABS Take 5,000 Units by mouth daily.      Marland Kitchen diltiazem (CARDIZEM CD) 120 MG 24 hr capsule Take 1 capsule (120 mg total) by mouth daily.  90 capsule  1  . furosemide (LASIX) 20 MG tablet Take 1 tablet (20 mg total) by mouth daily as needed.  45 tablet  0  . glyBURIDE (DIABETA) 5 MG tablet Take 5 mg by mouth 2 (two) times daily with a meal.      . metFORMIN (GLUCOPHAGE) 1000 MG tablet Take 1,000 mg by mouth 2 (two) times daily with a meal.      . Multiple Vitamin (MULTIVITAMIN WITH MINERALS) TABS  Take 1 tablet by mouth daily.      . Omega-3 Fatty Acids (FISH OIL) 1200 MG CAPS Take 1,200 mg by mouth daily.      Marland Kitchen oxybutynin (DITROPAN) 5 MG tablet Take 5 mg by mouth 3 (three) times daily.      . prochlorperazine (COMPAZINE) 10 MG tablet Take 10 mg by mouth every 6 (six) hours as needed (for nausea).      . ranitidine (ZANTAC) 150 MG tablet Take 150 mg by mouth 2 (two) times daily.      . Rivaroxaban (XARELTO) 20 MG TABS tablet Take 1 tablet (20 mg total) by mouth daily with supper.  30 tablet  3  . temazepam (RESTORIL) 30 MG capsule Take 30 mg by mouth at bedtime as needed for  sleep.       No current facility-administered medications for this visit.   Family History  Problem Relation Age of Onset  . CAD Mother 5  . Diabetes Mother   . Heart disease Mother     h/o MI   History   Social History  . Marital Status: Divorced    Spouse Name: N/A    Number of Children: 2  . Years of Education: 10th grade   Occupational History  . 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 Smoking status: Former Smoker -- 0.10 packs/day for 40 years    Types: Cigarettes    Quit date: 03/26/2013  . Smokeless tobacco: Never Used     Comment: has now quit  . Alcohol Use: No  . Drug Use: No  . Sexual Activity: None   Other Topics Concern  . None   Social History Narrative   Lives at [place] alone, 2 children, divorced         Review of Systems: Constitutional: Denies fever, chills, diaphoresis, appetite change and fatigue.  Respiratory: Denies SOB, DOE, cough, chest tightness, and wheezing.  Cardiovascular: No chest pain, palpitations and leg swelling.  Gastrointestinal: No abdominal pain, nausea, vomiting, bloody stools Genitourinary: No dysuria, frequency, hematuria, or flank pain.     Objective:  Physical Exam: Filed Vitals:   07/19/13 1048  BP: 112/71  Pulse: 110  Temp: 97.4 F (36.3 C)  TempSrc: Oral  Height: 5' 4.5" (1.638 m)  Weight: 187 lb 12.8 oz (85.186 kg)  SpO2: 94%   General: Well nourished. No acute distress.  Lungs: CTA bilaterally. Heart: Irregularly irregular heart rate with tachycardia of 110; no extra sounds or murmurs  Abdomen: Non-distended, normal BS, soft, nontender; no hepatosplenomegaly  Extremities: No pedal edema. No joint swelling or tenderness. Mild tenderness on the gentle squeeze of the bilateral thigh muscles. Neurologic: Alert and oriented x3. No obvious neurologic deficits.  Assessment & Plan:  I have discussed my assessment and plan  with Dr. Josem Gardner as detailed  under problem based charting.

## 2013-07-19 NOTE — Assessment & Plan Note (Addendum)
Patient reports muscle pains over the past 2 weeks. She attributes this to Xarelto. This side effect has been reports in <5% of patients. However, I also suspect that she might be having some electrolyte imbalances since starting furosemide to, which she's been compliant.  Plan -Discussed with the patient to try topical remedies like Voltaren gel. -CMP for Hypokalemia. Magnesium level. - No NSAIDs due to Xarelto but I encouraged her to use Tylenol.

## 2013-07-19 NOTE — Assessment & Plan Note (Signed)
BP Readings from Last 3 Encounters:  07/19/13 112/71  07/03/13 118/81  06/22/13 111/77    Lab Results  Component Value Date   NA 136 06/18/2013   K 4.2 06/18/2013   CREATININE 0.52 06/18/2013    Assessment: Blood pressure control: controlled Progress toward BP goal:  at goal Comments: Not taking Cardizem  Plan: Medications:  I will switch her Cardizem to metoprolol 25 mg twice a day. She is also taking furosemide 20 mg daily when necessary. Educational resources provided: Comptroller tools provided:   Other plans: Emphasized compliance.

## 2013-07-19 NOTE — Assessment & Plan Note (Addendum)
Patient has been off her Cardizem for one week due to bilateral lower extremity edema, which is a possibility. She does not wish to continue with this medication.  Plan  - I'll switch her to metoprolol at 25 mg twice a day. This medication is on $4 list. - I emphasized to the patient that she needs to keep on her rate control medication. I discussed the complications of tachycardia with atrial fibrillation, including heart failure, and stroke. I also informed her that her symptoms of fatigue and palpitations are likely to be well controlled with metoprolol. Patient verbalizes understanding and will followup with her regular cardiologist. - She continues to take Xarelto

## 2013-07-19 NOTE — Assessment & Plan Note (Signed)
Patient noted to have hemoglobin of 8 - 9. MCV is about 105.   Plan  - vitamin B12 level folate acid level, homocystine, and Methylmalonic acid - CBC today.

## 2013-07-19 NOTE — Assessment & Plan Note (Signed)
Lab Results  Component Value Date   HGBA1C 6.0 07/19/2013   HGBA1C 9.9 04/05/2013   HGBA1C 10.2* 11/10/2012     Assessment: Diabetes control: good control (HgbA1C at goal) Progress toward A1C goal:  at goal Comments: Good A1C  Plan: Medications:  continue current medications Home glucose monitoring: Frequency:   Timing:   Instruction/counseling given: reminded to bring medications to each visit Educational resources provided: brochure;handout Self management tools provided:

## 2013-07-19 NOTE — Patient Instructions (Signed)
I recommend you continue to use your regular muscle pain creams for muscles pain  I will change you from Cardizem to Metoprolol for your atrial fibrillation  I have ordered a blood check for your and I will give you a call if there is any concern Please keep your appointment with Dr Karmen Stabs, your new doctor.  Metoprolol tablets What is this medicine? METOPROLOL (me TOE proe lole) is a beta-blocker. Beta-blockers reduce the workload on the heart and help it to beat more regularly. This medicine is used to treat high blood pressure and to prevent chest pain. It is also used to after a heart attack and to prevent an additional heart attack from occurring. This medicine may be used for other purposes; ask your health care provider or pharmacist if you have questions. What should I tell my health care provider before I take this medicine? They need to know if you have any of these conditions: -diabetes -heart or vessel disease like slow heart rate, worsening heart failure, heart block, sick sinus syndrome or Raynaud's disease -kidney disease -liver disease -lung or breathing disease, like asthma or emphysema -pheochromocytoma -thyroid disease -an unusual or allergic reaction to metoprolol, other beta-blockers, medicines, foods, dyes, or preservatives -pregnant or trying to get pregnant -breast-feeding How should I use this medicine? Take this medicine by mouth with a drink of water. Follow the directions on the prescription label. Take this medicine immediately after meals. Take your doses at regular intervals. Do not take more medicine than directed. Do not stop taking this medicine suddenly. This could lead to serious heart-related effects. Talk to your pediatrician regarding the use of this medicine in children. Special care may be needed. Overdosage: If you think you have taken too much of this medicine contact a poison control center or emergency room at once. NOTE: This medicine is only  for you. Do not share this medicine with others. What if I miss a dose? If you miss a dose, take it as soon as you can. If it is almost time for your next dose, take only that dose. Do not take double or extra doses. What may interact with this medicine? Do not take this medicine with any of the following medications: -sotalol This medicine may also interact with the following medications: -clonidine -digoxin -dobutamine -epinephrine -isoproterenol -medicine to control heart rhythm like quinidine, propafenone -medicine for depression like monoamine oxidase (MAO) inhibitors, fluoxetine, and paroxetine -medicine for high blood pressure like calcium channel blockers -reserpine This list may not describe all possible interactions. Give your health care provider a list of all the medicines, herbs, non-prescription drugs, or dietary supplements you use. Also tell them if you smoke, drink alcohol, or use illegal drugs. Some items may interact with your medicine. What should I watch for while using this medicine? Visit your doctor or health care professional for regular check ups. Contact your doctor right away if your symptoms worsen. Check your blood pressure and pulse rate regularly. Ask your health care professional what your blood pressure and pulse rate should be, and when you should contact them. You may get drowsy or dizzy. Do not drive, use machinery, or do anything that needs mental alertness until you know how this medicine affects you. Do not sit or stand up quickly, especially if you are an older patient. This reduces the risk of dizzy or fainting spells. Contact your doctor if these symptoms continue. Alcohol may interfere with the effect of this medicine. Avoid alcoholic drinks. What side  effects may I notice from receiving this medicine? Side effects that you should report to your doctor or health care professional as soon as possible: -allergic reactions like skin rash, itching or  hives -cold or numb hands or feet -depression -difficulty breathing -faint -fever with sore throat -irregular heartbeat, chest pain -rapid weight gain -swollen legs or ankles Side effects that usually do not require medical attention (report to your doctor or health care professional if they continue or are bothersome): -anxiety or nervousness -change in sex drive or performance -dry skin -headache -nightmares or trouble sleeping -short term memory loss -stomach upset or diarrhea -unusually tired This list may not describe all possible side effects. Call your doctor for medical advice about side effects. You may report side effects to FDA at 1-800-FDA-1088. Where should I keep my medicine? Keep out of the reach of children. Store at room temperature between 15 and 30 degrees C (59 and 86 degrees F). Throw away any unused medicine after the expiration date. NOTE: This sheet is a summary. It may not cover all possible information. If you have questions about this medicine, talk to your doctor, pharmacist, or health care provider.  2012, Elsevier/Gold Standard. (01/19/2008 4:11:19 PM)

## 2013-07-20 ENCOUNTER — Telehealth: Payer: Self-pay | Admitting: *Deleted

## 2013-07-20 ENCOUNTER — Encounter: Payer: Self-pay | Admitting: *Deleted

## 2013-07-20 LAB — FOLATE RBC: RBC Folate: 1261 ng/mL (ref >=366)

## 2013-07-20 NOTE — Telephone Encounter (Signed)
Ms. Anita Gardner called and relayed that one of her teeth, left front tooth near her incisor fell out and that Dr. Dow Adolph suggested she get a referral to see Dr. Kristin Bruins. Informed her that Dr. Basilio Cairo will be informed and she will receive a return call on Tuesday 07/24/13.

## 2013-07-23 NOTE — Progress Notes (Signed)
Case discussed with Dr. Zada Girt at the time of the visit. We reviewed the resident's history and exam and pertinent patient test results. I agree with the assessment, diagnosis and plan of care documented in the resident's note.  Will ask Dr. Zada Girt to discuss the mild hypomagnesemia with Anita Gardner and recommend that she begin oral Magnesium Oxide, as electrolyte shifts may explain some of her leg symptoms.

## 2013-07-24 ENCOUNTER — Other Ambulatory Visit: Payer: Self-pay | Admitting: Radiation Oncology

## 2013-07-24 ENCOUNTER — Other Ambulatory Visit: Payer: Self-pay | Admitting: Internal Medicine

## 2013-07-24 DIAGNOSIS — K0889 Other specified disorders of teeth and supporting structures: Secondary | ICD-10-CM

## 2013-07-24 MED ORDER — MAGNESIUM OXIDE 400 MG PO TABS: 400.0000 mg | ORAL_TABLET | Freq: Two times a day (BID) | ORAL | Status: AC

## 2013-07-24 NOTE — Progress Notes (Signed)
Called patient and left a voice mail on her phone. I informed her that I will send a prescription of Magnesium oxide. I encouraged to call back the clinic if she has any questions.

## 2013-07-25 ENCOUNTER — Telehealth: Payer: Self-pay | Admitting: Medical Oncology

## 2013-07-25 LAB — METHYLMALONIC ACID, SERUM: Methylmalonic Acid, Quant: 0.2 umol/L (ref <0.40)

## 2013-07-25 NOTE — Telephone Encounter (Signed)
Clarified appts

## 2013-07-26 ENCOUNTER — Telehealth: Payer: Self-pay | Admitting: *Deleted

## 2013-07-26 NOTE — Telephone Encounter (Signed)
Called Anita Gardner to inform her that Dr.Kulinski received the voicemail regarding the loss of her tooth and the need for a visit with him.  He visited Radiation Oncology today to confirm.  When asked he stated that because Anita Gardner's medicaid has elapsed, that she may have to pay the physician fee out of pocket.  At 1915 Anita Gardner was informed of this and she stated that " I just received a letter that said I have medicaid now."  Informed her that Dr. Kristin Bruins will contact her to discuss her care and that this RN will call back on Tuesday next week after returning to work.

## 2013-07-31 ENCOUNTER — Telehealth: Payer: Self-pay | Admitting: *Deleted

## 2013-07-31 NOTE — Telephone Encounter (Signed)
CALLED DR. KULINSKI' OFFICE TO SEE IF THEY HAVE SEEN THIS PATIENT YET, SPOKE WITH LISA AND SHE TELLS ME THAT DR. Kristin Bruins HASN'T SEEN THIS PATIENT YET, BUT THAT HE HAS THE PAPER WORK AND WILL BE SEEING HER .

## 2013-08-06 ENCOUNTER — Encounter: Payer: No Typology Code available for payment source | Admitting: Internal Medicine

## 2013-08-07 ENCOUNTER — Other Ambulatory Visit: Payer: Self-pay | Admitting: Radiation Therapy

## 2013-08-07 DIAGNOSIS — C7931 Secondary malignant neoplasm of brain: Secondary | ICD-10-CM

## 2013-08-08 ENCOUNTER — Telehealth (HOSPITAL_COMMUNITY): Payer: Self-pay | Admitting: Dental General Practice

## 2013-08-08 NOTE — Telephone Encounter (Signed)
Discussed dental treatment with patient. Patient does not fit scope of practice for dental medicine. Patient has dental medicaid and will follow up with other dental provider who is enrolled in the medicaid program.  djs

## 2013-08-13 ENCOUNTER — Ambulatory Visit: Payer: No Typology Code available for payment source | Admitting: Cardiology

## 2013-08-13 ENCOUNTER — Encounter: Payer: No Typology Code available for payment source | Admitting: Internal Medicine

## 2013-08-20 ENCOUNTER — Inpatient Hospital Stay: Admission: RE | Admit: 2013-08-20 | Payer: Medicaid Other | Source: Ambulatory Visit

## 2013-08-21 ENCOUNTER — Ambulatory Visit: Payer: No Typology Code available for payment source | Admitting: Radiation Oncology

## 2013-08-29 ENCOUNTER — Ambulatory Visit
Admission: RE | Admit: 2013-08-29 | Discharge: 2013-08-29 | Disposition: A | Discharge status: Home / Self Care | Payer: Medicaid Other | Source: Ambulatory Visit | Attending: Radiation Oncology | Admitting: Radiation Oncology

## 2013-08-29 DIAGNOSIS — C7931 Secondary malignant neoplasm of brain: Secondary | ICD-10-CM

## 2013-08-29 MED ORDER — GADOBENATE DIMEGLUMINE 529 MG/ML IV SOLN
18.0000 mL | Freq: Once | INTRAVENOUS | Status: AC | PRN
  Administered 2013-08-29: 18 mL via INTRAVENOUS

## 2013-08-30 ENCOUNTER — Telehealth: Payer: Self-pay | Admitting: *Deleted

## 2013-08-30 NOTE — Telephone Encounter (Signed)
Anita Gardner in Radiology gave report of MRI brian done 08/29/13 last night,r ="widespread metastatic brain, 15 mets, 10-25 ml in diameter", thnaked Anita Gardner and will forward this to Dr.Squire, printed resulted of MRI 8:59 AM'

## 2013-08-31 ENCOUNTER — Encounter: Payer: Self-pay | Admitting: Radiation Oncology

## 2013-08-31 ENCOUNTER — Ambulatory Visit
Admission: RE | Admit: 2013-08-31 | Discharge: 2013-08-31 | Disposition: A | Discharge status: Home / Self Care | Payer: Medicaid Other | Source: Ambulatory Visit | Attending: Radiation Oncology | Admitting: Radiation Oncology

## 2013-08-31 VITALS — BP 113/69 | HR 95 | Temp 98.2°F | Resp 20 | Ht 64.5 in | Wt 185.5 lb

## 2013-08-31 DIAGNOSIS — C7931 Secondary malignant neoplasm of brain: Secondary | ICD-10-CM

## 2013-08-31 NOTE — Progress Notes (Signed)
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  When I saw her in early August, I recommended PCI to prevent brain metastases but after long discussion she opted to "wait and see."  Unfortunately, her MRI from this week shows approximately 15 metastases.  I reviewed the images with the patient and her family. They are devastated.  She reports weakness and decreased sensation in her forearms, and a little more weakness in the left arm and leg than the right side.  Thighs weak bilaterally.  She is in a wheelchair today.  She has blurry vision, some dizziness. No HA, but nausea at times.  No seizures.  Chronic pain in left leg with movement, this has gone on for years.                      ALLERGIES:  is allergic to ampicillin and codeine.  Meds: Current Outpatient Prescriptions  Medication Sig Dispense Refill  . acetaminophen (TYLENOL) 650 MG CR tablet Take 1,300 mg by mouth every 8 (eight) hours as needed for pain.      . Cholecalciferol (VITAMIN D-3) 5000 UNITS TABS Take 5,000 Units by mouth daily.      . furosemide (LASIX) 20 MG tablet Take 1 tablet (20 mg total) by mouth daily as needed.  45 tablet  0  . glyBURIDE (DIABETA) 5 MG tablet Take 5 mg by mouth 2 (two) times daily with a meal.      . magnesium oxide (MAG-OX) 400 MG tablet Take 1 tablet (400 mg total) by mouth 2 (two) times daily.  20 tablet  0  . metFORMIN (GLUCOPHAGE) 1000 MG tablet Take 1,000 mg by mouth 2 (two) times daily with a meal.      . metoprolol tartrate (LOPRESSOR)  25 MG tablet Take 1 tablet (25 mg total) by mouth 2 (two) times daily.  60 tablet  1  . Multiple Vitamin (MULTIVITAMIN WITH MINERALS) TABS Take 1 tablet by mouth daily.      Marland Kitchen oxybutynin (DITROPAN) 5 MG tablet Take 5 mg by mouth 3 (three) times daily.      . prochlorperazine (COMPAZINE) 10 MG tablet Take 10 mg by mouth every 6 (six) hours as needed (for nausea).      . ranitidine (ZANTAC) 150 MG tablet Take 150 mg by mouth 2 (two) times daily.      . Rivaroxaban (XARELTO) 20 MG TABS tablet Take 1 tablet (20 mg total) by mouth daily with supper.  30 tablet  3  . temazepam (RESTORIL) 30 MG capsule Take 30 mg by mouth at bedtime as needed for sleep.      . Omega-3 Fatty Acids (FISH OIL) 1200 MG CAPS Take 1,200 mg by mouth daily.       No current facility-administered medications for this encounter.    Physical Findings: The patient is in no acute distress. Patient is alert and oriented.  height is 5' 4.5" (1.638 m) and weight is 185 lb 8 oz (84.142 kg). Her temperature is 98.2 F (36.8  C). Her blood pressure is 113/69 and her pulse is 95. Her respiration is 20. Marland Kitchen    General: Alert and oriented HEENT: Head is normocephalic. Pupils are equally round and reactive to light. Extraocular movements are intact. Visual quadrants intact. Oropharynx is clear. Musculoskeletal: slightly asymmetric strength, decreased in left arm and leg Neurologic: Cranial nerves II through XII are grossly intact. Speech is fluent. Coordination is intact. Psychiatric: Judgment and insight are intact. Affect is appropriate.  KPS = 60  Lab Findings: Lab Results  Component Value Date   WBC 5.5 07/19/2013   HGB 13.8 07/19/2013   HCT 39.3 07/19/2013   MCV 100.0 07/19/2013   PLT 207 07/19/2013    Radiographic Findings: Mr Laqueta Jean YN Contrast  08/29/2013   CLINICAL DATA:  57 year old female with lung cancer. Weakness and loss of balance. Restaging. Subsequent encounter.  BUN and creatinine were obtained on site at [facility]  Imaging at  [address]  Results:  BUN 8 mg/dL,  Creatinine 0.5 mg/dL.  EXAM: MRI HEAD WITHOUT AND WITH CONTRAST  TECHNIQUE: Multiplanar, multiecho pulse sequences of the brain and surrounding structures were obtained according to standard protocol without and with intravenous contrast  CONTRAST:  18mL MULTIHANCE GADOBENATE DIMEGLUMINE 529 MG/ML IV SOLN  COMPARISON:  02/25/2013.  FINDINGS: There are now numerous brain masses, most with combined rim and heterogeneous central enhancement. All of the larger lesions demonstrate restricted diffusion compatible with hypercellularity. Relatively mild degrees of cerebral edema occur. No significant intracranial mass effect at this time. Most of the lesions are about 15 mm diameter. The largest are up to 25 mm diameter.  Approximately 15 metastases are identified.  No superimpose restricted diffusion suggestive of acute infarct. Stable cerebral volume. Negative pituitary, cervicomedullary junction and visualized cervical spine. No ventriculomegaly. No acute intracranial hemorrhage identified. Major intracranial vascular flow voids are stable.  Visualized orbit soft tissues are within normal limits. Visualized paranasal sinuses and mastoids are clear. Visualized scalp soft tissues are within normal limits.  Visualized bone marrow signal is stable and within normal limits.  IMPRESSION: 1. Widespread metastatic disease to the brain. Approximately 15 metastases are identified ranging from 10-25 mm diameter.  2. Relatively mild cerebral edema. No significant intracranial mass effect at this time.  These results will be called to the ordering clinician or representative by the Radiologist Assistant, and communication documented in the PACS Dashboard.   Electronically Signed   By: Augusto Gamble M.D.   On: 08/29/2013 19:12    Impression/Plan:  Lovely 57 yo with small cell lung cancer, now with brain metastases.  1) I recommend Whole brain RT to 30Gy/10 fractions-  simulation on Monday 10-13.  Start RT the next day. We discussed the risks, benefits, and side effects of radiotherapy which may include hair loss, skin irritation, fatigue, cognitive decline (frank dementia is uncommon). No guarantees of treatment were given. A consent form was signed and placed in the patient's medical record. The patient is enthusiastic about proceeding with treatment. I look forward to participating in her care.  2) Refer to PT/OT for weakness.  3) Social work for anxiety, possible psychology referral? per social work's assessment. Patient requests this.  Feels like sometimes her "mind is not right."  Wants to be assessed by psychologist if social worker can refer her to someone convenient.  I spent 25 minutes face to face with the patient and more than 50% of that time was spent in counseling and/or coordination of care. _____________________________________  Sandralee Tarkington, MD 

## 2013-09-02 ENCOUNTER — Encounter: Payer: Self-pay | Admitting: Radiation Oncology

## 2013-09-02 DIAGNOSIS — C7931 Secondary malignant neoplasm of brain: Secondary | ICD-10-CM | POA: Insufficient documentation

## 2013-09-03 ENCOUNTER — Emergency Department (HOSPITAL_COMMUNITY): Payer: Medicaid Other

## 2013-09-03 ENCOUNTER — Other Ambulatory Visit: Payer: Self-pay | Admitting: Radiation Oncology

## 2013-09-03 ENCOUNTER — Ambulatory Visit: Payer: Medicaid Other | Admitting: Radiation Oncology

## 2013-09-03 ENCOUNTER — Inpatient Hospital Stay (HOSPITAL_COMMUNITY)
Admission: EM | Admit: 2013-09-03 | Discharge: 2013-09-14 | DRG: 054 | Disposition: A | Discharge status: Hospice | Payer: Medicaid Other | Attending: Internal Medicine | Admitting: Internal Medicine

## 2013-09-03 ENCOUNTER — Encounter (HOSPITAL_COMMUNITY): Payer: Self-pay | Admitting: Emergency Medicine

## 2013-09-03 DIAGNOSIS — G8929 Other chronic pain: Secondary | ICD-10-CM

## 2013-09-03 DIAGNOSIS — C3490 Malignant neoplasm of unspecified part of unspecified bronchus or lung: Secondary | ICD-10-CM

## 2013-09-03 DIAGNOSIS — F29 Unspecified psychosis not due to a substance or known physiological condition: Secondary | ICD-10-CM | POA: Diagnosis present

## 2013-09-03 DIAGNOSIS — Z923 Personal history of irradiation: Secondary | ICD-10-CM

## 2013-09-03 DIAGNOSIS — IMO0001 Reserved for inherently not codable concepts without codable children: Secondary | ICD-10-CM | POA: Insufficient documentation

## 2013-09-03 DIAGNOSIS — K219 Gastro-esophageal reflux disease without esophagitis: Secondary | ICD-10-CM | POA: Diagnosis present

## 2013-09-03 DIAGNOSIS — E669 Obesity, unspecified: Secondary | ICD-10-CM

## 2013-09-03 DIAGNOSIS — C3492 Malignant neoplasm of unspecified part of left bronchus or lung: Secondary | ICD-10-CM

## 2013-09-03 DIAGNOSIS — G936 Cerebral edema: Secondary | ICD-10-CM | POA: Diagnosis present

## 2013-09-03 DIAGNOSIS — R600 Localized edema: Secondary | ICD-10-CM

## 2013-09-03 DIAGNOSIS — I4891 Unspecified atrial fibrillation: Secondary | ICD-10-CM

## 2013-09-03 DIAGNOSIS — R5383 Other fatigue: Secondary | ICD-10-CM

## 2013-09-03 DIAGNOSIS — J4489 Other specified chronic obstructive pulmonary disease: Secondary | ICD-10-CM | POA: Diagnosis present

## 2013-09-03 DIAGNOSIS — C7931 Secondary malignant neoplasm of brain: Secondary | ICD-10-CM | POA: Insufficient documentation

## 2013-09-03 DIAGNOSIS — W07XXXA Fall from chair, initial encounter: Secondary | ICD-10-CM | POA: Diagnosis present

## 2013-09-03 DIAGNOSIS — Z66 Do not resuscitate: Secondary | ICD-10-CM | POA: Diagnosis present

## 2013-09-03 DIAGNOSIS — Z0000 Encounter for general adult medical examination without abnormal findings: Secondary | ICD-10-CM

## 2013-09-03 DIAGNOSIS — Y92009 Unspecified place in unspecified non-institutional (private) residence as the place of occurrence of the external cause: Secondary | ICD-10-CM

## 2013-09-03 DIAGNOSIS — D539 Nutritional anemia, unspecified: Secondary | ICD-10-CM

## 2013-09-03 DIAGNOSIS — F17200 Nicotine dependence, unspecified, uncomplicated: Secondary | ICD-10-CM | POA: Insufficient documentation

## 2013-09-03 DIAGNOSIS — I10 Essential (primary) hypertension: Secondary | ICD-10-CM | POA: Insufficient documentation

## 2013-09-03 DIAGNOSIS — K029 Dental caries, unspecified: Secondary | ICD-10-CM

## 2013-09-03 DIAGNOSIS — F319 Bipolar disorder, unspecified: Secondary | ICD-10-CM | POA: Diagnosis present

## 2013-09-03 DIAGNOSIS — Z7901 Long term (current) use of anticoagulants: Secondary | ICD-10-CM

## 2013-09-03 DIAGNOSIS — N3941 Urge incontinence: Secondary | ICD-10-CM

## 2013-09-03 DIAGNOSIS — Z9221 Personal history of antineoplastic chemotherapy: Secondary | ICD-10-CM

## 2013-09-03 DIAGNOSIS — R32 Unspecified urinary incontinence: Secondary | ICD-10-CM | POA: Diagnosis present

## 2013-09-03 DIAGNOSIS — E1142 Type 2 diabetes mellitus with diabetic polyneuropathy: Secondary | ICD-10-CM | POA: Diagnosis present

## 2013-09-03 DIAGNOSIS — S01512A Laceration without foreign body of oral cavity, initial encounter: Secondary | ICD-10-CM

## 2013-09-03 DIAGNOSIS — R911 Solitary pulmonary nodule: Secondary | ICD-10-CM

## 2013-09-03 DIAGNOSIS — K1370 Unspecified lesions of oral mucosa: Secondary | ICD-10-CM | POA: Insufficient documentation

## 2013-09-03 DIAGNOSIS — G4700 Insomnia, unspecified: Secondary | ICD-10-CM

## 2013-09-03 DIAGNOSIS — M791 Myalgia, unspecified site: Secondary | ICD-10-CM

## 2013-09-03 DIAGNOSIS — E785 Hyperlipidemia, unspecified: Secondary | ICD-10-CM

## 2013-09-03 DIAGNOSIS — J449 Chronic obstructive pulmonary disease, unspecified: Secondary | ICD-10-CM | POA: Diagnosis present

## 2013-09-03 DIAGNOSIS — Z515 Encounter for palliative care: Secondary | ICD-10-CM

## 2013-09-03 DIAGNOSIS — K149 Disease of tongue, unspecified: Secondary | ICD-10-CM | POA: Diagnosis present

## 2013-09-03 DIAGNOSIS — E1149 Type 2 diabetes mellitus with other diabetic neurological complication: Secondary | ICD-10-CM | POA: Diagnosis present

## 2013-09-03 HISTORY — DX: Secondary malignant neoplasm of brain: C79.31

## 2013-09-03 HISTORY — DX: Personal history of other medical treatment: Z92.89

## 2013-09-03 HISTORY — DX: Pure hypercholesterolemia, unspecified: E78.00

## 2013-09-03 HISTORY — DX: Unspecified urinary incontinence: R32

## 2013-09-03 HISTORY — DX: Anemia, unspecified: D64.9

## 2013-09-03 LAB — URINALYSIS, ROUTINE W REFLEX MICROSCOPIC
Ketones, ur: >80 mg/dL — AB
Leukocytes, UA: NEGATIVE
Nitrite: NEGATIVE
Protein, ur: 100 mg/dL — AB
Urobilinogen, UA: 1 mg/dL (ref 0.0–1.0)
pH: 6 (ref 5.0–8.0)

## 2013-09-03 LAB — CBC
MCV: 91.8 fL (ref 78.0–100.0)
Platelets: 225 10*3/uL (ref 150–400)
RBC: 4.86 MIL/uL (ref 3.87–5.11)
RDW: 12.6 % (ref 11.5–15.5)
WBC: 13.2 10*3/uL — ABNORMAL HIGH (ref 4.0–10.5)

## 2013-09-03 LAB — COMPREHENSIVE METABOLIC PANEL
AST: 24 U/L (ref 0–37)
Albumin: 3.9 g/dL (ref 3.5–5.2)
CO2: 23 mEq/L (ref 19–32)
Calcium: 10 mg/dL (ref 8.4–10.5)
Creatinine, Ser: 0.6 mg/dL (ref 0.50–1.10)
GFR calc Af Amer: >90 mL/min (ref >90)
GFR calc non Af Amer: >90 mL/min (ref >90)
Sodium: 133 mEq/L — ABNORMAL LOW (ref 135–145)
Total Bilirubin: 1.2 mg/dL (ref 0.3–1.2)
Total Protein: 7.2 g/dL (ref 6.0–8.3)

## 2013-09-03 LAB — URINE MICROSCOPIC-ADD ON

## 2013-09-03 LAB — GLUCOSE, CAPILLARY
Glucose-Capillary: 325 mg/dL — ABNORMAL HIGH (ref 70–99)
Glucose-Capillary: 268 mg/dL — ABNORMAL HIGH (ref 70–99)

## 2013-09-03 LAB — POCT I-STAT, CHEM 8
BUN: 27 mg/dL — ABNORMAL HIGH (ref 6–23)
Creatinine, Ser: 0.9 mg/dL (ref 0.50–1.10)
Potassium: 4.3 mEq/L (ref 3.5–5.1)
Sodium: 135 mEq/L (ref 135–145)

## 2013-09-03 LAB — PROTIME-INR: INR: 1.13 (ref 0.00–1.49)

## 2013-09-03 MED ORDER — FAMOTIDINE 20 MG PO TABS
20.0000 mg | ORAL_TABLET | Freq: Two times a day (BID) | ORAL | Status: DC
  Administered 2013-09-03 – 2013-09-14 (×22): 20 mg via ORAL
  Filled 2013-09-03 (×24): qty 1

## 2013-09-03 MED ORDER — METOPROLOL TARTRATE 25 MG PO TABS
25.0000 mg | ORAL_TABLET | Freq: Two times a day (BID) | ORAL | Status: DC
  Administered 2013-09-03 – 2013-09-14 (×22): 25 mg via ORAL
  Filled 2013-09-03 (×24): qty 1

## 2013-09-03 MED ORDER — INSULIN ASPART 100 UNIT/ML ~~LOC~~ SOLN
0.0000 [IU] | Freq: Three times a day (TID) | SUBCUTANEOUS | Status: DC
  Administered 2013-09-04: 8 [IU] via SUBCUTANEOUS
  Administered 2013-09-04: 15 [IU] via SUBCUTANEOUS
  Administered 2013-09-04: 11 [IU] via SUBCUTANEOUS
  Administered 2013-09-05: 5 [IU] via SUBCUTANEOUS
  Administered 2013-09-05 (×2): 11 [IU] via SUBCUTANEOUS
  Administered 2013-09-06: 13:00:00 15 [IU] via SUBCUTANEOUS
  Administered 2013-09-06: 5 [IU] via SUBCUTANEOUS
  Administered 2013-09-06 – 2013-09-07 (×2): 11 [IU] via SUBCUTANEOUS
  Administered 2013-09-07: 17:00:00 5 [IU] via SUBCUTANEOUS
  Administered 2013-09-07: 8 [IU] via SUBCUTANEOUS
  Administered 2013-09-08: 17:00:00 5 [IU] via SUBCUTANEOUS
  Administered 2013-09-08 (×2): 11 [IU] via SUBCUTANEOUS
  Administered 2013-09-09: 8 [IU] via SUBCUTANEOUS
  Administered 2013-09-09: 10:00:00 2 [IU] via SUBCUTANEOUS
  Administered 2013-09-09 – 2013-09-10 (×2): 11 [IU] via SUBCUTANEOUS
  Administered 2013-09-10: 08:00:00 5 [IU] via SUBCUTANEOUS
  Administered 2013-09-10: 11 [IU] via SUBCUTANEOUS
  Administered 2013-09-11: 08:00:00 5 [IU] via SUBCUTANEOUS
  Administered 2013-09-11: 17:00:00 2 [IU] via SUBCUTANEOUS
  Administered 2013-09-11: 12:00:00 8 [IU] via SUBCUTANEOUS
  Administered 2013-09-12 (×2): 3 [IU] via SUBCUTANEOUS
  Administered 2013-09-12 – 2013-09-13 (×2): 8 [IU] via SUBCUTANEOUS
  Administered 2013-09-13: 5 [IU] via SUBCUTANEOUS
  Administered 2013-09-13: 18:00:00 15 [IU] via SUBCUTANEOUS
  Administered 2013-09-14: 3 [IU] via SUBCUTANEOUS
  Administered 2013-09-14: 09:00:00 2 [IU] via SUBCUTANEOUS

## 2013-09-03 MED ORDER — IOHEXOL 300 MG/ML  SOLN
80.0000 mL | Freq: Once | INTRAMUSCULAR | Status: AC | PRN
  Administered 2013-09-03: 80 mL via INTRAVENOUS

## 2013-09-03 MED ORDER — MAGNESIUM OXIDE 400 (241.3 MG) MG PO TABS
400.0000 mg | ORAL_TABLET | Freq: Two times a day (BID) | ORAL | Status: DC
  Administered 2013-09-03 – 2013-09-14 (×22): 400 mg via ORAL
  Filled 2013-09-03 (×23): qty 1

## 2013-09-03 MED ORDER — DEXAMETHASONE SODIUM PHOSPHATE 10 MG/ML IJ SOLN
10.0000 mg | Freq: Once | INTRAMUSCULAR | Status: AC
  Administered 2013-09-03: 10 mg via INTRAVENOUS
  Filled 2013-09-03: qty 1

## 2013-09-03 MED ORDER — RIVAROXABAN 20 MG PO TABS
20.0000 mg | ORAL_TABLET | Freq: Every day | ORAL | Status: DC
  Administered 2013-09-03 – 2013-09-13 (×11): 20 mg via ORAL
  Filled 2013-09-03 (×12): qty 1

## 2013-09-03 MED ORDER — SODIUM CHLORIDE 0.9 % IJ SOLN
3.0000 mL | Freq: Two times a day (BID) | INTRAMUSCULAR | Status: DC
  Administered 2013-09-05 – 2013-09-14 (×17): 3 mL via INTRAVENOUS

## 2013-09-03 MED ORDER — ENOXAPARIN SODIUM 40 MG/0.4ML ~~LOC~~ SOLN: 40.0000 mg | SUBCUTANEOUS | Status: DC

## 2013-09-03 MED ORDER — INSULIN ASPART 100 UNIT/ML ~~LOC~~ SOLN
0.0000 [IU] | Freq: Every day | SUBCUTANEOUS | Status: DC
  Administered 2013-09-03: 3 [IU] via SUBCUTANEOUS
  Administered 2013-09-04: 2 [IU] via SUBCUTANEOUS
  Administered 2013-09-05: 4 [IU] via SUBCUTANEOUS
  Administered 2013-09-06: 3 [IU] via SUBCUTANEOUS
  Administered 2013-09-07: 2 [IU] via SUBCUTANEOUS
  Administered 2013-09-09: 22:00:00 3 [IU] via SUBCUTANEOUS
  Administered 2013-09-10: 23:00:00 2 [IU] via SUBCUTANEOUS
  Administered 2013-09-13: 23:00:00 3 [IU] via SUBCUTANEOUS

## 2013-09-03 MED ORDER — METOPROLOL TARTRATE 1 MG/ML IV SOLN
5.0000 mg | INTRAVENOUS | Status: DC | PRN
  Administered 2013-09-03 (×3): 5 mg via INTRAVENOUS
  Filled 2013-09-03 (×3): qty 5

## 2013-09-03 MED ORDER — DEXAMETHASONE SODIUM PHOSPHATE 10 MG/ML IJ SOLN
10.0000 mg | INTRAMUSCULAR | Status: DC
  Administered 2013-09-04 – 2013-09-12 (×9): 10 mg via INTRAVENOUS
  Filled 2013-09-03 (×10): qty 1

## 2013-09-03 MED ORDER — DILTIAZEM HCL 25 MG/5ML IV SOLN
5.0000 mg | Freq: Four times a day (QID) | INTRAVENOUS | Status: DC | PRN
  Filled 2013-09-03: qty 5

## 2013-09-03 MED ORDER — DILTIAZEM HCL 25 MG/5ML IV SOLN
5.0000 mg | Freq: Four times a day (QID) | INTRAVENOUS | Status: DC | PRN
  Administered 2013-09-04 (×3): 5 mg via INTRAVENOUS
  Filled 2013-09-03 (×3): qty 5

## 2013-09-03 MED ORDER — NICOTINE 14 MG/24HR TD PT24
14.0000 mg | MEDICATED_PATCH | Freq: Every day | TRANSDERMAL | Status: DC
  Filled 2013-09-03 (×4): qty 1

## 2013-09-03 MED ORDER — SODIUM CHLORIDE 0.9 % IV SOLN
INTRAVENOUS | Status: DC
  Administered 2013-09-03: via INTRAVENOUS

## 2013-09-03 NOTE — ED Provider Notes (Signed)
CSN: [id number]     Arrival date & time 09/03/13  1601 History   First MD Initiated Contact with Patient 09/03/13 1614     Chief Complaint  Patient presents with  . Weakness  . Fall    HPI  Patient with history of metastatic lung cancer. Status post 4 sessions chest radiation therapy. Just finished session 5 IV chemotherapy. 08/07/2013  a new diagnosis was made of multiple brain metastases. MRI dated 10/8  shows cerebral edema and multiple metastases. No signs of herniation radiographically. Scheduled for simulation for brain Rtx.   Her mother, and brother check on her frequently. Today she was at home. She ambulates short distances. She was attempting to get into a chair when she slipped on the floor. She spent an hour to 2 hours on the floor unable to get herself up due to weakness. She was incontinent of stool urine and stool. Her mother came by to check on her and found her on the floor. EMS was summoned and she was transferred here.  Past Medical History  Diagnosis Date  . Chronic pain of left knee     secondary to arthritis  . Hypertension   . GERD (gastroesophageal reflux disease)   . Diabetes mellitus type 2, uncontrolled, without complications DX: 2000    on oral medications only  . Constipation   . Anxiety   . Depression   . Panic attacks   . Bipolar 1 disorder   . COPD (chronic obstructive pulmonary disease)   . 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 Tyrone Sage (CVTS), Dr. Gwenyth Bouillon (Onc) // LLL mass noted 11/2012// PET scan 12/2012 - mediastinal + left infrahilar nodal metastasis AND low left mediastinum or posterior left pericardium is hypermetabolic likely nodal metastasis // Stage IIIA, [id number] at diagnosis // Bronchoscopy 01/2013 confirmed small cell ca. // Rad tx 04-06/14 // chemo started 04/14  . Status post chemotherapy 03/01/2013-5/22/2014concurrent with Radiation Therapy    carboplatin for AUC of 5 on day 1 and etoposide 120 mg/M2 days 1, 2 and 3 with  Neulasta support on day 4. The patient is status post 4 cycles. This is concurrent with radiation.  . S/P radiation therapy 03/01/2013-04/12/2013    Left Lung and Nodes/ 62 Gy in 31 fractions  . Diabetic peripheral neuropathy   . Atrial fibrillation    Past Surgical History  Procedure Laterality Date  . Left leg surgery      as a child  . Video bronchoscopy with endobronchial ultrasound N/A 02/14/2013    Procedure: VIDEO BRONCHOSCOPY WITH ENDOBRONCHIAL ULTRASOUND;  Surgeon: Delight Ovens, MD;  Location: [facility] OR;  Service: Thoracic;  Laterality: N/A;   Family History  Problem Relation Age of Onset  . CAD Mother 73  . Diabetes Mother   . Heart disease Mother     h/o MI   History  Substance Use Topics  . Smoking status: Former Smoker -- 0.10 packs/day for 40 years    Types: Cigarettes    Quit date: 03/26/2013  . Smokeless tobacco: Never Used     Comment: has now quit  . Alcohol Use: No   OB History   Grav Para Term Preterm Abortions TAB SAB Ect Mult Living                 Review of Systems  Constitutional: Positive for fatigue. Negative for fever, chills, diaphoresis and appetite change.  HENT: Negative for mouth sores, sore throat and trouble swallowing.  Eyes: Negative for visual disturbance.  Respiratory: Negative for cough, chest tightness, shortness of breath and wheezing.   Cardiovascular: Negative for chest pain.  Gastrointestinal: Negative for nausea, vomiting, abdominal pain, diarrhea and abdominal distention.  Endocrine: Negative for polydipsia, polyphagia and polyuria.  Genitourinary: Negative for dysuria, frequency and hematuria.  Musculoskeletal: Negative for gait problem.  Skin: Negative for color change, pallor and rash.  Neurological: Positive for weakness. Negative for dizziness, syncope, light-headedness and headaches.       She states her right leg has been weak "since I was a child"  Hematological: Does not bruise/bleed easily.  Psychiatric/Behavioral:  Negative for behavioral problems and confusion.    Allergies  Ampicillin and Codeine  Home Medications   Current Outpatient Rx  Name  Route  Sig  Dispense  Refill  . acetaminophen (TYLENOL) 650 MG CR tablet   Oral   Take 1,300 mg by mouth every 8 (eight) hours as needed for pain.         . Cholecalciferol (VITAMIN D-3) 5000 UNITS TABS   Oral   Take 5,000 Units by mouth daily.         . furosemide (LASIX) 20 MG tablet   Oral   Take 1 tablet (20 mg total) by mouth daily as needed.   45 tablet   0   . glyBURIDE (DIABETA) 5 MG tablet   Oral   Take 5 mg by mouth 2 (two) times daily with a meal.         . magnesium oxide (MAG-OX) 400 MG tablet   Oral   Take 1 tablet (400 mg total) by mouth 2 (two) times daily.   20 tablet   0   . metFORMIN (GLUCOPHAGE) 1000 MG tablet   Oral   Take 1,000 mg by mouth 2 (two) times daily with a meal.         . metoprolol tartrate (LOPRESSOR) 25 MG tablet   Oral   Take 1 tablet (25 mg total) by mouth 2 (two) times daily.   60 tablet   1   . Multiple Vitamin (MULTIVITAMIN WITH MINERALS) TABS   Oral   Take 1 tablet by mouth daily.         . Omega-3 Fatty Acids (FISH OIL) 1200 MG CAPS   Oral   Take 1,200 mg by mouth daily.         Marland Kitchen oxybutynin (DITROPAN) 5 MG tablet   Oral   Take 5 mg by mouth 3 (three) times daily.         . ranitidine (ZANTAC) 150 MG tablet   Oral   Take 150 mg by mouth 2 (two) times daily.         . Rivaroxaban (XARELTO) 20 MG TABS tablet   Oral   Take 1 tablet (20 mg total) by mouth daily with supper.   30 tablet   3   . prochlorperazine (COMPAZINE) 10 MG tablet   Oral   Take 10 mg by mouth every 6 (six) hours as needed (for nausea).          BP 124/78  Pulse 101  Temp(Src) 98.3 F (36.8 C) (Oral)  Resp 30  SpO2 95% Physical Exam  Constitutional: She is oriented to person, place, and time. She appears well-developed and well-nourished. No distress.  Slow but accurate responses.  She is awake alert.  HENT:  Head: Normocephalic.  Atraumatic  Eyes: Conjunctivae are normal. Pupils are equal, round, and reactive to light.  No scleral icterus.  3 mm reactive symmetric  Neck: Normal range of motion. Neck supple. No thyromegaly present.  Cardiovascular: An irregularly irregular rhythm present. Tachycardia present.  Exam reveals no gallop and no friction rub.   No murmur heard. A. fib with RVRon  the monitor.  Pulmonary/Chest: Effort normal and breath sounds normal. No respiratory distress. She has no wheezes. She has no rales.  Abdominal: Soft. Bowel sounds are normal. She exhibits no distension. There is no tenderness. There is no rebound.  Neurological: She is alert and oriented to person, place, and time.  Weakness 3/5 left lower extremity.  Skin: Skin is warm and dry. No rash noted.  Psychiatric: She has a normal mood and affect. Her behavior is normal.    ED Course  Procedures (including critical care time) Labs Review Labs Reviewed  CBC - Abnormal; Notable for the following:    WBC 13.2 (*)    Hemoglobin 16.1 (*)    MCHC 36.1 (*)    All other components within normal limits  GLUCOSE, CAPILLARY - Abnormal; Notable for the following:    Glucose-Capillary 325 (*)    All other components within normal limits  COMPREHENSIVE METABOLIC PANEL - Abnormal; Notable for the following:    Sodium 133 (*)    Glucose, Bld 330 (*)    All other components within normal limits  POCT I-STAT, CHEM 8 - Abnormal; Notable for the following:    BUN 27 (*)    Glucose, Bld 323 (*)    Hemoglobin 17.0 (*)    HCT 50.0 (*)    All other components within normal limits  URINE CULTURE  PROTIME-INR  URINALYSIS, ROUTINE W REFLEX MICROSCOPIC   Imaging Review Ct Head W Wo Contrast  09/03/2013   CLINICAL DATA:  Lung cancer. Documented metastases to the brain. Found on floor unresponsive today.  EXAM: CT HEAD WITHOUT AND WITH CONTRAST  TECHNIQUE: Contiguous axial images were obtained  from the base of the skull through the vertex without and with intravenous contrast  CONTRAST:  80mL OMNIPAQUE IOHEXOL 300 MG/ML  SOLN  COMPARISON:  MR brain 08/29/2013.  FINDINGS: Noncontrast exam demonstrates no midline shift or hydrocephalus. Areas of abnormal vasogenic edema are seen throughout both cerebral hemispheres, as well as the cerebellar vermis. Post infusion, abnormal enhancement of the previously demonstrated multiple intracranial metastases is noted. Based on long axis measurement of index lesions in the right temporal lobe (21 mm) and left frontal lobe (17 mm), the lesions appear roughly stable.  No intracranial hemorrhage in this patient who is anticoagulated. No skull fracture is evident. No sinus or mastoid disease.  IMPRESSION: Multiple intracranial metastatic lesions appear unchanged from 08/29/2013 when technique differences are considered. No acute findings to suggest intracranial hemorrhage. No visible skull fracture.   Electronically Signed   By: Davonna Belling M.D.   On: 09/03/2013 18:05    EKG Interpretation     Ventricular Rate:  139 PR Interval:    QRS Duration: 85 QT Interval:  303 QTC Calculation: 461 R Axis:   -91 Text Interpretation:  A Fib with RVR A fib present 05/2013          Atrial fibrillation with RVR.  A fib present 06-18-13  MDM   1. Brain metastasis    CT scan shows continued presence of multiple brain metastases. Also noted is vasogenic edema surrounding several of these areas of metastasis. Patient given 10 mg IV Decadron. Her heart rate is improved to 101 after 3  doses IV metoprolol.. Discussed at length with the patient's son and daughter who arrived several hours and her ED course. This care was discussed with the internal medicine service for the patient's primary care is followed. Plan will be admission to a telemetry bed. Transferred to [place] under the care of her primary care physicians    Roney Marion, MD 09/03/13 Windell Moment

## 2013-09-03 NOTE — ED Notes (Signed)
Patient in CT

## 2013-09-03 NOTE — ED Notes (Signed)
Bed: WA20 Expected date:  Expected time:  Means of arrival:  Comments: ems- 57 yo F, weakness, lung and brain cancer, covered in urine and feces

## 2013-09-03 NOTE — H&P (Signed)
Date: 09/03/2013               Patient Name:  Anita Gardner MRN: [id number]  DOB: [birth year] Age / Sex: 57 y.o., female   PCP: Windell Hummingbird, MD         Medical Service: Internal Medicine Teaching Service         Attending Physician: Dr. Kem Kays    First Contact: Dr. Mariea Clonts Pager: [phone]  Second Contact: Dr. Virgina Organ Pager: [phone]       After Hours (After 5p/  First Contact Pager: [phone]  weekends / holidays): Second Contact Pager: [phone]   Chief Complaint: fall  History of Present Illness:  Anita Gardner is a 57 year old woman with history of small cell lung cancer (Stage IIIA, T1bN2MX at diagnosis s/p radiation and chemo (carboplatin, etoposide, neulasta x 5 cycles; finished on 06/20/13) with multiple brain mets seen on 10/8 MRI), atrial fibrillation (on metoprolol and Xarelto), DM2, HTN, COPD who presented to ED after a fall.   Patient states that this morning 10/13, she was sitting in her recliner when she slid off of it and did not have the strength to pull herself back up.  She was on the floor for a couple of hours before her mother came to check on her and found her lying there incontinent to urine and feces.  Patient states she did not lose consciousness or hit or head.  No pain at present.   On ROS, decreased appetite but no difficulty swallowing; occasional nausea but no vomiting. Denies fever, chest pain, palpitations, shortness of breath, cough, abdominal pain, diarrhea.   She is supposed to take metoprolol 25 mg BID at home for rate control of her atrial fibrillation.  She manages her own medications and does not think she has been taking that one.   She did recently start smoking again after hearing news about her multiple brain metastases discovered on MRI brain on 08/29/13, unclear how much.   In ED, atient given IV Decadron 10 mg for vasogenic brain edema as well as IV metoprolol 5 mg x 3 doses with improvement to HR 101.   Meds: Current  Facility-Administered Medications  Medication Dose Route Frequency Provider Last Rate Last Dose  . [START ON 09/04/2013] dexamethasone (DECADRON) injection 10 mg  10 mg Intravenous Q24H Judie Bonus, MD      . diltiazem (CARDIZEM) injection 5 mg  5 mg Intravenous Q6H PRN Judie Bonus, MD      . famotidine (PEPCID) tablet 20 mg  20 mg Oral BID Judie Bonus, MD   20 mg at 09/03/13 2334  . [START ON 09/04/2013] insulin aspart (novoLOG) injection 0-15 Units  0-15 Units Subcutaneous TID WC Judie Bonus, MD      . insulin aspart (novoLOG) injection 0-5 Units  0-5 Units Subcutaneous QHS Judie Bonus, MD   3 Units at 09/03/13 2334  . magnesium oxide (MAG-OX) tablet 400 mg  400 mg Oral BID Judie Bonus, MD   400 mg at 09/03/13 2334  . metoprolol tartrate (LOPRESSOR) tablet 25 mg  25 mg Oral BID Judie Bonus, MD      . Melene Muller ON 09/04/2013] nicotine (NICODERM CQ - dosed in mg/24 hours) patch 14 mg  14 mg Transdermal Daily Judie Bonus, MD      . Rivaroxaban Carlena Hurl) tablet 20 mg  20 mg Oral Q supper Judie Bonus, MD   20 mg at 09/03/13 2334  .  sodium chloride 0.9 % injection 3 mL  3 mL Intravenous Q12H Judie Bonus, MD      home meds- metoprolol 25 mg BID, rivaroxaban, glyburide, metformin, oxybutynin, ranitidine, prochlorperazine  Allergies: Allergies as of 09/03/2013 - Review Complete 09/03/2013  Allergen Reaction Noted  . Ampicillin Hives 11/10/2012  . Codeine Nausea And Vomiting 11/29/2012   Past Medical History  Diagnosis Date  . Chronic pain of left knee     secondary to arthritis  . Hypertension   . GERD (gastroesophageal reflux disease)   . Constipation   . Anxiety   . Depression   . Panic attacks   . Bipolar 1 disorder   . COPD (chronic obstructive pulmonary disease)   . Status post chemotherapy 03/01/2013-5/22/2014concurrent with Radiation Therapy    carboplatin for AUC of 5 on day 1 and etoposide 120 mg/M2 days 1, 2 and 3  with Neulasta support on day 4. The patient is status post 4 cycles. This is concurrent with radiation.  . S/P radiation therapy 03/01/2013-04/12/2013    Left Lung and Nodes/ 62 Gy in 31 fractions  . High cholesterol   . Atrial fibrillation   . Diabetes mellitus type 2, uncontrolled, without complications DX: 2000    on oral medications only  . Diabetic peripheral neuropathy   . Anemia   . History of blood transfusion     "twice w/cancer treatments" (09/03/2013)  . Incontinence of urine   . 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 Tyrone Sage (CVTS), Dr. Gwenyth Bouillon (Onc) // LLL mass noted 11/2012// PET scan 12/2012 - mediastinal + left infrahilar nodal metastasis AND low left mediastinum or posterior left pericardium is hypermetabolic likely nodal metastasis // Stage IIIA, T1bN2MX at diagnosis // Bronchoscopy 01/2013 confirmed small cell ca. // Rad tx 04-06/14 // chemo started 04/14  . Brain metastases     /notes 09/03/2013   Past Surgical History  Procedure Laterality Date  . Skin graft Left     as a child; "bruise that never went away;Dr. Nonie Hoyer did OR; skin graft to leg" (09/03/2013)   . Video bronchoscopy with endobronchial ultrasound N/A 02/14/2013    Procedure: VIDEO BRONCHOSCOPY WITH ENDOBRONCHIAL ULTRASOUND;  Surgeon: Delight Ovens, MD;  Location: [facility] OR;  Service: Thoracic;  Laterality: N/A;   Family History  Problem Relation Age of Onset  . CAD Mother 75  . Diabetes Mother   . Heart disease Mother     h/o MI   History   Social History  . Marital Status: Divorced    Spouse Name: N/A    Number of Children: 2  . Years of Education: 10th grade   Occupational History  . 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 Smoking status: Current Every Day Smoker -- 0.10 packs/day for 40 years    Types: Cigarettes  . Smokeless tobacco: Never Used  . Alcohol Use: Yes     Comment: 09/03/2013 "used to drink; haven't  in a long long time"  . Drug Use: No  . Sexual Activity: No   Other Topics Concern  . Not on file   Social History Narrative   Lives at [place] alone, 2 children, divorced        Now smoking again.   Review of Systems: Review of Systems  Constitutional: Negative for fever, chills and weight loss.  Eyes: Positive for blurred vision.  Respiratory: Negative for cough, shortness  of breath and wheezing.   Cardiovascular: Negative for chest pain, palpitations, orthopnea and leg swelling.  Gastrointestinal: Positive for nausea. Negative for vomiting, abdominal pain, diarrhea, constipation and blood in stool.  Genitourinary: Negative for dysuria.  Musculoskeletal: Positive for falls. Negative for back pain.  Neurological: Positive for weakness. Negative for dizziness, tingling, tremors, focal weakness, seizures, loss of consciousness and headaches.    Physical Exam: Blood pressure 127/90, pulse 116, temperature 98.6 F (37 C), temperature source Oral, resp. rate 24, height 5' 4.5" (1.638 m), weight 175 lb 14.4 oz (79.788 kg), SpO2 95.00%. on RA General: alert, cooperative, and in no apparent distress though pt seemed somewhat dyspneic HEENT: NCAT, lesion on right side of tongue, vision grossly intact, oropharynx clear and non-erythematous  Neck: supple, no lymphadenopathy Lungs: diffuse crackles, increased work of respiration, no wheezes, ronchi Heart: irregularly irregular, no murmurs, gallops, or rubs Abdomen: soft, non-tender, non-distended, normal bowel sounds Extremities: trace pitting edema of bilateral lower extremities, s/p skin graft of anterior left lower leg, 2+ DP pulses bilaterally, no cyanosis, clubbing Neurologic: alert & oriented X3, cranial nerves II-XII intact, strength grossly intact, sensation intact to light touch, slurred speech?  Lab results: Basic Metabolic Panel:  Recent Labs  40/98/11 1652 09/03/13 1700  NA 133* 135  K 3.7 4.3  CL 97 100  CO2 23  --    GLUCOSE 330* 323*  BUN 20 27*  CREATININE 0.60 0.90  CALCIUM 10.0  --    Liver Function Tests:  Recent Labs  09/03/13 1652  AST 24  ALT 16  ALKPHOS 64  BILITOT 1.2  PROT 7.2  ALBUMIN 3.9   CBC:  Recent Labs  09/03/13 1645 09/03/13 1700  WBC 13.2*  --   HGB 16.1* 17.0*  HCT 44.6 50.0*  MCV 91.8  --   PLT 225  --    CBG:  Recent Labs  09/03/13 1623 09/03/13 2129  GLUCAP 325* 268*   Coagulation:  Recent Labs  09/03/13 1652  LABPROT 14.3  INR 1.13   Urinalysis:  Recent Labs  09/03/13 1728  COLORURINE AMBER*  LABSPEC 1.038*  PHURINE 6.0  GLUCOSEU >1000*  HGBUR TRACE*  BILIRUBINUR MODERATE*  KETONESUR >80*  PROTEINUR 100*  UROBILINOGEN 1.0  NITRITE NEGATIVE  LEUKOCYTESUR NEGATIVE    Imaging results:  Ct Head W Wo Contrast  09/03/2013   CLINICAL DATA:  Lung cancer. Documented metastases to the brain. Found on floor unresponsive today.  EXAM: CT HEAD WITHOUT AND WITH CONTRAST  TECHNIQUE: Contiguous axial images were obtained from the base of the skull through the vertex without and with intravenous contrast  CONTRAST:  80mL OMNIPAQUE IOHEXOL 300 MG/ML  SOLN  COMPARISON:  MR brain 08/29/2013.  FINDINGS: Noncontrast exam demonstrates no midline shift or hydrocephalus. Areas of abnormal vasogenic edema are seen throughout both cerebral hemispheres, as well as the cerebellar vermis. Post infusion, abnormal enhancement of the previously demonstrated multiple intracranial metastases is noted. Based on long axis measurement of index lesions in the right temporal lobe (21 mm) and left frontal lobe (17 mm), the lesions appear roughly stable.  No intracranial hemorrhage in this patient who is anticoagulated. No skull fracture is evident. No sinus or mastoid disease.  IMPRESSION: Multiple intracranial metastatic lesions appear unchanged from 08/29/2013 when technique differences are considered. No acute findings to suggest intracranial hemorrhage. No visible skull  fracture.   Electronically Signed   By: Davonna Belling M.D.   On: 09/03/2013 18:05    Other results: EKG:  atrial fibrillation with rate of 139  Assessment & Plan by Problem: #Brain metastasis- likely etiology of fall and recent confusion. From small cell lung carcinoma (Stage IIIA, T1bN2MX at diagnosis s/p radiation and chemo (carboplatin, etoposide, neulasta x 5 cycles; finished on 06/20/13)).  MRI on 08/29/13 showed widespread metastatic disease with 15 lesions identified ranging from 10-25 mm diameter; relatively mild cerebral edema with no significant intracranial mass effect at that time. CT head on 10/13 showed unchanged mets without hemorrhage; burden of edema stable since MRI.  WBC 13.2.  Pt follows with Dr. Arbutus Ped as outpatient.  ED initiated decadron therapy which we will continue in anticipation of her radiation therapy.  -admit to IMTS telemetry  -decadron IV 10 mg q24h -thickened liquids ordered pending speech language eval in AM  -swallow study with speech language in AM -palliative care consult to determine goals of care -chaplain consult -BMP, CBC in AM  #Atrial fibrillation without RVR- Pt on metoprolol and Xarelto at home and without PO metoprolol for several days. [facility] ED initiated metoprolol  5 mg IV x 3 with HR response to 101.  Pt asymptomatic, HR 110s-120s during our examination.  Will place her back on home metoprolol and use diltiazem to transition back to sinus rhythm.  CXR to rule out post-obstuctive pneumonia vs. worsening of lung carcinoma that could explain loss of rate control. UA negative. INR 1.13. -diltiazem 5 mg q6h prn HR > 115 -NS at 100 cc/hr -metoprolol 25 mg PO BID -continue Xarelto -CXR portable  #DM2- A1C 9.9 in 03/2013; only on oral agents metformin and glyburide at home.  Pt now on steroids so BGs likely more difficult to control.  -SSI moderate  -CBGs with meals and qhs  #Hypertension- BP 120s/80s . Reinitiating metoprolol as well as diltiazem per  above.   #Tobacco abuse- pt had quit smoking in 03/2013 but started smoking again in past few days after finding out about brain metastases.  -nicotine patch   #DVT PPX- on Xarelto so pt already has adequate prophylaxis therefore will not order heparin/lovenox   #Code status- Full code for now but need to revisit tomorrow.    Dispo: Disposition is deferred at this time, awaiting improvement of current medical problems. Anticipated discharge in approximately 2-3 day(s).   The patient does have a current PCP Windell Hummingbird, MD) and does need an [facility] hospital follow-up appointment after discharge.   Signed: Rocco Serene, MD 09/03/2013, 11:42 PM

## 2013-09-03 NOTE — ED Notes (Signed)
Per EMS pt was found on floor in front of her recliner by her mother when she came to check on her due to missing a doctor's appt. Pt not for sure how long she was on the floor for.  Pt has PMH brain cancer and has finished her radiation.  BP 130 palpated. Pt denies having any pain at this time.

## 2013-09-04 ENCOUNTER — Ambulatory Visit
Admit: 2013-09-04 | Discharge: 2013-09-04 | Disposition: A | Discharge status: Home / Self Care | Payer: Medicaid Other | Attending: Radiation Oncology | Admitting: Radiation Oncology

## 2013-09-04 ENCOUNTER — Inpatient Hospital Stay (HOSPITAL_COMMUNITY): Payer: Medicaid Other

## 2013-09-04 ENCOUNTER — Encounter: Payer: Self-pay | Admitting: Radiation Oncology

## 2013-09-04 ENCOUNTER — Ambulatory Visit: Payer: Medicaid Other | Admitting: Radiation Oncology

## 2013-09-04 DIAGNOSIS — Z7901 Long term (current) use of anticoagulants: Secondary | ICD-10-CM

## 2013-09-04 DIAGNOSIS — C7931 Secondary malignant neoplasm of brain: Secondary | ICD-10-CM

## 2013-09-04 LAB — CBC
HCT: 38.7 % (ref 36.0–46.0)
MCHC: 37 g/dL — ABNORMAL HIGH (ref 30.0–36.0)
MCV: 91.7 fL (ref 78.0–100.0)
RDW: 12.8 % (ref 11.5–15.5)

## 2013-09-04 LAB — BASIC METABOLIC PANEL
BUN: 22 mg/dL (ref 6–23)
CO2: 22 mEq/L (ref 19–32)
Calcium: 9.3 mg/dL (ref 8.4–10.5)
Creatinine, Ser: 0.55 mg/dL (ref 0.50–1.10)
GFR calc Af Amer: >90 mL/min (ref >90)
GFR calc non Af Amer: >90 mL/min (ref >90)
Potassium: 4.2 mEq/L (ref 3.5–5.1)

## 2013-09-04 LAB — GLUCOSE, CAPILLARY
Glucose-Capillary: 328 mg/dL — ABNORMAL HIGH (ref 70–99)
Glucose-Capillary: 225 mg/dL — ABNORMAL HIGH (ref 70–99)

## 2013-09-04 MED ORDER — DILTIAZEM HCL 25 MG/5ML IV SOLN
2.5000 mg | Freq: Once | INTRAVENOUS | Status: DC
  Filled 2013-09-04: qty 5

## 2013-09-04 MED ORDER — LEVETIRACETAM 500 MG PO TABS
500.0000 mg | ORAL_TABLET | Freq: Two times a day (BID) | ORAL | Status: DC
Start: 2013-09-05 — End: 2013-09-14
  Administered 2013-09-05 – 2013-09-14 (×19): 500 mg via ORAL
  Filled 2013-09-04 (×20): qty 1

## 2013-09-04 MED ORDER — STARCH (THICKENING) PO POWD
ORAL | Status: DC | PRN
  Filled 2013-09-04 (×2): qty 227

## 2013-09-04 MED ORDER — SODIUM CHLORIDE 0.9 % IV SOLN
1000.0000 mg | Freq: Once | INTRAVENOUS | Status: AC
  Administered 2013-09-04: 1000 mg via INTRAVENOUS
  Filled 2013-09-04: qty 10

## 2013-09-04 MED ORDER — DILTIAZEM HCL 100 MG IV SOLR
5.0000 mg/h | INTRAVENOUS | Status: DC
  Administered 2013-09-04: 10 mg/h via INTRAVENOUS
  Administered 2013-09-05 – 2013-09-06 (×2): 5 mg/h via INTRAVENOUS
  Filled 2013-09-04 (×3): qty 100

## 2013-09-04 NOTE — H&P (Signed)
INTERNAL MEDICINE TEACHING SERVICE Attending Admission Note  Date: 09/04/2013  Patient name: Anita Gardner  Medical record number: [id number]  Date of birth: May 16, 1956    I have seen and evaluated [name] and discussed their care with the Residency Team.  57 yr old female with hx of SCLC Stage IIIA w/ hx chemo and radiation, with recently discovered brain metastatic disease, Afib, HTN, Type 2 DM, COPD, presented with a fall. There is hx of found on floor(?) with stool and urine incontinence. This brings up concern for a seizure.  I agree with treatment of vasogenic brain edema with Decadron.  Will need to consider addition of Keppra to her regimen if this was a seizure. Consult Rad Onc. Will need goals of care discussion. SLP consult due to hx of difficulty with swallowing.  Jonah Blue, DO, FACP Faculty [facility] Internal Medicine Residency Program 09/04/2013, 10:35 AM

## 2013-09-04 NOTE — Evaluation (Signed)
Clinical/Bedside Swallow Evaluation Patient Details  Name: Anita Gardner MRN: [id number] Date of Birth: 12/23/1955  Today's Date: 09/04/2013 Time: 0822-0856 SLP Time Calculation (min): 34 min  Past Medical History:  Past Medical History  Diagnosis Date  . Chronic pain of left knee     secondary to arthritis  . Hypertension   . GERD (gastroesophageal reflux disease)   . Constipation   . Anxiety   . Depression   . Panic attacks   . Bipolar 1 disorder   . COPD (chronic obstructive pulmonary disease)   . Status post chemotherapy 03/01/2013-5/22/2014concurrent with Radiation Therapy    carboplatin for AUC of 5 on day 1 and etoposide 120 mg/M2 days 1, 2 and 3 with Neulasta support on day 4. The patient is status post 4 cycles. This is concurrent with radiation.  . S/P radiation therapy 03/01/2013-04/12/2013    Left Lung and Nodes/ 62 Gy in 31 fractions  . High cholesterol   . Atrial fibrillation   . Diabetes mellitus type 2, uncontrolled, without complications DX: 2000    on oral medications only  . Diabetic peripheral neuropathy   . Anemia   . History of blood transfusion     "twice w/cancer treatments" (09/03/2013)  . Incontinence of urine   . 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 Tyrone Sage (CVTS), Dr. Gwenyth Bouillon (Onc) // LLL mass noted 11/2012// PET scan 12/2012 - mediastinal + left infrahilar nodal metastasis AND low left mediastinum or posterior left pericardium is hypermetabolic likely nodal metastasis // Stage IIIA, T1bN2MX at diagnosis // Bronchoscopy 01/2013 confirmed small cell ca. // Rad tx 04-06/14 // chemo started 04/14  . Brain metastases     /notes 09/03/2013   Past Surgical History:  Past Surgical History  Procedure Laterality Date  . Skin graft Left     as a child; "bruise that never went away;Dr. Nonie Hoyer did OR; skin graft to leg" (09/03/2013)   . Video bronchoscopy with endobronchial ultrasound N/A 02/14/2013    Procedure: VIDEO BRONCHOSCOPY  WITH ENDOBRONCHIAL ULTRASOUND;  Surgeon: Delight Ovens, MD;  Location: [facility] OR;  Service: Thoracic;  Laterality: N/A;   HPI:  57 year old woman with history of small cell lung cancer (Stage IIIA, T1bN2MX at diagnosis s/p radiation and chemo (carboplatin, etoposide, neulasta x 5 cycles; finished on 06/20/13) with multiple brain mets seen on 10/8 MRI), atrial fibrillation (on metoprolol and Xarelto), DM2, HTN, COPD who presented to ED after a fall.  Patient states that this morning 10/13, she was sitting in her recliner when she slid off of it and did not have the strength to pull herself back up. Patient states she did not lose consciousness or hit or head.  On ROS, decreased appetite but no difficulty swallowing; occasional nausea but no vomiting. Denies fever, chest pain, palpitations, shortness of breath, cough, abdominal pain, diarrhea. She did recently start smoking again after hearing news about her multiple brain metastases discovered on MRI brain on 08/29/13, unclear how much.  CXR Left base atelectasis or pneumonia with small pleural effusion.   Additional PMH:  GERD, bipolar, COPD, HTN, DM   Assessment / Plan / Recommendation Clinical Impression  Pt. exhibiting moderate oral dysphagia with decreased manipulation and transit with right buccal pocketing with moderate verbal cues to remove.  Pt. with lung CA, COPD and frequently coughs at baseline however, she consistently coughed immediately following cup sips of thin liquids which significantly decreased with nectar thick juice.  Strong and  prolonged cough after eggs due to prolonged oral manipulation and suspicion of premature spill.  SLP educated on importance of taking small bites and sips to increase safety.  She will need supervision and reiteration of strategies as working memory deficits were exhibited.  Recommend Dys 3 diet texture and continue nectar thick liquids and swallow strategies with assist.  Unsure of pt.'s prognosis and if pt. does  not like the thickener or is not hydrated, she has the choice to return to thin liquids with known risks if desired.  ST will follow.     Aspiration Risk  Moderate    Diet Recommendation Dysphagia 3 (Mechanical Soft);Nectar-thick liquid   Liquid Administration via: Cup;No straw Medication Administration: Whole meds with puree Supervision: Patient able to self feed;Full supervision/cueing for compensatory strategies Compensations: Slow rate;Small sips/bites;Check for pocketing Postural Changes and/or Swallow Maneuvers: Seated upright 90 degrees    Other  Recommendations Oral Care Recommendations: Oral care BID   Follow Up Recommendations  None (versus home health)    Frequency and Duration min 2x/week  2 weeks   Pertinent Vitals/Pain none    SLP Swallow Goals Patient will utilize recommended strategies during swallow to increase swallowing safety with: Moderate assistance   Swallow Study           Oral/Motor/Sensory Function Overall Oral Motor/Sensory Function: Appears within functional limits for tasks assessed   Ice Chips Ice chips: Not tested   Thin Liquid Thin Liquid: Impaired Presentation: Cup Pharyngeal  Phase Impairments: Cough - Immediate    Nectar Thick Nectar Thick Liquid: Impaired Presentation: Cup Pharyngeal Phase Impairments: Cough - Immediate (coughed one out of 6 sips)   Honey Thick Honey Thick Liquid: Not tested   Puree Puree: Within functional limits   Solid   GO    Solid: Impaired Oral Phase Impairments: Impaired anterior to posterior transit Oral Phase Functional Implications: Right lateral sulci pocketing;Oral residue       [name] M.Ed [employer] [phone]  09/04/2013

## 2013-09-04 NOTE — Progress Notes (Signed)
Late Entry Palliative Medicine Team consult for goals of care received; writer spoke with patient and daughter [name] at bedside meeting scheduled for tomorrow, Wednesday 09/05/13 @ 2:00 pm - as of this entry note patient may transfer tomorrow to [facility] per Dr Colletta Maryland note - PMT will follow-up tomorrow to determine if PMT meeting can proceed as planned or need to be re-scheduled  Valente David, RN 09/04/2013, 5:30 PM Palliative Medicine Team RN Liaison [phone]

## 2013-09-04 NOTE — Progress Notes (Signed)
Inpatient Diabetes Program Recommendations  AACE/ADA: New Consensus Statement on Inpatient Glycemic Control (2013)  Target Ranges:  Prepandial:   less than 140 mg/dL      Peak postprandial:   less than 180 mg/dL (1-2 hours)      Critically ill patients:  140 - 180 mg/dL     Results for VEARL, AITKEN (MRN [id number]) as of 09/04/2013 14:19  Ref. Range 09/04/2013 07:48 09/04/2013 11:49  Glucose-Capillary Latest Range: 70-99 mg/dL 409 (H) 811 (H)    Inpatient Diabetes Program Recommendations Insulin - Basal: MD- Please consider adding basal insulin to patient's in-hospital regimen while patient receiving IV steroids- Recommend Levemir 15 units QHS (0.2 units/kg dosing)   Will follow. Ambrose Finland RN, MSN, CDE Diabetes Coordinator Inpatient Diabetes Program Team Pager: [phone] (8a-10p)

## 2013-09-04 NOTE — Progress Notes (Signed)
Pt requested prayer with chaplain through RN and consult request.   Chaplain visited with pt as she was finishing breakfast. Pt said she "needed encouragement." She explained that she had previously beaten lung cancer, but now had "20 small tumors in her brain" and she is "scared of losing control, of being a 'jellyhead.'" She also said she is worried for her children.  The pt said she knows God is the Chartered loss adjuster," but "sometimes her faith falters."   Pt expressed regret for her years of smoking, saying she "stopped years ago." She "wishes she had never taken that first cigarette, but teenagers are stupid." She says she has forgiven herself.   Chaplain explored these themes with the pt, providing emotional/spiritual support, empathic listening, compassionate presence, and prayer.   Pt would like information on the flu shot from her RN.   Will follow up, but please enter consult request or page if pt requests chaplain. Thank you.   Maurene Capes, [place] [phone]

## 2013-09-04 NOTE — Progress Notes (Signed)
Met with the patient this afternoon. According to her history, she was getting up from her recliner at home and the next thing she knew her mom was ushering in EMS. The patient reports that she was groggy and confused at that time. She thinks she probably blacked out. She was incontinent upon waking.  We had a discussion about her goals of care. I told her I would support her decision whether or not she wanted whole brain radiotherapy. I once again explained whole brain radiotherapy would not be curative and would not make her cancer go away forever. However her type of cancer is radiosensitive and the radiotherapy would have a good chance of shrinking down her brain tumors, and protecting her from neurologic decline, headaches, and seizures. She understands that there could also be some benefit in terms of her overall survival from whole brain radiotherapy. She wants to proceed with treatment planning.  I spoke with Dr. Kem Kays. I told him am suspicious that she had a seizure and I think it's reasonable for her to start on Keppra. He will facilitate this. I also suggest that the patient be transferred [facility] if she is going to need at least a couple more nights as an inpatient. He says he thinks he'll be able to transfer her by tomorrow morning. Therefore we will schedule a radiation planning session for her tomorrow afternoon at [facility]. Anticipate starting her radiotherapy next day.  -----------------------------------  Lonie Peak, MD

## 2013-09-04 NOTE — Progress Notes (Signed)
Patient's AM CBG 421. MD notified & ordered to give max dose of SSI (15 Units).  Will continue to monitor. [place], Mitzi Hansen

## 2013-09-04 NOTE — Progress Notes (Signed)
Subjective: No complaints today. Lying in bed comfortably. Speech appears slightly slurred, and pt says she has been having some coughing while eating solid food, says . Would like to talk to palliative care. Had an appointment yesterday and today with her Rad-Onc, for simulation yesterday and radiation today.    Objective: Vital signs in last 24 hours: Filed Vitals:   09/04/13 0357 09/04/13 1001 09/04/13 1335 09/04/13 1442  BP: 140/87 137/86  115/76  Pulse: 108 123 108 121  Temp: 98.5 F (36.9 C)   97.4 F (36.3 C)  TempSrc: Oral     Resp: 20   21  Height:      Weight:      SpO2: 92%   95%   Weight change:   Intake/Output Summary (Last 24 hours) at 09/04/13 1516 Last data filed at 09/04/13 1300  Gross per 24 hour  Intake    720 ml  Output      0 ml  Net    720 ml   Physical exam-   General: Not in any distress, co-operative, pleasant. Lungs: Clear to auscultation bilat. Heart: irregularly irregular, no murmurs, gallops, or rubs Abdomen: soft, non-tender, non-distended, normal bowel sounds  Extremities:Mild pitting edema of bilateral lower extremities, pulses 2+ symm. Neurologic: alert & oriented X3, speech appears slightly slurred.   Lab Results: Basic Metabolic Panel:  Recent Labs Lab 09/03/13 1652 09/03/13 1700 09/04/13 0341  NA 133* 135 136  K 3.7 4.3 4.2  CL 97 100 98  CO2 23  --  22  GLUCOSE 330* 323* 448*  BUN 20 27* 22  CREATININE 0.60 0.90 0.55  CALCIUM 10.0  --  9.3   Liver Function Tests:  Recent Labs Lab 09/03/13 1652  AST 24  ALT 16  ALKPHOS 64  BILITOT 1.2  PROT 7.2  ALBUMIN 3.9   No results found for this basename: LIPASE, AMYLASE,  in the last 168 hours No results found for this basename: AMMONIA,  in the last 168 hours CBC:  Recent Labs Lab 09/03/13 1645 09/03/13 1700 09/04/13 0341  WBC 13.2*  --  12.2*  HGB 16.1* 17.0* 14.3  HCT 44.6 50.0* 38.7  MCV 91.8  --  91.7  PLT 225  --  223   CBG:  Recent Labs Lab  09/03/13 1623 09/03/13 2129 09/04/13 0748 09/04/13 1149  GLUCAP 325* 268* 421* 256*   Coagulation:  Recent Labs Lab 09/03/13 1652  LABPROT 14.3  INR 1.13   Urinalysis:  Recent Labs Lab 09/03/13 1728  COLORURINE AMBER*  LABSPEC 1.038*  PHURINE 6.0  GLUCOSEU >1000*  HGBUR TRACE*  BILIRUBINUR MODERATE*  KETONESUR >80*  PROTEINUR 100*  UROBILINOGEN 1.0  NITRITE NEGATIVE  LEUKOCYTESUR NEGATIVE   Micro Results: Recent Results (from the past 240 hour(s))  URINE CULTURE     Status: None   Collection Time    09/03/13  5:28 PM      Result Value Range Status   Specimen Description URINE, CLEAN CATCH   Final   Special Requests NONE   Final   Culture  Setup Time     Final   Value: 09/03/2013 22:09     Performed at [facility] Count PENDING   Incomplete   Culture     Final   Value: Culture reincubated for better growth     Performed at [facility]   Report Status PENDING   Incomplete   Studies/Results: Ct Head W Wo Contrast  09/03/2013   CLINICAL DATA:  Lung cancer. Documented metastases to the brain. Found on floor unresponsive today.  EXAM: CT HEAD WITHOUT AND WITH CONTRAST  TECHNIQUE: Contiguous axial images were obtained from the base of the skull through the vertex without and with intravenous contrast  CONTRAST:  80mL OMNIPAQUE IOHEXOL 300 MG/ML  SOLN  COMPARISON:  MR brain 08/29/2013.  FINDINGS: Noncontrast exam demonstrates no midline shift or hydrocephalus. Areas of abnormal vasogenic edema are seen throughout both cerebral hemispheres, as well as the cerebellar vermis. Post infusion, abnormal enhancement of the previously demonstrated multiple intracranial metastases is noted. Based on long axis measurement of index lesions in the right temporal lobe (21 mm) and left frontal lobe (17 mm), the lesions appear roughly stable.  No intracranial hemorrhage in this patient who is anticoagulated. No skull fracture is evident. No sinus or mastoid  disease.  IMPRESSION: Multiple intracranial metastatic lesions appear unchanged from 08/29/2013 when technique differences are considered. No acute findings to suggest intracranial hemorrhage. No visible skull fracture.   Electronically Signed   By: Davonna Belling M.D.   On: 09/03/2013 18:05   Portable Chest 1 View  09/04/2013   CLINICAL DATA:  Cough and shortness of breath.  EXAM: PORTABLE CHEST - 1 VIEW  COMPARISON:  02/14/2013  FINDINGS: Cardiopericardial enlargement, which appears similar to 06/12/2013 CT. There is a left base opacity, a combination of lung opacification and pleural fluid. Indistinct right base opacity, which may be related to hypoaeration. No evidence of pneumothorax.  IMPRESSION: Left base atelectasis or pneumonia with small pleural effusion.   Electronically Signed   By: Tiburcio Pea M.D.   On: 09/04/2013 05:36   Medications: I have reviewed the patient's current medications. Scheduled Meds: . dexamethasone  10 mg Intravenous Q24H  . famotidine  20 mg Oral BID  . insulin aspart  0-15 Units Subcutaneous TID WC  . insulin aspart  0-5 Units Subcutaneous QHS  . magnesium oxide  400 mg Oral BID  . metoprolol tartrate  25 mg Oral BID  . nicotine  14 mg Transdermal Daily  . Rivaroxaban  20 mg Oral Q supper  . sodium chloride  3 mL Intravenous Q12H   Continuous Infusions:  PRN Meds:.diltiazem, food thickener Assessment/Plan: Principal Problem:   Brain metastases Active Problems:   Diabetes mellitus type 2, uncontrolled, without complications   Small cell lung carcinoma   Atrial fibrillation  #Brain metastasis- Patients fall likley due to Brain mets from Small cell lung ca., seen on MRI on 08/29/13- widespread metastatic disease with 15 lesions identified ranging from 10-25 mm diameter; relatively mild cerebral edema with no significant intracranial mass effect at that time. Follow with Rad-Onc, and was previously scheduled for radiation therapy today. ED initiated decadron  therapy which we will continue while on admission. SPL eval- Recs appreciated, Dysphagia 3 diet. - Consult pts Rad- Onc. - Cont tele.  - Cont Decadron IV 10 mg q24h  - Palliative care consult to determine goals of care  - Keppra- 1000mg  loading dose IV. Then continue with 500mg  BID. - Consider neurology consult.  #Atrial fibrillation without RVR- home meds metoprolol and Xarelto at home and without PO metoprolol for several days. Pt asymptomatic, HR 108 -123 today. Will place her back on home metoprolol and use diltiazem to transition back to sinus rhythm. CXR to rule out post-obstuctive pneumonia vs. worsening of lung carcinoma that could explain loss of rate control- showed smalll left base athelectasis. -Cont diltiazem 5 mg q6h prn  HR > 115  -NS at 100 cc/hr  -metoprolol 25 mg PO BID  -continue Xarelto   #DM2- A1C 9.9 in 03/2013; only on oral agents metformin and glyburide at home. Pt now on steroids so BGs likely more difficult to control.  -Cont SSI moderate  -CBGs with meals and qhs  #Hypertension- Stable . Cont metoprolol as well as diltiazem per above.  #Tobacco abuse- pt had quit smoking in 03/2013 but started smoking again in past few days after finding out about brain metastases.  -nicotine patch  #DVT PPX- on Xarelto so pt already has adequate prophylaxis therefore will not order heparin/lovenox  #Code status- Full code for now but need to revisit tomorrow.    Dispo: Disposition is deferred at this time, awaiting improvement of current medical problems.    The patient does have a current PCP Windell Hummingbird, MD) and does need an [facility] hospital follow-up appointment after discharge.  The patient does not have transportation limitations that hinder transportation to clinic appointments.  .Services Needed at time of discharge: Y = Yes, Blank = No PT:   OT:   RN:   Equipment:   Other:     LOS: 1 day   Kennis Carina, MD 09/04/2013, 3:16 PM

## 2013-09-05 ENCOUNTER — Inpatient Hospital Stay (HOSPITAL_COMMUNITY): Payer: Medicaid Other

## 2013-09-05 ENCOUNTER — Ambulatory Visit
Admit: 2013-09-05 | Discharge: 2013-09-05 | Disposition: A | Discharge status: Home / Self Care | Payer: Medicaid Other | Attending: Radiation Oncology | Admitting: Radiation Oncology

## 2013-09-05 DIAGNOSIS — C7931 Secondary malignant neoplasm of brain: Secondary | ICD-10-CM

## 2013-09-05 DIAGNOSIS — E669 Obesity, unspecified: Secondary | ICD-10-CM

## 2013-09-05 LAB — GLUCOSE, CAPILLARY
Glucose-Capillary: 319 mg/dL — ABNORMAL HIGH (ref 70–99)
Glucose-Capillary: 313 mg/dL — ABNORMAL HIGH (ref 70–99)

## 2013-09-05 LAB — URINE CULTURE

## 2013-09-05 MED ORDER — OXYCODONE-ACETAMINOPHEN 5-325 MG PO TABS
1.0000 | ORAL_TABLET | ORAL | Status: DC | PRN
  Administered 2013-09-05 – 2013-09-10 (×7): 1 via ORAL
  Administered 2013-09-10: 2 via ORAL
  Administered 2013-09-10: 1 via ORAL
  Administered 2013-09-11 – 2013-09-13 (×6): 2 via ORAL
  Administered 2013-09-14: 1 via ORAL
  Administered 2013-09-14: 2 via ORAL
  Filled 2013-09-05: qty 1
  Filled 2013-09-05 (×2): qty 2
  Filled 2013-09-05: qty 1
  Filled 2013-09-05: qty 2
  Filled 2013-09-05: qty 1
  Filled 2013-09-05 (×2): qty 2
  Filled 2013-09-05: qty 1
  Filled 2013-09-05 (×3): qty 2
  Filled 2013-09-05: qty 1
  Filled 2013-09-05: qty 2
  Filled 2013-09-05 (×2): qty 1
  Filled 2013-09-05: qty 2

## 2013-09-05 MED ORDER — HYDROMORPHONE HCL PF 1 MG/ML IJ SOLN: 1.0000 mg | INTRAMUSCULAR | Status: DC | PRN

## 2013-09-05 MED ORDER — INSULIN DETEMIR 100 UNIT/ML ~~LOC~~ SOLN
15.0000 [IU] | Freq: Every day | SUBCUTANEOUS | Status: DC
  Administered 2013-09-05 – 2013-09-07 (×3): 15 [IU] via SUBCUTANEOUS
  Filled 2013-09-05 (×5): qty 0.15

## 2013-09-05 NOTE — Progress Notes (Signed)
  Radiation Oncology         [phone] ________________________________  Name: Anita Gardner MRN: [id number]  Date: 09/03/2013  DOB: 1956/06/02  SIMULATION AND TREATMENT PLANNING NOTE  Inpatient  DIAGNOSIS:  Brain metastases  NARRATIVE:  The patient was brought to the CT Simulation planning suite.  Identity was confirmed.  All relevant records and images related to the planned course of therapy were reviewed.  The patient freely provided informed written consent to proceed with treatment after reviewing the details related to the planned course of therapy. The consent form was witnessed and verified by the simulation staff.    Then, the patient was set-up in a stable reproducible  supine position for radiation therapy.  CT images were obtained.  Surface markings were placed.  The CT images were loaded into the planning software.    TREATMENT PLANNING NOTE: Treatment planning then occurred.  The radiation prescription was entered and confirmed.    A total of 3 medically necessary complex treatment devices were fabricated and supervised by me - 2 fields with MLCs to block globes, lenses, throat; and an aquaplast mask. I have requested : Isodose Plan, dose calcs.   The patient will receive 30 Gy in 10 fractions to her whole brain. Radiotherapy to start tomorrow.   -----------------------------------  Lonie Peak, MD

## 2013-09-05 NOTE — Discharge Summary (Signed)
Name: Anita Gardner MRN: [id number] DOB: Apr 03, 1956 57 y.o. PCP: Windell Hummingbird, MD  Date of Admission: 09/03/2013  4:01 PM Date of Discharge: 09/05/2013 Attending Physician: Jerald Kief, MD  Discharge Diagnosis:  Principal Problem:   Brain metastases Active Problems:   Diabetes mellitus type 2, uncontrolled, without complications   Small cell lung carcinoma   Atrial fibrillation  Discharge Medications:   Medication List    ASK your doctor about these medications       acetaminophen 650 MG CR tablet  Commonly known as:  TYLENOL  Take 1,300 mg by mouth every 8 (eight) hours as needed for pain.     Fish Oil 1200 MG Caps  Take 1,200 mg by mouth daily.     furosemide 20 MG tablet  Commonly known as:  LASIX  Take 1 tablet (20 mg total) by mouth daily as needed.     glyBURIDE 5 MG tablet  Commonly known as:  DIABETA  Take 5 mg by mouth 2 (two) times daily with a meal.     magnesium oxide 400 MG tablet  Commonly known as:  MAG-OX  Take 1 tablet (400 mg total) by mouth 2 (two) times daily.     metFORMIN 1000 MG tablet  Commonly known as:  GLUCOPHAGE  Take 1,000 mg by mouth 2 (two) times daily with a meal.     metoprolol tartrate 25 MG tablet  Commonly known as:  LOPRESSOR  Take 1 tablet (25 mg total) by mouth 2 (two) times daily.     multivitamin with minerals Tabs tablet  Take 1 tablet by mouth daily.     oxybutynin 5 MG tablet  Commonly known as:  DITROPAN  Take 5 mg by mouth 3 (three) times daily.     prochlorperazine 10 MG tablet  Commonly known as:  COMPAZINE  Take 10 mg by mouth every 6 (six) hours as needed (for nausea).     ranitidine 150 MG tablet  Commonly known as:  ZANTAC  Take 150 mg by mouth 2 (two) times daily.     Rivaroxaban 20 MG Tabs tablet  Commonly known as:  XARELTO  Take 1 tablet (20 mg total) by mouth daily with supper.     Vitamin D-3 5000 UNITS Tabs  Take 5,000 Units by mouth daily.        Disposition and  follow-up:   Anita Gardner was discharged from [facility] in Fair condition.  At the hospital follow up visit please address:  1. Continued use/need for basal insulin, as this was only instituted because patients blood sugars started running high after institution of decadron therapy. Please assess if patient requires this medication long term, otherwise can be discontinued and commenced on her previous oral hypoglycemic agents only.  Discharge Instructions:      Future Appointments Provider Department Dept Phone   09/06/2013 1:30 PM [facility] RAD[facility] ONCOLOGY [phone]   09/07/2013 2:30 PM [facility] RADIATION ONCOLOGY [phone]   09/10/2013 1:00 PM [facility] [place] RAD[facility] ONCOLOGY [phone]   09/11/2013 11:40 AM [facility] RADIATION ONCOLOGY [phone]   09/12/2013 11:50 AM [facility] [place] [facility] RADIATION ONCOLOGY [phone]   09/13/2013 2:35 PM [facility] [place] [facility] RAD[facility]ATION ONCOLOGY [phone]   09/14/2013 2:35 PM [facility] [place] [facility] RADIATION ONCOLOGY [phone]  09/17/2013 1:35 PM [facility] RADIATION ONCOLOGY [phone]   09/18/2013 1:35 PM [facility] [place] [facility] RADIATION ONCOLOGY [phone]   09/19/2013 11:00 AM Windell Hummingbird [facility] MEDICAL ONCOLOGY [phone]   09/19/2013 12:00 PM [facility] [phone]   Patient to arrive 15 minutes prior to appointment time. Patient to pick up oral contrast at least 1 day prior to exam, unless otherwise instructed by your physician. No solid food 4 hours prior to exam. Liquids and Medicines are okay.   09/19/2013 1:35 PM [facility] [place] [facility]  RADIATION ONCOLOGY [phone]   09/20/2013 11:30 AM Si Gaul, MD [facility] MEDICAL ONCOLOGY [phone]   09/25/2013 10:00 AM Windell Hummingbird, MD [facility] [phone]   10/16/2013 2:00 PM Wendall Stade, MD [facility] Office [phone]      Consultations: Treatment Team:  Palliative [facility]  Procedures Performed:  Dg Chest 2 View  09/05/2013   CLINICAL DATA:  Cough and congestion.  History of lung cancer.  EXAM: CHEST  2 VIEW  COMPARISON:  CHEST x-ray 09/04/2013.  Prior CT examinations.  FINDINGS: Ill-defined interstitial prominence throughout the left lower lobe, likely to represent evolving post radiation changes. No definite acute consolidative airspace disease. Small left pleural effusion. No evidence of pulmonary edema. Heart size is mildly enlarged (unchanged). Upper mediastinal contours are within normal limits. Atherosclerosis in the thoracic aorta.  IMPRESSION: 1. The appearance of the chest is similar to prior studies, favored to reflect evolving postradiation changes in the lower left hemithorax. Strictly speaking, left lower lobe bronchopneumonia is difficult to entirely exclude, but is not strongly favored. Clinical correlation is recommended.   Electronically Signed   By: Trudie Reed M.D.   On: 09/05/2013 12:43   Ct Head W Wo Contrast  09/03/2013   CLINICAL DATA:  Lung cancer. Documented metastases to the brain. Found on floor unresponsive today.  EXAM: CT HEAD WITHOUT AND WITH CONTRAST  TECHNIQUE: Contiguous axial images were obtained from the base of the skull through the vertex without and with intravenous contrast  CONTRAST:  80mL OMNIPAQUE IOHEXOL 300 MG/ML  SOLN  COMPARISON:  MR brain 08/29/2013.  FINDINGS: Noncontrast exam demonstrates no midline shift or hydrocephalus. Areas of abnormal vasogenic edema are seen throughout both cerebral hemispheres, as well as the cerebellar vermis. Post infusion,  abnormal enhancement of the previously demonstrated multiple intracranial metastases is noted. Based on long axis measurement of index lesions in the right temporal lobe (21 mm) and left frontal lobe (17 mm), the lesions appear roughly stable.  No intracranial hemorrhage in this patient who is anticoagulated. No skull fracture is evident. No sinus or mastoid disease.  IMPRESSION: Multiple intracranial metastatic lesions appear unchanged from 08/29/2013 when technique differences are considered. No acute findings to suggest intracranial hemorrhage. No visible skull fracture.   Electronically Signed   By: Davonna Belling M.D.   On: 09/03/2013 18:05   Mr Laqueta Jean YT Contrast  08/29/2013   CLINICAL DATA:  57 year old female with lung cancer. Weakness and loss of balance. Restaging. Subsequent encounter.  BUN and creatinine were obtained on site at [facility] at  [address]  Results:  BUN 8 mg/dL,  Creatinine 0.5 mg/dL.  EXAM: MRI HEAD WITHOUT AND WITH CONTRAST  TECHNIQUE: Multiplanar, multiecho pulse sequences of the brain and surrounding structures were obtained according to standard protocol without  and with intravenous contrast  CONTRAST:  18mL MULTIHANCE GADOBENATE DIMEGLUMINE 529 MG/ML IV SOLN  COMPARISON:  02/25/2013.  FINDINGS: There are now numerous brain masses, most with combined rim and heterogeneous central enhancement. All of the larger lesions demonstrate restricted diffusion compatible with hypercellularity. Relatively mild degrees of cerebral edema occur. No significant intracranial mass effect at this time. Most of the lesions are about 15 mm diameter. The largest are up to 25 mm diameter.  Approximately 15 metastases are identified.  No superimpose restricted diffusion suggestive of acute infarct. Stable cerebral volume. Negative pituitary, cervicomedullary junction and visualized cervical spine. No ventriculomegaly. No acute intracranial hemorrhage identified. Major intracranial  vascular flow voids are stable.  Visualized orbit soft tissues are within normal limits. Visualized paranasal sinuses and mastoids are clear. Visualized scalp soft tissues are within normal limits.  Visualized bone marrow signal is stable and within normal limits.  IMPRESSION: 1. Widespread metastatic disease to the brain. Approximately 15 metastases are identified ranging from 10-25 mm diameter.  2. Relatively mild cerebral edema. No significant intracranial mass effect at this time.  These results will be called to the ordering clinician or representative by the Radiologist Assistant, and communication documented in the PACS Dashboard.   Electronically Signed   By: Augusto Gamble M.D.   On: 08/29/2013 19:12   Portable Chest 1 View  09/04/2013   CLINICAL DATA:  Cough and shortness of breath.  EXAM: PORTABLE CHEST - 1 VIEW  COMPARISON:  02/14/2013  FINDINGS: Cardiopericardial enlargement, which appears similar to 06/12/2013 CT. There is a left base opacity, a combination of lung opacification and pleural fluid. Indistinct right base opacity, which may be related to hypoaeration. No evidence of pneumothorax.  IMPRESSION: Left base atelectasis or pneumonia with small pleural effusion.   Electronically Signed   By: Tiburcio Pea M.D.   On: 09/04/2013 05:36   Admission HPI:Ms. Cranmer is a 57 year old woman with history of small cell lung cancer (Stage IIIA, T1bN2MX at diagnosis s/p radiation and chemo (carboplatin, etoposide, neulasta x 5 cycles; finished on 06/20/13) with multiple brain mets seen on 10/8 MRI), atrial fibrillation (on metoprolol and Xarelto), DM2, HTN, COPD who presented to ED after a fall.  Patient states that this morning 10/13, she was sitting in her recliner when she slid off of it and did not have the strength to pull herself back up. She was on the floor for a couple of hours before her mother came to check on her and found her lying there incontinent to urine and feces. Patient states she  did not lose consciousness or hit or head. No pain at present.  On ROS, decreased appetite but no difficulty swallowing; occasional nausea but no vomiting. Denies fever, chest pain, palpitations, shortness of breath, cough, abdominal pain, diarrhea.  She is supposed to take metoprolol 25 mg BID at home for rate control of her atrial fibrillation. She manages her own medications and does not think she has been taking that one.  She did recently start smoking again after hearing news about her multiple brain metastases discovered on MRI brain on 08/29/13, unclear how much.  In ED, atient given IV Decadron 10 mg for vasogenic brain edema as well as IV metoprolol 5 mg x 3 doses with improvement to HR 101.     Hospital Course by problem list:   #Brain metastasis- Pt with hx of small cell lung Ca, with mets to the brain, as seen on MRI on 08/29/13- which showed  widespread metastatic disease with 15 lesions identified ranging from 10-25 mm diameter; relatively mild cerebral edema with no significant intracranial mass effect at that time. Seen By her rad-Onc 09/04/2013, plan to transfer to [facility] for Radiation therapy today- 09/05/2013.  WBC -13.2 09/03/13, 12.2 - 09/04/2013, this could be explained by the steroids which was started on admission- Decadron 10mg  Q24H. Also possible patient had a seizure, possibly aspirated and there is a pneumonitis, WBC may not be reflective of infection due to steroid intake. Initial portable chest xray 09/03/2013, showed some Lt pleural effusion. Repeat today 09/05/2013- similar appearance, Lt lower lobe pneumonia not entirely excluded,but not strongly favoured, to recs were to rely on clinical correlation. We did not start antibiotics at this point as were not inclined to clinically, but admitting physician should please follow up and can determine if antibiotics will be needed. On discharge from [facility] hospital- IV decadron 10mg  Q24H was continued, and keppra continued at 500mg  BID,  after loading dose of 1000mg  was given IV yesterday. Pt was transferred to [facility] today-09/05/2013 for radiation therapy .   #Atrial fibrillation without RVR- Home meds metoprolol and Xarelto at home and without PO metoprolol for several days. Pt asymptomatic, HR in the past 12 hrs has been in the 90s. Pt was restarted on home metoprolol 25mg  BID, with Cardizem PRN 5-15mg /hr Q6H for Hr >115. Xarelto was continued on discharge.   #DM2- A1C 9.9 in 03/2013; only on oral agents metformin and glyburide at home. Pt now on steroids so BGs were more difficult to control. Pt was placed on SSI moderate, and then started on Levemir-15u, with monitoring of blood glucose with meals and qhs.  #Hypertension- Stable . Cont metoprolol as well as diltiazem per aboveon discharge.   #Tobacco abuse- pt had quit smoking in 03/2013 but started smoking again in past few days after finding out about brain metastases.  Discharge Vitals:   BP 111/68  Pulse 85  Temp(Src) 97.7 F (36.5 C) (Oral)  Resp 18  Ht 5\' 5"  (1.651 m)  Wt 182 lb 1.6 oz (82.6 kg)  BMI 30.3 kg/m2  SpO2 96%  Discharge Labs:  Results for orders placed during the hospital encounter of 09/03/13 (from the past 24 hour(s))  GLUCOSE, CAPILLARY     Status: Abnormal   Collection Time    09/05/13  7:28 AM      Result Value Range   Glucose-Capillary 319 (*) 70 - 99 mg/dL   Comment 1 Documented in Chart    GLUCOSE, CAPILLARY     Status: Abnormal   Collection Time    09/05/13 11:29 AM      Result Value Range   Glucose-Capillary 230 (*) 70 - 99 mg/dL   Comment 1 Documented in Chart    GLUCOSE, CAPILLARY     Status: Abnormal   Collection Time    09/05/13  5:13 PM      Result Value Range   Glucose-Capillary 334 (*) 70 - 99 mg/dL  GLUCOSE, CAPILLARY     Status: Abnormal   Collection Time    09/05/13  9:06 PM      Result Value Range   Glucose-Capillary 313 (*) 70 - 99 mg/dL    Signed: Kennis Carina, MD 09/05/2013, 11:28 PM   Time  Spent on Discharge: 35 minutes Services Ordered on Discharge: None Equipment Ordered on Discharge: None

## 2013-09-05 NOTE — Consult Note (Signed)
Patient EA:[id number] Anita Gardner      DOB: 03/28/1956      XBJ:[id number]  Summary of Preliminary Goals of care, met with patient , Son Anita Gardner and Daughter Anita Gardner  Arrived to find [facility] trying to transport patient to [employer] .  Spent few min with patient who reported that she wanted to transfer and speak with radiation doctor.  She was slightly disoriented stating she couldn't go till after the meeting , which had already identified ourselves as the people for the meeting.  Ahlam stated when asked who should be responsible to help with her decisions she indicated both her children.    I then met with her Son and Daughter who have multiple questions about her prognosis and the effects of the radiation on their mother's function.  They both openly talked about their desire for comfort and dignity at the end of life.     We will met again with the patient at 8 am tomorrow to further discuss goals of care and advanced care planning.  Total time : 150 pm - 225 pm  Kauan Kloosterman Anita. [name], MD MBA The Palliative Medicine Team at [facility] Phone: [phone] Pager: [phone]

## 2013-09-05 NOTE — Progress Notes (Signed)
Inpatient Diabetes Program Recommendations  AACE/ADA: New Consensus Statement on Inpatient Glycemic Control (2013)  Target Ranges:  Prepandial:   less than 140 mg/dL      Peak postprandial:   less than 180 mg/dL (1-2 hours)      Critically ill patients:  140 - 180 mg/dL     Results for JLYN, CERROS (MRN [id number]) as of 09/05/2013 12:47  Ref. Range 09/04/2013 07:48 09/04/2013 11:49 09/04/2013 16:28 09/04/2013 21:06  Glucose-Capillary Latest Range: 70-99 mg/dL 409 (H) 811 (H) 914 (H) 225 (H)    Results for VAUNDA, GUTTERMAN (MRN [id number]) as of 09/05/2013 12:47  Ref. Range 09/05/2013 07:28 09/05/2013 11:29  Glucose-Capillary Latest Range: 70-99 mg/dL 086 (H) 578 (H)    Inpatient Diabetes Program Recommendations Insulin - Basal: MD- Please consider adding basal insulin to patient's in-hospital regimen while patient receiving IV steroids- Recommend Levemir 15 units QHS (0.2 units/kg dosing)   Will follow. Ambrose Finland RN, MSN, CDE Diabetes Coordinator Inpatient Diabetes Program Team Pager: [phone] (8a-10p)

## 2013-09-05 NOTE — Progress Notes (Signed)
  Date: 09/05/2013  Patient name: Anita Gardner  Medical record number: [id number]  Date of birth: July 15, 1956   This patient has been seen and the plan of care was discussed with the house staff. Please see their note for complete details. I concur with their findings with the following additions/corrections: Feels well today.  Admits to some coughing when she eats.  No clear evidence of aspiration pneumonia.  Agree with Keppra and continued decadron. Discussed case with Dr. Basilio Cairo yesterday. Transfer to [facility] today.  Jonah Blue, DO, FACP Faculty [facility] Internal Medicine Residency Program 09/05/2013, 3:12 PM

## 2013-09-05 NOTE — Progress Notes (Signed)
Subjective: Feels fine today. No complaints today. Has some coughing when she eats, but says this has mostly reduced since she started the dysphagia diet.  No SOB, no chest pain.   Objective: Vital signs in last 24 hours: Filed Vitals:   09/04/13 1442 09/04/13 2108 09/05/13 0338 09/05/13 1024  BP: 115/76 113/74 132/85 120/78  Pulse: 121 100 90 93  Temp: 97.4 F (36.3 C) 98.4 F (36.9 C) 97.8 F (36.6 C)   TempSrc:  Oral    Resp: 21 20 20    Height:      Weight:      SpO2: 95% 94% 100%    Weight change:   Intake/Output Summary (Last 24 hours) at 09/05/13 1148 Last data filed at 09/04/13 1300  Gross per 24 hour  Intake    360 ml  Output      0 ml  Net    360 ml   Physical Exam-  General: Lying in bed, eating breakfast,alert, co-operative, not in any distress.  Lungs: Clear to auscultation bilaterally, No extra sounds on Auscultation. Heart: irregularly irregular, no murmurs, gallops, or rubs. Abdomen: soft, non-tender, non-distended, normal bowel sounds. Extremities: No pedal edema, pulse present bilat. Neurologic: alert & oriented X3, speech appears slightly slurred.   Lab Results: Basic Metabolic Panel:  Recent Labs Lab 09/03/13 1652 09/03/13 1700 09/04/13 0341  NA 133* 135 136  K 3.7 4.3 4.2  CL 97 100 98  CO2 23  --  22  GLUCOSE 330* 323* 448*  BUN 20 27* 22  CREATININE 0.60 0.90 0.55  CALCIUM 10.0  --  9.3   Liver Function Tests:  Recent Labs Lab 09/03/13 1652  AST 24  ALT 16  ALKPHOS 64  BILITOT 1.2  PROT 7.2  ALBUMIN 3.9   CBC:  Recent Labs Lab 09/03/13 1645 09/03/13 1700 09/04/13 0341  WBC 13.2*  --  12.2*  HGB 16.1* 17.0* 14.3  HCT 44.6 50.0* 38.7  MCV 91.8  --  91.7  PLT 225  --  223   CBG:  Recent Labs Lab 09/03/13 2129 09/04/13 0748 09/04/13 1149 09/04/13 1628 09/04/13 2106 09/05/13 0728  GLUCAP 268* 421* 256* 328* 225* 319*   Coagulation:  Recent Labs Lab 09/03/13 1652  LABPROT 14.3  INR 1.13    Urinalysis:  Recent Labs Lab 09/03/13 1728  COLORURINE AMBER*  LABSPEC 1.038*  PHURINE 6.0  GLUCOSEU >1000*  HGBUR TRACE*  BILIRUBINUR MODERATE*  KETONESUR >80*  PROTEINUR 100*  UROBILINOGEN 1.0  NITRITE NEGATIVE  LEUKOCYTESUR NEGATIVE   Micro Results: Recent Results (from the past 240 hour(s))  URINE CULTURE     Status: None   Collection Time    09/03/13  5:28 PM      Result Value Range Status   Specimen Description URINE, CLEAN CATCH   Final   Special Requests NONE   Final   Culture  Setup Time     Final   Value: 09/03/2013 22:09     Performed at [facility] Count     Final   Value: >=100,000 COLONIES/ML     Performed at [facility]   Culture     Final   Value: Multiple bacterial morphotypes present, none predominant. Suggest appropriate recollection if clinically indicated.     Performed at [facility]   Report Status 09/05/2013 FINAL   Final   Studies/Results: Ct Head W Wo Contrast  09/03/2013   CLINICAL DATA:  Lung cancer. Documented  metastases to the brain. Found on floor unresponsive today.  EXAM: CT HEAD WITHOUT AND WITH CONTRAST  TECHNIQUE: Contiguous axial images were obtained from the base of the skull through the vertex without and with intravenous contrast  CONTRAST:  80mL OMNIPAQUE IOHEXOL 300 MG/ML  SOLN  COMPARISON:  MR brain 08/29/2013.  FINDINGS: Noncontrast exam demonstrates no midline shift or hydrocephalus. Areas of abnormal vasogenic edema are seen throughout both cerebral hemispheres, as well as the cerebellar vermis. Post infusion, abnormal enhancement of the previously demonstrated multiple intracranial metastases is noted. Based on long axis measurement of index lesions in the right temporal lobe (21 mm) and left frontal lobe (17 mm), the lesions appear roughly stable.  No intracranial hemorrhage in this patient who is anticoagulated. No skull fracture is evident. No sinus or mastoid disease.  IMPRESSION:  Multiple intracranial metastatic lesions appear unchanged from 08/29/2013 when technique differences are considered. No acute findings to suggest intracranial hemorrhage. No visible skull fracture.   Electronically Signed   By: Davonna Belling M.D.   On: 09/03/2013 18:05   Portable Chest 1 View  09/04/2013   CLINICAL DATA:  Cough and shortness of breath.  EXAM: PORTABLE CHEST - 1 VIEW  COMPARISON:  02/14/2013  FINDINGS: Cardiopericardial enlargement, which appears similar to 06/12/2013 CT. There is a left base opacity, a combination of lung opacification and pleural fluid. Indistinct right base opacity, which may be related to hypoaeration. No evidence of pneumothorax.  IMPRESSION: Left base atelectasis or pneumonia with small pleural effusion.   Electronically Signed   By: Tiburcio Pea M.D.   On: 09/04/2013 05:36   Medications: I have reviewed the patient's current medications. Scheduled Meds: . dexamethasone  10 mg Intravenous Q24H  . famotidine  20 mg Oral BID  . insulin aspart  0-15 Units Subcutaneous TID WC  . insulin aspart  0-5 Units Subcutaneous QHS  . levETIRAcetam  500 mg Oral BID  . magnesium oxide  400 mg Oral BID  . metoprolol tartrate  25 mg Oral BID  . nicotine  14 mg Transdermal Daily  . Rivaroxaban  20 mg Oral Q supper  . sodium chloride  3 mL Intravenous Q12H   Continuous Infusions: . 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 Seen By her rad-Onc yesterday, plan to transfer to [facility] for Radiation therapy today. WBC -13.2 09/03/13, 12.2 - 09/04/2013, this could be explained by the steroids which was started on admission- Decadron  10mg  Q24H. Also possible  patient had a seizure, possibly aspirated and there is a pneumonitis, WBC may not be reflective of infection due to steroid intake. Initial portable chest xray 09/03/2013-- Portable, showed some Lt pleural effusion. Repeat today- similar appearance, Lt lower lobe pneumonia not entirely excluded, not strongly favoured, to rely on clinical correlation. We will not be starting antibiotics at this point as there is no clinical indication, but admitting physician should please follow up and can determine if antibiotics will be needed.  - Transfer to [facility].  - Cont tele.  - Cont Decadron IV 10 mg q24h  - Palliative care consult to determine goals of care  - Keppra- 1000mg  loading dose IV yesterday. Then  continue with 500mg  BID.   #Atrial fibrillation without RVR- Home meds metoprolol and Xarelto at home and without PO metoprolol for several days. Pt asymptomatic, HR in the past 12 hrs has been in the 90s.  Will place her back on home metoprolol 25mg  BID, and have Cardizem PRN 5mg  Q6H for Hr >115.  -metoprolol 25 mg PO BID  -continue Xarelto   #DM2- A1C 9.9 in 03/2013; only on oral agents metformin and glyburide at home. Pt now on steroids so BGs likely more difficult to control.  -Cont SSI moderate  -Will start Levemir- 15u daily, while on decadron. -CBGs with meals and qhs   #Hypertension- Stable . Will Cont metoprolol as well as diltiazem per above.   #Tobacco abuse- pt had quit smoking in 03/2013 but started smoking again in past few days after finding out about brain metastases.  -nicotine patch   #DVT PPX- on Xarelto so pt already has adequate prophylaxis therefore will not order heparin/lovenox.  #Code status- Full code.   Dispo: Disposition is deferred at this time, awaiting improvement of current medical problems.  Transfer to [facility] today.   The patient does have a current PCP Windell Hummingbird, MD) and does need an [facility] hospital follow-up appointment after  discharge.  The patient does not know have transportation limitations that hinder transportation to clinic appointments.  .Services Needed at time of discharge: Y = Yes, Blank = No PT:   OT:   RN:   Equipment:   Other:     LOS: 2 days   Kennis Carina, MD 09/05/2013, 11:48 AM

## 2013-09-05 NOTE — Progress Notes (Signed)
Chaplain provided emotional support to the patient. Chaplain listened with empathy as the patient talked about family relationships and her upcoming medical procedure. Patient asked the chaplain to pray for her. Chaplain provided spiritual support through prayer. Patient said she is being transferred to [facility].    09/05/13 1100  Clinical Encounter Type  Visited With Patient  Visit Type Initial;Spiritual support;Social support  Referral From Palliative care team  Spiritual Encounters  Spiritual Needs Ritual;Emotional  Stress Factors  Patient Stress Factors Health changes;Major life changes

## 2013-09-05 NOTE — Progress Notes (Signed)
Transfer orders received.  Report called to receiving nurse at [facility].  Patient transferred to Rad/Onc at [facility] via [facility]. [place], Mitzi Hansen

## 2013-09-05 NOTE — Progress Notes (Addendum)
Speech Language Pathology Treatment: Dysphagia  Patient Details Name: Anita Gardner MRN: [id number] DOB: 1956-10-12 Today's Date: 09/05/2013 Time: 1130-1140 SLP Time Calculation (min): 10 min  Assessment / Plan / Recommendation Clinical Impression  Brief therapeutic dysphagia session due to transport arrival to take pt for xray.  Resting comfortably in bed without baseline coughing or vocal quality changes.  Pt. consumed cup sips thin liquid for diagnostic evaluation without evidence of decreased airway protection or pharyngeal dysfunction.  Unable to administer solid texture or additional trial thin at this time to further determine continued need for thickened liquids.  Pt. is scheduled to transfer to [facility] oncology unit today.  Recommend continue ST dysphagia treatment for continued diagnostic assessment (pt's desires?; need MBS for definitive evaluation versus comfort feeds with pt's diagnosis of brain mets?) for safest and most appropriate solids/liquids.  For now, continue Dys 3 diet texture and nectar thick liquids.        Pertinent Vitals N/A  SLP Plan  Continue with current plan of care    Recommendations Diet recommendations: Dysphagia 3 (mechanical soft);Nectar-thick liquid Liquids provided via: Cup;No straw Medication Administration: Whole meds with puree Supervision: Patient able to self feed;Intermittent supervision to cue for compensatory strategies Compensations: Slow rate;Small sips/bites;Check for pocketing Postural Changes and/or Swallow Maneuvers: Seated upright 90 degrees              Oral Care Recommendations: Oral care BID Follow up Recommendations:  (TBD) Plan: Continue with current plan of care       Royce Macadamia M.Ed [employer] [phone]  09/05/2013

## 2013-09-06 ENCOUNTER — Ambulatory Visit: Payer: Medicaid Other

## 2013-09-06 ENCOUNTER — Encounter (HOSPITAL_COMMUNITY): Payer: Self-pay | Admitting: Radiology

## 2013-09-06 ENCOUNTER — Inpatient Hospital Stay (HOSPITAL_COMMUNITY): Payer: Medicaid Other

## 2013-09-06 DIAGNOSIS — M25569 Pain in unspecified knee: Secondary | ICD-10-CM

## 2013-09-06 DIAGNOSIS — G8929 Other chronic pain: Secondary | ICD-10-CM

## 2013-09-06 DIAGNOSIS — I4891 Unspecified atrial fibrillation: Secondary | ICD-10-CM

## 2013-09-06 LAB — GLUCOSE, CAPILLARY
Glucose-Capillary: 213 mg/dL — ABNORMAL HIGH (ref 70–99)
Glucose-Capillary: 252 mg/dL — ABNORMAL HIGH (ref 70–99)

## 2013-09-06 MED ORDER — DILTIAZEM HCL 30 MG PO TABS: 30.0000 mg | ORAL_TABLET | Freq: Two times a day (BID) | ORAL | Status: DC

## 2013-09-06 MED ORDER — SODIUM CHLORIDE 0.9 % IV SOLN
INTRAVENOUS | Status: DC
  Administered 2013-09-06: 20 mL/h via INTRAVENOUS

## 2013-09-06 MED ORDER — DILTIAZEM HCL ER 60 MG PO CP12
60.0000 mg | ORAL_CAPSULE | Freq: Two times a day (BID) | ORAL | Status: DC
  Administered 2013-09-06 – 2013-09-14 (×17): 60 mg via ORAL
  Filled 2013-09-06 (×18): qty 1

## 2013-09-06 MED ORDER — MAGIC MOUTHWASH
5.0000 mL | Freq: Three times a day (TID) | ORAL | Status: DC
  Administered 2013-09-06 – 2013-09-14 (×25): 5 mL via ORAL
  Filled 2013-09-06 (×28): qty 5

## 2013-09-06 MED ORDER — RESOURCE THICKENUP CLEAR PO POWD
ORAL | Status: DC | PRN
  Filled 2013-09-06: qty 125

## 2013-09-06 NOTE — Progress Notes (Signed)
[facility] PROGRESS NOTE  Anita Gardner JXB:[id number] DOB: Aug 15, 1956 DOA: 09/03/2013 PCP: Windell Hummingbird, MD  Assessment/Plan: #Brain metastasis - Cont tele.  - Cont Decadron IV 10 mg q24h  - Palliative care consult note reviewed - Keppra- 1000mg  loading dose IV yesterday. Then continue with 500mg  BID. - For radiation tx 1/10 today #Atrial fibrillation without RVR- - Currently rate controlled - Will transition off cardizem gtt to scheduled q12h cardizem  -metoprolol 25 mg PO BID  -continue Xarelto  #DM2- A1C 9.9 in 03/2013; only on oral agents metformin and glyburide at home. Pt now on steroids so BGs likely more difficult to control.  -Cont SSI moderate  -Will start Levemir- 15u daily, while on decadron.  -CBGs with meals and qhs  #Hypertension- Stable . Will Cont metoprolol as well as diltiazem per above.  #Tobacco abuse- pt had quit smoking in 03/2013 but started smoking again in past few days after finding out about brain metastases.  -nicotine patch   Code Status: Full Family Communication: Pt in room (indicate person spoken with, relationship, and if by phone, the number) Disposition Plan: Pending   Consultants:  Palliative Care  Radiation Oncology  Procedures:  Radiation treatments  HPI/Subjective: No complaints. No acute events overnight  Objective: Filed Vitals:   09/05/13 1024 09/05/13 1525 09/05/13 2100 09/06/13 0608  BP: 120/78 128/90 111/68 126/82  Pulse: 93 86 85 80  Temp:  97.7 F (36.5 C) 97.7 F (36.5 C) 97.8 F (36.6 C)  TempSrc:  Oral Oral Oral  Resp:  22 18 20   Height:  5\' 5"  (1.651 m)    Weight:  82.6 kg (182 lb 1.6 oz)    SpO2:  97% 96% 96%    Intake/Output Summary (Last 24 hours) at 09/06/13 1017 Last data filed at 09/06/13 0817  Gross per 24 hour  Intake 425.84 ml  Output    850 ml  Net -424.16 ml   Filed Weights   09/03/13 2130 09/05/13 1525  Weight: 79.788 kg (175 lb 14.4 oz) 82.6 kg (182 lb 1.6 oz)     Exam:   General:  Awake, in nad  Cardiovascular: regular, s1, s2  Respiratory: normal resp effort, no wheezing  Abdomen: soft, nondistended  Musculoskeletal: perfused, no clubbing   Data Reviewed: Basic Metabolic Panel:  Recent Labs Lab 09/03/13 1652 09/03/13 1700 09/04/13 0341  NA 133* 135 136  K 3.7 4.3 4.2  CL 97 100 98  CO2 23  --  22  GLUCOSE 330* 323* 448*  BUN 20 27* 22  CREATININE 0.60 0.90 0.55  CALCIUM 10.0  --  9.3   Liver Function Tests:  Recent Labs Lab 09/03/13 1652  AST 24  ALT 16  ALKPHOS 64  BILITOT 1.2  PROT 7.2  ALBUMIN 3.9   No results found for this basename: LIPASE, AMYLASE,  in the last 168 hours No results found for this basename: AMMONIA,  in the last 168 hours CBC:  Recent Labs Lab 09/03/13 1645 09/03/13 1700 09/04/13 0341  WBC 13.2*  --  12.2*  HGB 16.1* 17.0* 14.3  HCT 44.6 50.0* 38.7  MCV 91.8  --  91.7  PLT 225  --  223   Cardiac Enzymes: No results found for this basename: CKTOTAL, CKMB, CKMBINDEX, TROPONINI,  in the last 168 hours BNP (last 3 results) No results found for this basename: PROBNP,  in the last 8760 hours CBG:  Recent Labs Lab 09/05/13 0728 09/05/13 1129 09/05/13 1713 09/05/13 2106 09/06/13 0747  GLUCAP 319* 230* 334* 313* 342*    Recent Results (from the past 240 hour(s))  URINE CULTURE     Status: None   Collection Time    09/03/13  5:28 PM      Result Value Range Status   Specimen Description URINE, CLEAN CATCH   Final   Special Requests NONE   Final   Culture  Setup Time     Final   Value: 09/03/2013 22:09     Performed at [facility] Count     Final   Value: >=100,000 COLONIES/ML     Performed at [facility]   Culture     Final   Value: Multiple bacterial morphotypes present, none predominant. Suggest appropriate recollection if clinically indicated.     Performed at [facility]   Report Status 09/05/2013 FINAL   Final     Studies: Dg  Chest 2 View  09/05/2013   CLINICAL DATA:  Cough and congestion.  History of lung cancer.  EXAM: CHEST  2 VIEW  COMPARISON:  CHEST x-ray 09/04/2013.  Prior CT examinations.  FINDINGS: Ill-defined interstitial prominence throughout the left lower lobe, likely to represent evolving post radiation changes. No definite acute consolidative airspace disease. Small left pleural effusion. No evidence of pulmonary edema. Heart size is mildly enlarged (unchanged). Upper mediastinal contours are within normal limits. Atherosclerosis in the thoracic aorta.  IMPRESSION: 1. The appearance of the chest is similar to prior studies, favored to reflect evolving postradiation changes in the lower left hemithorax. Strictly speaking, left lower lobe bronchopneumonia is difficult to entirely exclude, but is not strongly favored. Clinical correlation is recommended.   Electronically Signed   By: Trudie Reed M.D.   On: 09/05/2013 12:43    Scheduled Meds: . dexamethasone  10 mg Intravenous Q24H  . diltiazem  60 mg Oral Q12H  . famotidine  20 mg Oral BID  . insulin aspart  0-15 Units Subcutaneous TID WC  . insulin aspart  0-5 Units Subcutaneous QHS  . insulin detemir  15 Units Subcutaneous Daily  . levETIRAcetam  500 mg Oral BID  . magic mouthwash  5 mL Oral TID  . magnesium oxide  400 mg Oral BID  . metoprolol tartrate  25 mg Oral BID  . nicotine  14 mg Transdermal Daily  . Rivaroxaban  20 mg Oral Q supper  . sodium chloride  3 mL Intravenous Q12H   Continuous Infusions: . sodium chloride 20 mL/hr (09/06/13 0058)    Principal Problem:   Brain metastases Active Problems:   Diabetes mellitus type 2, uncontrolled, without complications   Small cell lung carcinoma   Atrial fibrillation    Time spent:    Cyla Haluska K  [facility] Pager [phone]. If 7PM-7AM, please contact night-coverage at [email or web address], password [facility] 09/06/2013, 10:17 AM  LOS: 3 days

## 2013-09-06 NOTE — Progress Notes (Signed)
Speech Language Pathology Treatment: Dysphagia  Patient Details Name: Anita Gardner MRN: [id number] DOB: 1956/02/15 Today's Date: 09/06/2013 Time: 4098-1191 SLP Time Calculation (min): 35 min  Assessment / Plan / Recommendation Clinical Impression  Pt seen for skilled SLP intervention to aid in determining tolerance of po diet and readiness for dietary advancement.  SLP observed pt consuming ice cream with medicine followed by nectar and thin water. Delayed oral transiting continues suspected due to frontal lobe mass exacerbated by ? Abrasion on right lateral side of tongue. Pt using liquids to aid oral transit of pills with ice cream.  No s/s aspiration apparent with po observed.    Daughter [name] reports much improvement in pt's swallowing, less pocketing as pt was more lethargic earlier during hospital coarse.    Pt denies improvement in swallow ability using nectar thickened liquids compared to thin.  Suspect improved mental status improves oral manipulation and decreases premature spillage.  Pt also admits to premorbid h/o coughing with solids more than liquids.  Note pt h/o GERD, COPD, radiation tx may impact esophageal clearance.    SLP recommends to advance diet to regular/(GROUND MEATS,extra gravy/sauce)/thin per pt desire with strategies to mitigate aspiration risk.  Also recommend consider timing magic mouthwash administration before meals to decrease oral pain allowing improved comfort with po.  Rec close monitoring of swallowing ability with initiation of radiation tx.    SLP spoke with daughter [name] at length re: swallow precautions to maximize pt comfort with po. Daughter requested SLP follow pt for dysphagia management and SLP to follow for education, modification of strategies,diet as indicated per daughter request.    HPI HPI: 57 year old woman with history of small cell lung cancer (Stage IIIA, T1bN2MX at diagnosis s/p radiation and chemo (carboplatin, etoposide,  neulasta x 5 cycles; finished on 06/20/13) with multiple brain mets seen on 10/8 MRI), atrial fibrillation (on metoprolol and Xarelto), DM2, HTN, COPD who presented to ED after a fall.  Patient states that this morning 10/13, she was sitting in her recliner when she slid off of it and did not have the strength to pull herself back up. Patient states she did not lose consciousness or hit or head.  On ROS, decreased appetite but no difficulty swallowing; occasional nausea but no vomiting. Denies fever, chest pain, palpitations, shortness of breath, cough, abdominal pain, diarrhea. She did recently start smoking again after hearing news about her multiple brain metastases discovered on MRI brain on 08/29/13, unclear how much.  CXR Left base atelectasis or pneumonia with small pleural effusion.   Additional PMH:  GERD, bipolar, COPD, HTN, DM   Pertinent Vitals Afebrile, decreased  Dg Chest 2 View  09/05/2013   CLINICAL DATA:  Cough and congestion.  History of lung cancer.  EXAM: CHEST  2 VIEW  COMPARISON:  CHEST x-ray 09/04/2013.  Prior CT examinations.  FINDINGS: Ill-defined interstitial prominence throughout the left lower lobe, likely to represent evolving post radiation changes. No definite acute consolidative airspace disease. Small left pleural effusion. No evidence of pulmonary edema. Heart size is mildly enlarged (unchanged). Upper mediastinal contours are within normal limits. Atherosclerosis in the thoracic aorta.  IMPRESSION: 1. The appearance of the chest is similar to prior studies, favored to reflect evolving postradiation changes in the lower left hemithorax. Strictly speaking, left lower lobe bronchopneumonia is difficult to entirely exclude, but is not strongly favored. Clinical correlation is recommended.   Electronically Signed   By: Trudie Reed M.D.   On: 09/05/2013 12:43  SLP Plan  Continue with current plan of care    Recommendations Diet recommendations: Regular;Thin liquid (ground  meats/extra gravy and sauce/thin per pt request) Liquids provided via: Cup;Straw Medication Administration: Whole meds with puree (follow with liquids) Supervision: Patient able to self feed;Intermittent supervision to cue for compensatory strategies Compensations: Slow rate;Small sips/bites;Check for pocketing Postural Changes and/or Swallow Maneuvers: Seated upright 90 degrees;Upright 30-60 min after meal              Oral Care Recommendations: Oral care before and after PO (? timing of medication to decreased oral pain for more pleasant swallow) Plan: Continue with current plan of care    GO     Donavan Burnet, MS [facility] SLP [phone]

## 2013-09-06 NOTE — Progress Notes (Signed)
[facility] Radiation Oncology Dept Therapy Treatment Record Phone [phone]   Radiation Therapy was administered to Anita Gardner on: 09/06/2013  1:27 PM and was treatment #out of a planned course oftreatments.   DISREGUARD PREVIOUS FAST NOTE- PATIENT DID NOT COME DOWN FOR RAD ONC APPT TODAY- pt refused.

## 2013-09-06 NOTE — Progress Notes (Signed)
Patient [id number] Anita Gardner      DOB: [birth year]      ION:[id number]   Met with Patient, Daughter Anita Gardner and Mother .  Patient expresses that her current goals include trying to live as long as she can but she expressed that if she dies she would not want to go through CPR and Vent support.  We confirmed this with several different questions.  Of note the patient is sleepy but is oriented to time, place, person and able to express herself appropriately.  Full note to follow.  Present when patient's attorney arrived to assist with Durable power of attorney and medical power of attorney.   Total time:  1130 am - 1220 pm  Traves Majchrzak Anita. [name], MD MBA The Palliative Medicine Team at [facility] Phone: [phone] Pager: [phone]

## 2013-09-06 NOTE — Addendum Note (Signed)
Encounter addended by: Elaina Hoops on: 09/06/2013  3:04 PM<BR>     Documentation filed: Inpatient Document Flowsheet

## 2013-09-06 NOTE — Progress Notes (Signed)
[facility] Radiation Oncology Dept Therapy Treatment Record Phone [phone]   Radiation Therapy was administered to Anita Gardner on: 09/06/2013  1:23 PM and was treatment # 1 out of a planned course of 10 treatments.

## 2013-09-07 ENCOUNTER — Ambulatory Visit
Admit: 2013-09-07 | Discharge: 2013-09-07 | Disposition: A | Discharge status: Home / Self Care | Payer: Medicaid Other | Attending: Radiation Oncology | Admitting: Radiation Oncology

## 2013-09-07 DIAGNOSIS — C7931 Secondary malignant neoplasm of brain: Principal | ICD-10-CM

## 2013-09-07 DIAGNOSIS — C801 Malignant (primary) neoplasm, unspecified: Secondary | ICD-10-CM

## 2013-09-07 DIAGNOSIS — Z515 Encounter for palliative care: Secondary | ICD-10-CM

## 2013-09-07 DIAGNOSIS — C7949 Secondary malignant neoplasm of other parts of nervous system: Secondary | ICD-10-CM

## 2013-09-07 DIAGNOSIS — S01502A Unspecified open wound of oral cavity, initial encounter: Secondary | ICD-10-CM

## 2013-09-07 DIAGNOSIS — C3490 Malignant neoplasm of unspecified part of unspecified bronchus or lung: Secondary | ICD-10-CM

## 2013-09-07 LAB — GLUCOSE, CAPILLARY
Glucose-Capillary: 306 mg/dL — ABNORMAL HIGH (ref 70–99)
Glucose-Capillary: 224 mg/dL — ABNORMAL HIGH (ref 70–99)

## 2013-09-07 MED ORDER — LIDOCAINE VISCOUS 2 % MT SOLN
15.0000 mL | OROMUCOSAL | Status: DC | PRN
  Administered 2013-09-07 – 2013-09-13 (×8): 15 mL via OROMUCOSAL
  Filled 2013-09-07 (×7): qty 15

## 2013-09-07 MED ORDER — NYSTATIN 100000 UNIT/ML MT SUSP
5.0000 mL | Freq: Four times a day (QID) | OROMUCOSAL | Status: DC
  Administered 2013-09-07 – 2013-09-14 (×27): 500000 [IU] via ORAL
  Filled 2013-09-07 (×31): qty 5

## 2013-09-07 MED ORDER — INSULIN DETEMIR 100 UNIT/ML ~~LOC~~ SOLN
20.0000 [IU] | Freq: Every day | SUBCUTANEOUS | Status: DC
  Filled 2013-09-07: qty 0.2

## 2013-09-07 NOTE — Care Management Note (Signed)
    Page 1 of 1   09/07/2013     11:39:13 AM   CARE MANAGEMENT NOTE 09/07/2013  Patient:  Anita Gardner, Anita Gardner   Account Number:  [id number]  Date Initiated:  09/07/2013  Documentation initiated by:  Lanier Clam  Subjective/Objective Assessment:   57 Y/O F ADMITTED W/BRAIN METS. LUNG CA.     Action/Plan:   FROM HOME.   Anticipated DC Date:  09/10/2013   Anticipated DC Plan:  HOME/SELF CARE      DC Planning Services  CM consult      Choice offered to / List presented to:             Status of service:  In process, will continue to follow Medicare Important Message given?   (If response is "NO", the following Medicare IM given date fields will be blank) Date Medicare IM given:   Date Additional Medicare IM given:    Discharge Disposition:    Per UR Regulation:  Reviewed for med. necessity/level of care/duration of stay  If discussed at Long Length of Stay Meetings, dates discussed:    Comments:  09/07/13 Caryle Helgeson RN,BSN NCM [phone] RADIATION,IV DECADRON,IV DILAUDID PRN.PALLIATIVE-DNR.D/C PLAN HOME.

## 2013-09-07 NOTE — Progress Notes (Signed)
Upon thinking about patient's tongue lesions further, these could certainly be due to biting her tongue if she actually had a seizure prior to hospitalization.  This decreases the likelihood of a malignancy over her tongue.  Of note, her tongue has not be irradiated - this is not RT mucositis.  I still think this needs to be followed, and continue antifungals/MMW.  -----------------------------------  Lonie Peak, MD

## 2013-09-07 NOTE — Discharge Summary (Signed)
  Date: 09/07/2013  Patient name: Anita Gardner  Medical record number: [id number]  Date of birth: 04-14-1956   This patient has been seen and the plan of care was discussed with the house staff. Please see their note for complete details. I concur with their findings and plan.  Jonah Blue, DO, FACP Faculty [facility] Internal Medicine Residency Program 09/07/2013, 4:08 PM

## 2013-09-07 NOTE — Progress Notes (Addendum)
Patient Anita Gardner      DOB: 11-Oct-1956      VWU:[id number]   Palliative Medicine Team at [facility] Progress Note    Subjective: Patient with significant tongue pain. Lidocaine relieve symptoms. She has no appetite at this time related to this discomfort. She did cut down for her radiation. She remains intermittently disoriented to time. She appears more awake today. Her daughter reports increased urination which may be secondary to IV fluids and elevated blood glucose. Urinalysis on 09/05/2013 was negative. Continue to offer reassurance  Filed Vitals:   09/07/13 2135  BP: 148/98  Pulse: 93  Temp: 97.5 F (36.4 C)  Resp: 20   Physical exam:  General: No acute distress easily frustrated Pupils equal round reactive to light extraocular muscles appear intact his membranes are moist she is a large wound to her right sided time which appears to be secondary to being possibly bitten during her fall. She does not appear to have thrush. Chest is decreased but clear to auscultation his blockers or wheezes Cardiovascular is irregular rate and rhythm positive S1 and S2 no S3-S4 murmurs or gallops abdomen is obese soft nontender nondistended Extremities trace edema nonpitting Neurologically she is oriented to self general issues and place, but does get time and events confused    Assessment and plan: Patient is a 57 year old white female with stage IV lung cancer, with multiple brain metastases. Patient is undergoing radiation treatment to the brain. Her course is been complicated by a necrotic lesion of the tongue which appears to be secondary to being bitten, and confusion.  1. DO NOT RESUSCITATE code on the chart  2. Tongue lesion: Magic mouthwash was initiated yesterday and lidocaine has been added. Caution should be used with the lidocaine as it may interfere with swallowing. 3. Increased urination likely secondary to IV fluids and increased blood sugars. If she develops fever  please consider rechecking a urinalysis. Her initial sample was negative  4. Current goals are to continue with her radiation treatment. I will continue to discuss goals with the patient and family as we proceed along this course. She will likely need a residential hospice placement if her condition worsens. Will broach the subject of long-term disposition.  5. Seizure prophylaxis : Agree with the Keppra as I suspect that she probably had a seizure which caused her to fall and may have resulted in a tongue bite 6. Brain lesions continue Decadron 7. Hyperglycemia secondary to steroids continue with insulin her primary service.   Total time 15 minutes    Dorr Perrot L. [name], MD MBA The Palliative Medicine Team at [facility] Phone: [phone] Pager: [phone]

## 2013-09-07 NOTE — Progress Notes (Signed)
Speech Language Pathology Treatment:    Patient Details Name: Anita Gardner MRN: [id number] DOB: [birth year] Today's Date: 09/07/2013 Time: 4098-1191 SLP Time Calculation (min): 32 min  Assessment / Plan / Recommendation Clinical Impression  Pt. with 2 very large and painful ulcerations on right side of tongue, possibly secondary to radiation vs. ? of pt. biting her tongue when she fell.  RN coated tongue with lidocaine approximately 25 minutes ago, and this has reduced pt's pain significantly (previously was painful to eat or drink).  Pt. took sips of thin liquids from cup and swallowed in a timely manner, without overt s/s of aspiration.  Pt. does desire at this time to d/c thick liquids, and appears safe to do so.   HPI HPI: 57 year old woman with history of small cell lung cancer (Stage IIIA, T1bN2MX at diagnosis s/p radiation and chemo (carboplatin, etoposide, neulasta x 5 cycles; finished on 06/20/13) with multiple brain mets seen on 10/8 MRI), atrial fibrillation (on metoprolol and Xarelto), DM2, HTN, COPD who presented to ED after a fall.  Patient states that this morning 10/13, she was sitting in her recliner when she slid off of it and did not have the strength to pull herself back up. Patient states she did not lose consciousness or hit or head.  On ROS, decreased appetite but no difficulty swallowing; occasional nausea but no vomiting. Denies fever, chest pain, palpitations, shortness of breath, cough, abdominal pain, diarrhea. She did recently start smoking again after hearing news about her multiple brain metastases discovered on MRI brain on 08/29/13, unclear how much.  CXR Left base atelectasis or pneumonia with small pleural effusion.   Additional PMH:  GERD, bipolar, COPD, HTN, DM   Pertinent Vitals Pt. Afebrile.  SLP Plan  Discharge SLP treatment due to (comment) (goals met)    Recommendations Liquids provided via: Cup;Straw Medication Administration: Whole meds with  puree Supervision: Patient able to self feed;Intermittent supervision to cue for compensatory strategies Compensations: Slow rate;Small sips/bites;Check for pocketing Postural Changes and/or Swallow Maneuvers: Seated upright 90 degrees;Upright 30-60 min after meal              Oral Care Recommendations: Oral care Q4 per protocol Plan: Discharge SLP treatment due to (comment) (goals met)    GO     Maryjo Rochester T 09/07/2013, 4:32 PM

## 2013-09-07 NOTE — Progress Notes (Signed)
   Weekly Management Note:  Inpatient Current Dose:  3 Gy  Projected Dose: 30 Gy   Narrative:  The patient presents for routine under treatment assessment.  CBCT/MVCT images/Port film x-rays were reviewed.  The chart was checked. Mrs. Dandy refuse treatment yesterday, wished to speak to me, however I was not in the office. I met with her before she received her 1st fraction of radiotherapy today. She appears more confused than her baseline. She thinks she received treatment yesterday. I think she may be getting confused about the CT simulation that was done earlier this week to plan her radiotherapy, but she had not actually received a therapeutic fraction yet. She reports that she has a sore on the side of her right tongue. She states no additional questions or concerns today. She expresses desire to start radiotherapy.  Physical Findings:  height is 5\' 5"  (1.651 m) and weight is 182 lb 1.6 oz (82.6 kg). Her oral temperature is 98.5 F (36.9 C). Her blood pressure is 128/89 and her pulse is 87. Her respiration is 18 and oxygen saturation is 97%.  right lateral tongue demonstrates two ulcerated lesions - these actually appear more consistent with cancer than thrush.   Impression:  The patient  tolerated her first fraction of radiotherapy today; she has  new suspicious tongue lesions   Plan:  Continue radiotherapy as planned. I spoke with Dr. Rhona Leavens of the inpatient team and asked him to start the patient on antifungal medication, preferably fluconazole since this is compatible with her other medications, otherwise she should have a relatively concentrated regimen of nystatin in addition to her Magic mouthwash. Her tongue lesion will need to be followed by her outpatient oncologists and as a team we may need to decide if biopsy or ENT referral is warranted if it progresses - otherwise best supportive care may be warranted, depending on her goals of care and how her clinical status evolves.  I talked to  him about the patient's increased confusion. This could be due to her brain metastases, or her medications such as her dexamethasone; probably it is multifactorial. Appreciate the unchanged CT scan of her head from yesterday. If and when she is discharged home, she will need supervision due to her confusion.  ________________________________   Lonie Peak, M.D.

## 2013-09-07 NOTE — Consult Note (Signed)
Anita Gardner      DOB: [birth year]      VWU:[id number]     Consult Note from the Palliative Medicine Team at [facility]    Consult Requested by: Dr. Dennie Maizes, MD Reason for Consultation:GOC Phone [phone] Related symptom rec Assessment of patients Current state: This is the second part of a two part goals of care care with Anita Gardner. My initial interaction was on the day that she was literally being loaded onto the stretcher to transfer to [facility] hospital .  Please see my note from the day of interaction.  Anita gave me permission to speak with her children and stated that she would want them to assist with any and all decisions.  I met with [name] and Sam and reviewed their mom's medical condition and some of their options for palliation.  I subsequently was able to meet with Anita Gardner today. She is intermittently sleepy but appears to have capacity for decision making as she is able to tell me where  She is, the month and year and was able to tell me about her understanding of her prognosis.  She was able to vocalize that she desires her daughter and son to act as Durable and Medical Power of attorney which is consistent with her request on the day prior.  She was able to articulate that she wants to try radiation to see if it will improve the clarity of her mind so that she can interact with her family.  She would like her symptoms aggressively managed and in articular mentioned continue pain of her tongue where it appears that she may have bitten the right side leaving a significant necrotic lesion.  The family has requested an attorney to assist with drafting her POA papers who arrived as I was finishing.   Goals of Care: 1.  Code Status: DNR   2. Scope of Treatment: Continue current curative treatments- antiseizure medications, cardizem, insulin, treatment for pneumonia if needed. 4. Disposition: to be determined , Anita lives in her own home  alone   3. Symptom Management:   1.  Tongue laceration with pain:  Magic mouthwash was ordered, may need lidocaine but held off out of concern for impairing swalowing.  We may have no choice. 2.  Cough: continue prn meds 3.  Hyperglycemia related to steroids: continue medications per primary team  4. Psychosocial:  Anita has been being cared for by her mother with assistance occasionally by children [place] and Sam.  Unfortunately, both children work and have had to rely on their grandmother's assistance.  5. 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        Anita Documents Completed or Given: Document Given Completed  Advanced Directives Pkt    MOST    DNR    Gone from My Sight    Hard Choices      Brief HPI:  57 yr old white female with known stage III lung cancer diagnosed on Friday prior to admission with brain mets.  Anita was found down on the floor at home confused.  We were asked to assist with GOC.    ROS: intermittent headache,  Otherwise Anita denies- shortness of breath, nausea, vomiting,  She does have cough. Tongue pain   PMH:  Past Medical History  Diagnosis Date  . Chronic pain of left knee     secondary to arthritis  . Hypertension   . GERD (gastroesophageal reflux disease)   .  Constipation   . Anxiety   . Depression   . Panic attacks   . Bipolar 1 disorder   . COPD (chronic obstructive pulmonary disease)   . Status post chemotherapy 03/01/2013-5/22/2014concurrent with Radiation Therapy    carboplatin for AUC of 5 on day 1 and etoposide 120 mg/M2 days 1, 2 and 3 with Neulasta support on day 4. The Anita is status post 4 cycles. This is concurrent with radiation.  . S/P radiation therapy 03/01/2013-04/12/2013    Left Lung and Nodes/ 62 Gy in 31 fractions  . High cholesterol   . Atrial fibrillation   . Diabetes mellitus type 2, uncontrolled, without complications DX: 2000    on oral medications only  . Diabetic  peripheral neuropathy   . Anemia   . History of blood transfusion     "twice w/cancer treatments" (09/03/2013)  . Incontinence of urine   . 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 Tyrone Sage (CVTS), Dr. Gwenyth Bouillon (Onc) // LLL mass noted 11/2012// PET scan 12/2012 - mediastinal + left infrahilar nodal metastasis AND low left mediastinum or posterior left pericardium is hypermetabolic likely nodal metastasis // Stage IIIA, T1bN2MX at diagnosis // Bronchoscopy 01/2013 confirmed small cell ca. // Rad tx 04-06/14 // chemo started 04/14  . Brain metastases     /notes 09/03/2013     PSH: Past Surgical History  Procedure Laterality Date  . Skin graft Left     as a child; "bruise that never went away;Dr. Nonie Hoyer did OR; skin graft to leg" (09/03/2013)   . Video bronchoscopy with endobronchial ultrasound N/A 02/14/2013    Procedure: VIDEO BRONCHOSCOPY WITH ENDOBRONCHIAL ULTRASOUND;  Surgeon: Delight Ovens, MD;  Location: [facility] OR;  Service: Thoracic;  Laterality: N/A;   I have reviewed the FH and SH and  If appropriate update it with new information. Allergies  Allergen Reactions  . Ampicillin Hives  . Codeine Nausea And Vomiting        Scheduled Meds: . dexamethasone  10 mg Intravenous Q24H  . diltiazem  60 mg Oral Q12H  . famotidine  20 mg Oral BID  . insulin aspart  0-15 Units Subcutaneous TID WC  . insulin aspart  0-5 Units Subcutaneous QHS  . insulin detemir  15 Units Subcutaneous Daily  . levETIRAcetam  500 mg Oral BID  . magic mouthwash  5 mL Oral TID  . magnesium oxide  400 mg Oral BID  . metoprolol tartrate  25 mg Oral BID  . nicotine  14 mg Transdermal Daily  . Rivaroxaban  20 mg Oral Q supper  . sodium chloride  3 mL Intravenous Q12H   Continuous Infusions: . sodium chloride 20 mL/hr (09/06/13 0058)   PRN Meds:.HYDROmorphone (DILAUDID) injection, oxyCODONE-acetaminophen, RESOURCE THICKENUP CLEAR    BP 134/88  Pulse 87  Temp(Src) 97.7 F (36.5 C)  (Oral)  Resp 20  Ht 5\' 5"  (1.651 m)  Wt 82.6 kg (182 lb 1.6 oz)  BMI 30.3 kg/m2  SpO2 94%   PPS: 40 %   Intake/Output Summary (Last 24 hours) at 09/07/13 1610 Last data filed at 09/07/13 9604  Gross per 24 hour  Intake    480 ml  Output    450 ml  Net     30 ml    Physical Exam:  General: Overweight white female, smiling but sleepy if left unstimulated, no acute distress HEENT: PERRL, mmm dry, necrotic tongue mass right tongue  Chest:   Decreased but clear  no rhonchi, rales or wheezes CVS: distant but S1, S2 present irregular Abdomen:obese, soft not tender or distended Ext: trace not pitting edema, multiple areas of ecchymosis, old skin grafted lesion to left shin Neuro:oriented to her self, place, month and year, can name her children and give her own diagnosis, slight left arm paresis/weakness  Labs: CBC    Component Value Date/Time   WBC 12.2* 09/04/2013 0341   WBC 10.6* 06/04/2013 1337   RBC 4.22 09/04/2013 0341   RBC 2.48* 06/04/2013 1337   HGB 14.3 09/04/2013 0341   HGB 8.8* 06/04/2013 1337   HCT 38.7 09/04/2013 0341   HCT 25.4* 06/04/2013 1337   PLT 223 09/04/2013 0341   PLT 99* 06/04/2013 1337   MCV 91.7 09/04/2013 0341   MCV 102.6* 06/04/2013 1337   MCH 33.9 09/04/2013 0341   MCH 35.5* 06/04/2013 1337   MCHC 37.0* 09/04/2013 0341   MCHC 34.6 06/04/2013 1337   RDW 12.8 09/04/2013 0341   RDW 24.4* 06/04/2013 1337   LYMPHSABS 0.5* 06/04/2013 1337   LYMPHSABS 1.6 11/29/2012 1601   MONOABS 0.6 06/04/2013 1337   MONOABS 0.8 11/29/2012 1601   EOSABS 0.0 06/04/2013 1337   EOSABS 0.1 11/29/2012 1601   BASOSABS 0.0 06/04/2013 1337   BASOSABS 0.0 11/29/2012 1601      CMP     Component Value Date/Time   NA 136 09/04/2013 0341   NA 137 06/04/2013 1337   K 4.2 09/04/2013 0341   K 3.8 06/04/2013 1337   CL 98 09/04/2013 0341   CL 102 05/14/2013 1143   CO2 22 09/04/2013 0341   CO2 23 06/04/2013 1337   GLUCOSE 448* 09/04/2013 0341   GLUCOSE 142* 06/04/2013 1337   GLUCOSE 173*  05/14/2013 1143   BUN 22 09/04/2013 0341   BUN 5.7* 06/04/2013 1337   CREATININE 0.55 09/04/2013 0341   CREATININE 0.63 07/19/2013 1142   CREATININE 0.6 06/04/2013 1337   CALCIUM 9.3 09/04/2013 0341   CALCIUM 9.3 06/04/2013 1337   PROT 7.2 09/03/2013 1652   PROT 6.5 06/04/2013 1337   ALBUMIN 3.9 09/03/2013 1652   ALBUMIN 3.6 06/04/2013 1337   AST 24 09/03/2013 1652   AST 20 06/04/2013 1337   ALT 16 09/03/2013 1652   ALT 40 06/04/2013 1337   ALKPHOS 64 09/03/2013 1652   ALKPHOS 97 06/04/2013 1337   BILITOT 1.2 09/03/2013 1652   BILITOT 0.44 06/04/2013 1337   GFRNONAA >90 09/04/2013 0341   GFRAA >90 09/04/2013 0341    Chest Xray Reviewed/Impressions: left lower lobe post radiation changes , infection possible   CT scan of the Head Reviewed/Impressions: Extensive Metastatic disease to the brain, no acute events    Time In Time Out Total Time Spent with Anita Total Overall Time  1130 am 1220 am 50 min 50 min    Greater than 50%  of this time was spent counseling and coordinating care related to the above assessment and plan.    Lyne Khurana L. [name], MD MBA The Palliative Medicine Team at [facility] Phone: [phone] Pager: [phone]

## 2013-09-07 NOTE — Progress Notes (Signed)
Inpatient Diabetes Program Recommendations  AACE/ADA: New Consensus Statement on Inpatient Glycemic Control (2013)  Target Ranges:  Prepandial:   less than 140 mg/dL      Peak postprandial:   less than 180 mg/dL (1-2 hours)      Critically ill patients:  140 - 180 mg/dL   Reason for Visit: Hyperglycemia  A1C 9.9 in 03/2013; only on oral agents metformin and glyburide at home. Pt now on steroids so BGs likely more difficult to control.   Results for KERYL, GHOLSON (MRN [id number]) as of 09/07/2013 10:52  Ref. Range 09/06/2013 07:47 09/06/2013 11:28 09/06/2013 17:06 09/06/2013 21:12 09/07/2013 07:54  Glucose-Capillary Latest Range: 70-99 mg/dL 409 (H) 811 (H) 914 (H) 252 (H) 306 (H)   Increase Levemir to 20 units QD. May benefit from meal coverage insulin if post-prandial blood sugars remain >200 mg/dL. Add Novolog 3 units tidwc if pt eats >50% meal.  Will continue to follow. Thank you. Ailene Ards, RD, LDN, CDE Inpatient Diabetes Coordinator [phone]

## 2013-09-07 NOTE — Progress Notes (Signed)
Simulation Verification Note Inpatient Brain  The patient was brought to the treatment unit and placed in the planned treatment position. The clinical setup was verified. Then port films were obtained and uploaded to the radiation oncology medical record software.  The treatment beams were carefully compared against the planned radiation fields. The position location and shape of the radiation fields was reviewed. They targeted volume of tissue appears to be appropriately covered by the radiation beams. Organs at risk appear to be excluded as planned.  Based on my personal review, I approved the simulation verification. The patient's treatment will proceed as planned.  -----------------------------------  Lonie Peak, MD

## 2013-09-07 NOTE — Progress Notes (Signed)
[facility] PROGRESS NOTE  Anita Gardner ZHY:[id number] DOB: [birth year] DOA: 09/03/2013 PCP: Windell Hummingbird, MD  Assessment/Plan: #Brain metastasis - Cont Decadron IV 10 mg q24h  - Palliative care consult note reviewed - Keppra- 1000mg  loading dose IV yesterday. Then continue with 500mg  BID. - Awaiting initial rad tx - pt refused 10/16 initial treatment - Will follow #Atrial fibrillation without RVR- - Currently rate controlled - Now off cardizem gtt to scheduled q12h cardizem  - Cont metoprolol 25 mg PO BID  - continue Xarelto for stroke prevention #DM2- A1C 9.9 in 03/2013; only on oral agents metformin and glyburide at home. Pt now on steroids so BGs likely more difficult to control.  -Cont SSI moderate  -Increase Levemir to 20u daily, while on decadron.  -CBGs with meals and qhs  #Hypertension- Stable . Will Cont metoprolol as well as diltiazem per above.  #Tobacco abuse- pt had quit smoking in 03/2013 but started smoking again in past few days after finding out about brain metastases.  -nicotine patch   Code Status: Full Family Communication: Pt in room (indicate person spoken with, relationship, and if by phone, the number) Disposition Plan: Pending   Consultants:  Palliative Care  Radiation Oncology  Procedures:  Radiation treatments  HPI/Subjective: No complaints. Pt refused initial radiation tx yesterday.  Objective: Filed Vitals:   09/06/13 2117 09/07/13 0500 09/07/13 1036 09/07/13 1037  BP: 147/93 134/88 140/82   Pulse: 90 87  82  Temp: 97.7 F (36.5 C) 97.7 F (36.5 C)    TempSrc: Oral Oral    Resp: 16 20    Height:      Weight:      SpO2: 97% 94%      Intake/Output Summary (Last 24 hours) at 09/07/13 1040 Last data filed at 09/07/13 0930  Gross per 24 hour  Intake    360 ml  Output    550 ml  Net   -190 ml   Filed Weights   09/03/13 2130 09/05/13 1525  Weight: 79.788 kg (175 lb 14.4 oz) 82.6 kg (182 lb 1.6 oz)     Exam:   General:  Awake, in nad  Cardiovascular: regular, s1, s2  Respiratory: normal resp effort, no wheezing  Abdomen: soft, nondistended  Musculoskeletal: perfused, no clubbing   Data Reviewed: Basic Metabolic Panel:  Recent Labs Lab 09/03/13 1652 09/03/13 1700 09/04/13 0341  NA 133* 135 136  K 3.7 4.3 4.2  CL 97 100 98  CO2 23  --  22  GLUCOSE 330* 323* 448*  BUN 20 27* 22  CREATININE 0.60 0.90 0.55  CALCIUM 10.0  --  9.3   Liver Function Tests:  Recent Labs Lab 09/03/13 1652  AST 24  ALT 16  ALKPHOS 64  BILITOT 1.2  PROT 7.2  ALBUMIN 3.9   No results found for this basename: LIPASE, AMYLASE,  in the last 168 hours No results found for this basename: AMMONIA,  in the last 168 hours CBC:  Recent Labs Lab 09/03/13 1645 09/03/13 1700 09/04/13 0341  WBC 13.2*  --  12.2*  HGB 16.1* 17.0* 14.3  HCT 44.6 50.0* 38.7  MCV 91.8  --  91.7  PLT 225  --  223   Cardiac Enzymes: No results found for this basename: CKTOTAL, CKMB, CKMBINDEX, TROPONINI,  in the last 168 hours BNP (last 3 results) No results found for this basename: PROBNP,  in the last 8760 hours CBG:  Recent Labs Lab 09/06/13 0747 09/06/13 1128 09/06/13 1706  09/06/13 2112 09/07/13 0754  GLUCAP 342* 391* 213* 252* 306*    Recent Results (from the past 240 hour(s))  URINE CULTURE     Status: None   Collection Time    09/03/13  5:28 PM      Result Value Range Status   Specimen Description URINE, CLEAN CATCH   Final   Special Requests NONE   Final   Culture  Setup Time     Final   Value: 09/03/2013 22:09     Performed at [facility] Count     Final   Value: >=100,000 COLONIES/ML     Performed at [facility]   Culture     Final   Value: Multiple bacterial morphotypes present, none predominant. Suggest appropriate recollection if clinically indicated.     Performed at [facility]   Report Status 09/05/2013 FINAL   Final     Studies: Dg  Chest 2 View  09/05/2013   CLINICAL DATA:  Cough and congestion.  History of lung cancer.  EXAM: CHEST  2 VIEW  COMPARISON:  CHEST x-ray 09/04/2013.  Prior CT examinations.  FINDINGS: Ill-defined interstitial prominence throughout the left lower lobe, likely to represent evolving post radiation changes. No definite acute consolidative airspace disease. Small left pleural effusion. No evidence of pulmonary edema. Heart size is mildly enlarged (unchanged). Upper mediastinal contours are within normal limits. Atherosclerosis in the thoracic aorta.  IMPRESSION: 1. The appearance of the chest is similar to prior studies, favored to reflect evolving postradiation changes in the lower left hemithorax. Strictly speaking, left lower lobe bronchopneumonia is difficult to entirely exclude, but is not strongly favored. Clinical correlation is recommended.   Electronically Signed   By: Trudie Reed M.D.   On: 09/05/2013 12:43   Ct Head Wo Contrast  09/06/2013   CLINICAL DATA:  Altered mental status. Lung cancer with known brain metastases.  EXAM: CT HEAD WITHOUT CONTRAST  TECHNIQUE: Contiguous axial images were obtained from the base of the skull through the vertex without intravenous contrast.  COMPARISON:  Head CT scan 09/03/2013 and brain MRI 08/29/2013.  FINDINGS: Scattered metastatic deposits in the brain are again seen and unchanged. No hemorrhage, hydrocephalus, midline shift, acute infarction or abnormal extra-axial fluid collection is identified the calvarium is intact.  IMPRESSION: No acute finding in patient with extensive metastatic disease to the brain. Stable compared to the examination 3 days ago.   Electronically Signed   By: Drusilla Kanner M.D.   On: 09/06/2013 14:57    Scheduled Meds: . dexamethasone  10 mg Intravenous Q24H  . diltiazem  60 mg Oral Q12H  . famotidine  20 mg Oral BID  . insulin aspart  0-15 Units Subcutaneous TID WC  . insulin aspart  0-5 Units Subcutaneous QHS  . insulin  detemir  15 Units Subcutaneous Daily  . levETIRAcetam  500 mg Oral BID  . magic mouthwash  5 mL Oral TID  . magnesium oxide  400 mg Oral BID  . metoprolol tartrate  25 mg Oral BID  . nicotine  14 mg Transdermal Daily  . Rivaroxaban  20 mg Oral Q supper  . sodium chloride  3 mL Intravenous Q12H   Continuous Infusions: . sodium chloride 20 mL/hr (09/06/13 0058)    Principal Problem:   Brain metastases Active Problems:   Diabetes mellitus type 2, uncontrolled, without complications   Small cell lung carcinoma   Atrial fibrillation    Time spent:  Terry Bolotin K  [facility] Pager [phone]. If 7PM-7AM, please contact night-coverage at [email or web address], password [facility] 09/07/2013, 10:40 AM  LOS: 4 days

## 2013-09-08 DIAGNOSIS — R5381 Other malaise: Secondary | ICD-10-CM

## 2013-09-08 DIAGNOSIS — I10 Essential (primary) hypertension: Secondary | ICD-10-CM

## 2013-09-08 LAB — CBC WITH DIFFERENTIAL/PLATELET
Basophils Absolute: 0 10*3/uL (ref 0.0–0.1)
Basophils Relative: 0 % (ref 0–1)
Eosinophils Relative: 0 % (ref 0–5)
HCT: 42.3 % (ref 36.0–46.0)
Hemoglobin: 15.2 g/dL — ABNORMAL HIGH (ref 12.0–15.0)
Lymphocytes Relative: 4 % — ABNORMAL LOW (ref 12–46)
MCV: 91.8 fL (ref 78.0–100.0)
Monocytes Absolute: 0.3 10*3/uL (ref 0.1–1.0)
Neutro Abs: 8.1 10*3/uL — ABNORMAL HIGH (ref 1.7–7.7)
Platelets: 215 10*3/uL (ref 150–400)
RDW: 12.5 % (ref 11.5–15.5)
WBC: 8.8 10*3/uL (ref 4.0–10.5)

## 2013-09-08 LAB — GLUCOSE, CAPILLARY
Glucose-Capillary: 311 mg/dL — ABNORMAL HIGH (ref 70–99)
Glucose-Capillary: 309 mg/dL — ABNORMAL HIGH (ref 70–99)

## 2013-09-08 MED ORDER — FLUCONAZOLE 100 MG PO TABS
100.0000 mg | ORAL_TABLET | Freq: Every day | ORAL | Status: DC
  Administered 2013-09-09 – 2013-09-14 (×6): 100 mg via ORAL
  Filled 2013-09-08 (×6): qty 1

## 2013-09-08 MED ORDER — FLUCONAZOLE 200 MG PO TABS
200.0000 mg | ORAL_TABLET | Freq: Once | ORAL | Status: AC
  Administered 2013-09-08: 200 mg via ORAL
  Filled 2013-09-08 (×2): qty 1

## 2013-09-08 MED ORDER — INSULIN DETEMIR 100 UNIT/ML ~~LOC~~ SOLN
30.0000 [IU] | Freq: Every day | SUBCUTANEOUS | Status: DC
  Administered 2013-09-08 – 2013-09-11 (×4): 30 [IU] via SUBCUTANEOUS
  Filled 2013-09-08 (×5): qty 0.3

## 2013-09-08 NOTE — Progress Notes (Signed)
[facility] PROGRESS NOTE  Anita Gardner [id number]:[id number] DOB: [birth year] DOA: 09/03/2013 PCP: Windell Hummingbird, MD  Assessment/Plan: #Brain metastasis - Cont on Decadron IV 10 mg q24h  - Palliative care consult note reviewed - Keppra- 1000mg  loading dose IV yesterday. Then continue with 500mg  BID. - s/p first radiation tx 09/07/13 - Will follow #Atrial fibrillation without RVR- - Currently rate controlled - On scheduled q12h PO cardizem  - Cont metoprolol 25 mg PO BID  - continue Xarelto for stroke prevention #DM2- A1C 9.9 in 03/2013; only on oral agents metformin and glyburide at home. Pt now on steroids so BGs likely more difficult to control.  - Glucose in the 300 range -Cont SSI moderate  -Increase Levemir to 30u daily, while on decadron.  -CBGs with meals and qhs  #Hypertension- Stable . Will Cont metoprolol as well as diltiazem per above.  #Tobacco abuse- pt had quit smoking in 03/2013 but started smoking again in past few days after finding out about brain metastases.  -nicotine patch  #Tongue lesion - given immunosuppressed state, may be oral candidiasis. Anifungals ordered. However, there is high concern for possible metastatic disease to the tongue. This was discussed with Radiation Oncology yesterday. See Rad Onc note from yesterday. Plan for empiric antifungal coverage for now with very close outpatient follow-up re: tongue lesion upon discharge  Code Status: Full Family Communication: Pt in room (indicate person spoken with, relationship, and if by phone, the number) Disposition Plan: Pending   Consultants:  Palliative Care  Radiation Oncology  Procedures:  Radiation treatments  HPI/Subjective: No acute events noted overnight. Continues to complain of tongue pain.  Objective: Filed Vitals:   09/07/13 1037 09/07/13 1337 09/07/13 2135 09/08/13 0535  BP:  128/89 148/98 139/98  Pulse: 82 87 93 85  Temp:  98.5 F (36.9 C) 97.5 F (36.4 C) 97.7 F  (36.5 C)  TempSrc:  Oral Oral Oral  Resp:  18 20 20   Height:      Weight:      SpO2:  97% 98% 98%    Intake/Output Summary (Last 24 hours) at 09/08/13 0827 Last data filed at 09/07/13 1952  Gross per 24 hour  Intake    360 ml  Output    251 ml  Net    109 ml   Filed Weights   09/03/13 2130 09/05/13 1525  Weight: 79.788 kg (175 lb 14.4 oz) 82.6 kg (182 lb 1.6 oz)    Exam:   General:  Awake, in nad  Cardiovascular: regular, s1, s2  Respiratory: normal resp effort, no wheezing  Abdomen: soft, nondistended  Musculoskeletal: perfused, no clubbing   Data Reviewed: Basic Metabolic Panel:  Recent Labs Lab 09/03/13 1652 09/03/13 1700 09/04/13 0341  NA 133* 135 136  K 3.7 4.3 4.2  CL 97 100 98  CO2 23  --  22  GLUCOSE 330* 323* 448*  BUN 20 27* 22  CREATININE 0.60 0.90 0.55  CALCIUM 10.0  --  9.3   Liver Function Tests:  Recent Labs Lab 09/03/13 1652  AST 24  ALT 16  ALKPHOS 64  BILITOT 1.2  PROT 7.2  ALBUMIN 3.9   No results found for this basename: LIPASE, AMYLASE,  in the last 168 hours No results found for this basename: AMMONIA,  in the last 168 hours CBC:  Recent Labs Lab 09/03/13 1645 09/03/13 1700 09/04/13 0341 09/08/13 0438  WBC 13.2*  --  12.2* 8.8  NEUTROABS  --   --   --  8.1*  HGB 16.1* 17.0* 14.3 15.2*  HCT 44.6 50.0* 38.7 42.3  MCV 91.8  --  91.7 91.8  PLT 225  --  223 215   Cardiac Enzymes: No results found for this basename: CKTOTAL, CKMB, CKMBINDEX, TROPONINI,  in the last 168 hours BNP (last 3 results) No results found for this basename: PROBNP,  in the last 8760 hours CBG:  Recent Labs Lab 09/07/13 0754 09/07/13 1145 09/07/13 1710 09/07/13 2131 09/08/13 0754  GLUCAP 306* 283* 224* 202* 311*    Recent Results (from the past 240 hour(s))  URINE CULTURE     Status: None   Collection Time    09/03/13  5:28 PM      Result Value Range Status   Specimen Description URINE, CLEAN CATCH   Final   Special Requests  NONE   Final   Culture  Setup Time     Final   Value: 09/03/2013 22:09     Performed at [facility] Count     Final   Value: >=100,000 COLONIES/ML     Performed at [facility]   Culture     Final   Value: Multiple bacterial morphotypes present, none predominant. Suggest appropriate recollection if clinically indicated.     Performed at [facility]   Report Status 09/05/2013 FINAL   Final     Studies: Ct Head Wo Contrast  09/06/2013   CLINICAL DATA:  Altered mental status. Lung cancer with known brain metastases.  EXAM: CT HEAD WITHOUT CONTRAST  TECHNIQUE: Contiguous axial images were obtained from the base of the skull through the vertex without intravenous contrast.  COMPARISON:  Head CT scan 09/03/2013 and brain MRI 08/29/2013.  FINDINGS: Scattered metastatic deposits in the brain are again seen and unchanged. No hemorrhage, hydrocephalus, midline shift, acute infarction or abnormal extra-axial fluid collection is identified the calvarium is intact.  IMPRESSION: No acute finding in patient with extensive metastatic disease to the brain. Stable compared to the examination 3 days ago.   Electronically Signed   By: Drusilla Kanner M.D.   On: 09/06/2013 14:57    Scheduled Meds: . dexamethasone  10 mg Intravenous Q24H  . diltiazem  60 mg Oral Q12H  . famotidine  20 mg Oral BID  . insulin aspart  0-15 Units Subcutaneous TID WC  . insulin aspart  0-5 Units Subcutaneous QHS  . insulin detemir  20 Units Subcutaneous Daily  . levETIRAcetam  500 mg Oral BID  . magic mouthwash  5 mL Oral TID  . magnesium oxide  400 mg Oral BID  . metoprolol tartrate  25 mg Oral BID  . nystatin  5 mL Oral QID  . Rivaroxaban  20 mg Oral Q supper  . sodium chloride  3 mL Intravenous Q12H   Continuous Infusions: . sodium chloride 20 mL/hr (09/06/13 0058)    Principal Problem:   Brain metastases Active Problems:   Diabetes mellitus type 2, uncontrolled, without  complications   Small cell lung carcinoma   Atrial fibrillation  Time spent:  CHIU, STEPHEN K  [facility] Pager [phone]. If 7PM-7AM, please contact night-coverage at [email or web address], password [facility] 09/08/2013, 8:27 AM  LOS: 5 days

## 2013-09-09 DIAGNOSIS — C7931 Secondary malignant neoplasm of brain: Secondary | ICD-10-CM

## 2013-09-09 DIAGNOSIS — C3490 Malignant neoplasm of unspecified part of unspecified bronchus or lung: Secondary | ICD-10-CM

## 2013-09-09 DIAGNOSIS — E1165 Type 2 diabetes mellitus with hyperglycemia: Secondary | ICD-10-CM

## 2013-09-09 DIAGNOSIS — C7949 Secondary malignant neoplasm of other parts of nervous system: Secondary | ICD-10-CM

## 2013-09-09 DIAGNOSIS — C801 Malignant (primary) neoplasm, unspecified: Secondary | ICD-10-CM

## 2013-09-09 LAB — GLUCOSE, CAPILLARY: Glucose-Capillary: 282 mg/dL — ABNORMAL HIGH (ref 70–99)

## 2013-09-09 NOTE — Evaluation (Signed)
Physical Therapy Evaluation Patient Details Name: Anita Gardner MRN: [id number] DOB: [birth year] Today's Date: 09/09/2013 Time: 7829-5621 PT Time Calculation (min): 23 min  PT Assessment / Plan / Recommendation History of Present Illness  pt was admitted with AMS.  She has a h/o lung CA with brain mets found on 10/8  Pt had been home with intermittent assistance; had slid out of chair and mother found her on the floor.  Pt has tongue lesion which is causing her pain.  PMH significant for dm, htn, copd.  Clinical Impression  *Pt admitted with AMS, lung CA with brain mets, fall*. Pt currently with functional limitations due to the deficits listed below (see PT Problem List).  Pt will benefit from skilled PT to increase their independence and safety with mobility to allow discharge to the venue listed below.   **    PT Assessment  Patient needs continued PT services    Follow Up Recommendations  SNF;Supervision/Assistance - 24 hour    Does the patient have the potential to tolerate intense rehabilitation      Barriers to Discharge Decreased caregiver support      Equipment Recommendations  Rolling walker with 5" wheels    Recommendations for Other Services OT consult   Frequency Min 3X/week    Precautions / Restrictions Precautions Precautions: Fall Precaution Comments: pt reports 1 fall at home Restrictions Weight Bearing Restrictions: No   Pertinent Vitals/Pain *LLE and tongue pain, not rated, premedicated**      Mobility  Bed Mobility Bed Mobility: Supine to Sit Supine to Sit: 3: Mod assist;HOB elevated;With rails Details for Bed Mobility Assistance: cues for technique, assist to raise trunk Transfers Transfers: Sit to Stand;Stand to Sit;Stand Pivot Transfers Sit to Stand: 3: Mod assist Stand to Sit: 4: Min assist Stand Pivot Transfers: 4: Min assist Details for Transfer Assistance: SPT with RW, assist to rise from sitting, VCs hand  placement Ambulation/Gait Ambulation/Gait Assistance: Not tested (comment)    Exercises     PT Diagnosis: Difficulty walking;Hemiplegia non-dominant side  PT Problem List: Decreased strength;Decreased activity tolerance;Decreased balance;Decreased mobility;Decreased safety awareness;Pain PT Treatment Interventions: DME instruction;Gait training;Functional mobility training;Therapeutic activities;Therapeutic exercise;Balance training;Patient/family education     PT Goals(Current goals can be found in the care plan section) Acute Rehab PT Goals Patient Stated Goal: walk with something to hold on to PT Goal Formulation: With patient Time For Goal Achievement: 09/23/13 Potential to Achieve Goals: Good  Visit Information  Last PT Received On: 09/09/13 Assistance Needed: +1 (eob) History of Present Illness: pt was admitted with AMS.  She has a h/o lung CA with brain mets found on 10/8  Pt had been home with intermittent assistance; had slid out of chair and mother found her on the floor.  Pt has tongue lesion which is causing her pain.  PMH significant for dm, htn, copd.       Prior Functioning  Home Living Family/patient expects to be discharged to:: Skilled nursing facility Living Arrangements: Alone Home Equipment: [employer] Mor - single point Additional Comments: pt had intermittent assistance at home.   Prior Function Level of Independence: Needs assistance Gait / Transfers Assistance Needed: walked with cane Comments: lately pt has not had the energy to do what she needs to do ADL-wise.  She has had intermittent assistance at home.  When she slid out of the recliner, she did not have the strength to get back up Communication Communication: No difficulties Dominant Hand: Right    Cognition  Cognition Arousal/Alertness: Awake/alert  Behavior During Therapy: WFL for tasks assessed/performed Overall Cognitive Status: Within Functional Limits for tasks assessed    Extremity/Trunk  Assessment Upper Extremity Assessment Upper Extremity Assessment: LUE deficits/detail LUE Deficits / Details: decreased strength throughout LUE, +3/5 grossly Lower Extremity Assessment Lower Extremity Assessment: LLE deficits/detail LLE Deficits / Details: +3/5 grossly   Balance Balance Balance Assessed: Yes Static Sitting Balance Static Sitting - Balance Support: Bilateral upper extremity supported;Feet supported Static Sitting - Level of Assistance: 4: Min assist;3: Mod assist Static Sitting - Comment/# of Minutes: pt leans left requiring min to mod assist  End of Session PT - End of Session Equipment Utilized During Treatment: Gait belt Activity Tolerance: Patient tolerated treatment well Patient left: in chair;with call bell/phone within reach Nurse Communication: Mobility status  GP     Ralene Bathe Kistler 09/09/2013, 2:07 PM [phone]

## 2013-09-09 NOTE — Progress Notes (Signed)
[facility] PROGRESS NOTE  Anita Gardner ZOX:[id number] DOB: 05/04/1956 DOA: 09/03/2013 PCP: Anita Hummingbird, MD  Assessment/Plan: #Brain metastasis - Cont on Decadron IV 10 mg q24h  - Palliative care consult note reviewed - Keppra- 1000mg  loading dose IV yesterday. Then continue with 500mg  BID. - s/p first radiation tx 09/07/13 - Will follow #Atrial fibrillation without RVR- - Currently rate controlled - On scheduled q12h PO cardizem  - Cont metoprolol 25 mg PO BID  - continue Xarelto for stroke prevention #DM2- A1C 9.9 in 03/2013; only on oral agents metformin and glyburide at home. Pt now on steroids so BGs likely more difficult to control.  - Glucose improved - suspect secondary to decreased PO intake secondary to tongue lesion (see below) -Cont SSI moderate  -Increase Levemir to 30u daily, while on decadron.  -CBGs with meals and qhs  #Hypertension- Stable . Will Cont metoprolol as well as diltiazem per above.  #Tobacco abuse- pt had quit smoking in 03/2013 but started smoking again in past few days after finding out about brain metastases.  -nicotine patch  #Tongue lesion - given immunosuppressed state, may be oral candidiasis. Anifungals ordered. Also likely tongue bite s/p seizure from brain met. However, there is concern for possible metastatic disease to the tongue. This was discussed with Radiation Oncology earlier. See Rad Onc note. Plan for empiric antifungal coverage for now with very close outpatient follow-up re: tongue lesion upon discharge.  Code Status: DNR Family Communication: Pt in room (indicate person spoken with, relationship, and if by phone, the number) Disposition Plan: Pending   Consultants:  Palliative Care  Radiation Oncology  Procedures:  Radiation treatments  HPI/Subjective: No acute events noted overnight. Continues to complain of tongue pain.  Objective: Filed Vitals:   09/08/13 0535 09/08/13 1500 09/08/13 2204 09/09/13 0538   BP: 139/98 124/90 144/103 130/84  Pulse: 85 73 85 84  Temp: 97.7 F (36.5 C) 97.9 F (36.6 C) 98.6 F (37 C) 98.3 F (36.8 C)  TempSrc: Oral Oral Oral Oral  Resp: 20 18 20 18   Height:      Weight:      SpO2: 98% 98% 98% 97%    Intake/Output Summary (Last 24 hours) at 09/09/13 0859 Last data filed at 09/08/13 2230  Gross per 24 hour  Intake    480 ml  Output      0 ml  Net    480 ml   Filed Weights   09/03/13 2130 09/05/13 1525  Weight: 79.788 kg (175 lb 14.4 oz) 82.6 kg (182 lb 1.6 oz)    Exam:   General:  Awake, in nad  Cardiovascular: regular, s1, s2  Respiratory: normal resp effort, no wheezing  Abdomen: soft, nondistended  Musculoskeletal: perfused, no clubbing   Data Reviewed: Basic Metabolic Panel:  Recent Labs Lab 09/03/13 1652 09/03/13 1700 09/04/13 0341  NA 133* 135 136  K 3.7 4.3 4.2  CL 97 100 98  CO2 23  --  22  GLUCOSE 330* 323* 448*  BUN 20 27* 22  CREATININE 0.60 0.90 0.55  CALCIUM 10.0  --  9.3   Liver Function Tests:  Recent Labs Lab 09/03/13 1652  AST 24  ALT 16  ALKPHOS 64  BILITOT 1.2  PROT 7.2  ALBUMIN 3.9   No results found for this basename: LIPASE, AMYLASE,  in the last 168 hours No results found for this basename: AMMONIA,  in the last 168 hours CBC:  Recent Labs Lab 09/03/13 1645 09/03/13 1700  09/04/13 0341 09/08/13 0438  WBC 13.2*  --  12.2* 8.8  NEUTROABS  --   --   --  8.1*  HGB 16.1* 17.0* 14.3 15.2*  HCT 44.6 50.0* 38.7 42.3  MCV 91.8  --  91.7 91.8  PLT 225  --  223 215   Cardiac Enzymes: No results found for this basename: CKTOTAL, CKMB, CKMBINDEX, TROPONINI,  in the last 168 hours BNP (last 3 results) No results found for this basename: PROBNP,  in the last 8760 hours CBG:  Recent Labs Lab 09/08/13 0754 09/08/13 1228 09/08/13 1702 09/08/13 2202 09/09/13 0739  GLUCAP 311* 309* 239* 186* 132*    Recent Results (from the past 240 hour(s))  URINE CULTURE     Status: None   Collection  Time    09/03/13  5:28 PM      Result Value Range Status   Specimen Description URINE, CLEAN CATCH   Final   Special Requests NONE   Final   Culture  Setup Time     Final   Value: 09/03/2013 22:09     Performed at [facility] Count     Final   Value: >=100,000 COLONIES/ML     Performed at [facility]   Culture     Final   Value: Multiple bacterial morphotypes present, none predominant. Suggest appropriate recollection if clinically indicated.     Performed at [facility]   Report Status 09/05/2013 FINAL   Final     Studies: No results found.  Scheduled Meds: . dexamethasone  10 mg Intravenous Q24H  . diltiazem  60 mg Oral Q12H  . famotidine  20 mg Oral BID  . fluconazole  100 mg Oral Daily  . insulin aspart  0-15 Units Subcutaneous TID WC  . insulin aspart  0-5 Units Subcutaneous QHS  . insulin detemir  30 Units Subcutaneous Daily  . levETIRAcetam  500 mg Oral BID  . magic mouthwash  5 mL Oral TID  . magnesium oxide  400 mg Oral BID  . metoprolol tartrate  25 mg Oral BID  . nystatin  5 mL Oral QID  . Rivaroxaban  20 mg Oral Q supper  . sodium chloride  3 mL Intravenous Q12H   Continuous Infusions: . sodium chloride 20 mL/hr (09/06/13 0058)    Principal Problem:   Brain metastases Active Problems:   Diabetes mellitus type 2, uncontrolled, without complications   Small cell lung carcinoma   Atrial fibrillation  Time spent:  Anita Gardner K  [facility] Pager [phone]. If 7PM-7AM, please contact night-coverage at [email or web address], password [facility] 09/09/2013, 8:59 AM  LOS: 6 days

## 2013-09-09 NOTE — Evaluation (Signed)
Occupational Therapy Evaluation Patient Details Name: Anita Gardner MRN: [id number] DOB: 11-Jan-1956 Today's Date: 09/09/2013 Time: 4098-1191 OT Time Calculation (min): 16 min  OT Assessment / Plan / Recommendation History of present illness pt was admitted with AMS.  She has a h/o lung CA(s/p xrt and chemo) with brain mets found on 10/8  Pt had been home with intermittent assistance; had slid out of chair and mother found her on the floor.  Pt has tongue lesion which is causing her pain.  PMH significant for dm, htn, copd.   Clinical Impression   Pt was admitted for the above.  She is appropriate for skilled OT to increase activity tolerance, general strength, and balance to improve safety with adls.  Pt had been having increasing difficulty doing what she needed to do for herself.  Goals in acute are for min to mod A overall.      OT Assessment  Patient needs continued OT Services    Follow Up Recommendations  Supervision/Assistance - 24 hour;SNF;Home health OT (depending progress; assistance)    Barriers to Discharge      Equipment Recommendations  3 in 1 bedside comode    Recommendations for Other Services    Frequency  Min 2X/week    Precautions / Restrictions Precautions Precautions: Fall Restrictions Weight Bearing Restrictions: No   Pertinent Vitals/Pain Pain R hip and mouth:  Not rated.  Repositioned    ADL  Grooming: Wash/dry hands;Wash/dry face;Set up Where Assessed - Grooming: Supine, head of bed up Upper Body Bathing: Moderate assistance Where Assessed - Upper Body Bathing: Supine, head of bed up Lower Body Bathing: Maximal assistance Where Assessed - Lower Body Bathing: Supine, head of bed up;Rolling right and/or left Upper Body Dressing: Moderate assistance Where Assessed - Upper Body Dressing: Supine, head of bed up Lower Body Dressing: +1 Total assistance Where Assessed - Lower Body Dressing: Rolling right and/or left;Supine, head of bed  flat Transfers/Ambulation Related to ADLs: pt fatiqued; agreeable to sitting eob for assessment but didn't want to stand.  Her goal is to walk with AD ADL Comments: Pt limited by endurance and pain.  Recently, she has had increasing difficulty with adls.      OT Diagnosis: Generalized weakness  OT Problem List: Decreased strength;Decreased activity tolerance;Impaired balance (sitting and/or standing);Decreased knowledge of use of DME or AE;Pain OT Treatment Interventions: Self-care/ADL training;Therapeutic exercise;DME and/or AE instruction;Therapeutic activities;Patient/family education;Balance training;Energy conservation   OT Goals(Current goals can be found in the care plan section) Acute Rehab OT Goals Patient Stated Goal: walk with something to hold on to OT Goal Formulation: With patient Time For Goal Achievement: 09/23/13 Potential to Achieve Goals: Good ADL Goals Pt Will Perform Grooming: with set-up;sitting (unsupported) Pt Will Perform Upper Body Bathing: with supervision;sitting Pt Will Perform Lower Body Bathing: with mod assist;with adaptive equipment;sit to/from stand Pt Will Perform Upper Body Dressing: with min assist;sitting Pt Will Transfer to Toilet: with mod assist;stand pivot transfer;bedside commode Additional ADL Goal #1: pt will initiate at least one rest break per session for energy conservation Additional ADL Goal #2: pt will be supervision wtih arom strengthening program to increase strength/endurance for adls.   Visit Information  Last OT Received On: 09/09/13 Assistance Needed: +1 (eob) History of Present Illness: pt was admitted with AMS.  She has a h/o lung CA with brain mets found on 10/8  Pt had been home with intermittent assistance; had slid out of chair and mother found her on the floor.  Pt has  tongue lesion which is causing her pain.  PMH significant for dm, htn, copd.       Prior Functioning     Home Living Family/patient expects to be  discharged to:: Private residence Living Arrangements: Alone Additional Comments: pt had intermittent assistance at home.   Prior Function Level of Independence: Needs assistance Comments: lately pt has not had the energy to do what she needs to do ADL-wise.  She has had intermittent assistance at home.  When she slid out of the recliner, she did not have the strength to get back up Communication Communication: No difficulties Dominant Hand: Right         Vision/Perception     Cognition  Cognition Arousal/Alertness: Awake/alert Behavior During Therapy: WFL for tasks assessed/performed Overall Cognitive Status: Within Functional Limits for tasks assessed    Extremity/Trunk Assessment Upper Extremity Assessment Upper Extremity Assessment: Generalized weakness     Mobility Bed Mobility Bed Mobility: Rolling Right;Rolling Left;Sit to Supine;Supine to Sit Rolling Left: 5: Supervision;With rail Supine to Sit: 3: Mod assist;HOB elevated;With rails Sit to Supine: 3: Mod assist Details for Bed Mobility Assistance: cues for technique     Exercise     Balance Balance Balance Assessed: Yes Static Sitting Balance Static Sitting - Balance Support: Bilateral upper extremity supported Static Sitting - Level of Assistance: 4: Min assist;5: Stand by assistance Static Sitting - Comment/# of Minutes: 4 minutes   End of Session OT - End of Session Activity Tolerance: Patient limited by fatigue;Patient limited by pain Patient left: in bed;with call bell/phone within reach;with bed alarm set  GO     Rayah Fines 09/09/2013, 11:13 AM Marica Otter, OTR/L [phone] 09/09/2013

## 2013-09-10 ENCOUNTER — Ambulatory Visit: Payer: Medicaid Other

## 2013-09-10 ENCOUNTER — Encounter: Payer: Medicaid Other | Admitting: Radiation Oncology

## 2013-09-10 LAB — GLUCOSE, CAPILLARY
Glucose-Capillary: 236 mg/dL — ABNORMAL HIGH (ref 70–99)
Glucose-Capillary: 341 mg/dL — ABNORMAL HIGH (ref 70–99)
Glucose-Capillary: 343 mg/dL — ABNORMAL HIGH (ref 70–99)
Glucose-Capillary: 217 mg/dL — ABNORMAL HIGH (ref 70–99)

## 2013-09-10 NOTE — Progress Notes (Signed)
OT Cancellation Note  Patient Details Name: CARYLE HELGESON MRN: [id number] DOB: Jul 19, 1956   Cancelled Treatment:    Reason Eval/Treat Not Completed: Other (comment) Pt just finishing bath with nursing tech and when OT arrived pt shook her head and stated she couldn't do therapy right now. Will try back at a different time.   Lennox Laity  [phone] 09/10/2013, 11:10 AM

## 2013-09-10 NOTE — Progress Notes (Signed)
09/10/13 1400  Clinical Encounter Type  Visited With Patient  Visit Type Follow-up;Spiritual support;Social support  Referral From Palliative care team  Spiritual Encounters  Spiritual Needs Prayer   Followed up with Anita Gardner per Palliative Care list.  Provided prayer at bedside per her request and reminded her of ongoing chaplain availability.  She was very Adult nurse.  Please page as further needs arise:  [phone].  Thank you.  [address] [place], [place] [phone]

## 2013-09-10 NOTE — Progress Notes (Signed)
Patient WU:[id number] L Gille      DOB: Feb 27, 1956      YNW:[id number]  Patient indisposed with nursing at this time. Discussed with the patient's 2 children potential disposition arrangements. Family desires to see how she does with her initial radiation treatments which will begin again today. They are open and aware of the concept of residential hospice. If Allena continues to do poorly residential placement maybe appropriate however we may be in that gray area of requiring skilled nursing placement with palliative care services to follow. Will attempt to discuss again tomorrow with patient and children. Goals at this time remain to stabilize the patient's current condition recognizing her long-term goal to not be resuscitated but to try to add value quality the time that she has left.   Itzae Miralles L. [name], MD MBA The Palliative Medicine Team at [facility] Phone: [phone] Pager: [phone]

## 2013-09-10 NOTE — Progress Notes (Signed)
Inpatient Diabetes Program Recommendations  AACE/ADA: New Consensus Statement on Inpatient Glycemic Control (2013)  Target Ranges:  Prepandial:   less than 140 mg/dL      Peak postprandial:   less than 180 mg/dL (1-2 hours)      Critically ill patients:  140 - 180 mg/dL  Results for Anita Gardner, Anita Gardner (MRN [id number]) as of 09/10/2013 13:16  Ref. Range 09/08/2013 07:54 09/08/2013 12:28 09/08/2013 17:02 09/08/2013 22:02  Glucose-Capillary Latest Range: 70-99 mg/dL 409 (H) 811 (H) 914 (H) 186 (H)   Results for Anita Gardner, Anita Gardner (MRN [id number]) as of 09/10/2013 13:16  Ref. Range 09/09/2013 07:39 09/09/2013 11:37 09/09/2013 16:58 09/09/2013 22:00  Glucose-Capillary Latest Range: 70-99 mg/dL 086 (H) 578 (H) 469 (H) 264 (H)  Results for Anita Gardner, Anita Gardner (MRN [id number]) as of 09/10/2013 13:16  Ref. Range 09/10/2013 07:34 09/10/2013 11:28  Glucose-Capillary Latest Range: 70-99 mg/dL 244 (H) 010 (H)    Inpatient Diabetes Program Recommendations Insulin - Basal: Please increase Levemir to 35 units daily. Correction (SSI): Please increase Novolog correction to Resistant scale.  Note: Patient has a history of diabetes and takes Glyburide 5 mg BID and Metformin 1000 mg BID as an outpatient for diabetes management.  Currently, patient is ordered to receive Levemir 30 units daily, Novolog 0-15 units AC, and Novolog 0-5 units HS for inpatient glycemic control.  Note patient is ordered Dexamethasone 10 mg Q24H which is contributing to hyperglycemia.  While on steroids, please increase Levemir to 30 units daily and increase Novolog correction to resistant scale.  Will continue to follow.  Thanks, Orlando Penner, RN, MSN, CCRN Diabetes Coordinator Inpatient Diabetes Program [phone] (Team Pager) [phone] ([facility] office) [phone] [facility] office)

## 2013-09-10 NOTE — Progress Notes (Signed)
[facility] PROGRESS NOTE  Anita Gardner ZOX:[id number] DOB: May 10, 1956 DOA: 09/03/2013 PCP: Windell Hummingbird, MD  Assessment/Plan: #Brain metastasis - Cont on Decadron IV 10 mg q24h  - Palliative care consult note reviewed - Keppra- 1000mg  loading dose IV yesterday. Then continue with 500mg  BID. - s/p first radiation tx 09/07/13 - pt refused today's radiation tx - Will follow #Atrial fibrillation without RVR- - Currently rate controlled - On scheduled q12h PO cardizem  - Cont metoprolol 25 mg PO BID  - continue Xarelto for stroke prevention #DM2- A1C 9.9 in 03/2013; only on oral agents metformin and glyburide at home. Pt now on steroids so BGs likely more difficult to control.  - Glucose improved - suspect secondary to decreased PO intake secondary to tongue lesion (see below) -Cont SSI moderate  -Increase Levemir to 30u daily, while on decadron.  -CBGs with meals and qhs  #Hypertension- Stable . Will Cont metoprolol as well as diltiazem per above.  #Tobacco abuse- pt had quit smoking in 03/2013 but started smoking again in past few days after finding out about brain metastases.  -nicotine patch  #Tongue lesion - given immunosuppressed state, may be oral candidiasis. Anifungals ordered. Also likely tongue bite s/p seizure from brain met. However, there is concern for possible metastatic disease to the tongue. This was discussed with Radiation Oncology earlier. See Rad Onc note. Plan for empiric antifungal coverage for now with very close outpatient follow-up re: tongue lesion upon discharge.  Code Status: DNR Family Communication: Pt in room (indicate person spoken with, relationship, and if by phone, the number) Disposition Plan: Pending   Consultants:  Palliative Care  Radiation Oncology  Procedures:  Radiation treatments  HPI/Subjective: No acute events noted overnight. Continues to complain of tongue pain.  Objective: Filed Vitals:   09/09/13 1500  09/09/13 2202 09/10/13 0450 09/10/13 1000  BP: 116/77 136/100 120/90 135/98  Pulse: 94 75 88 101  Temp: 98.1 F (36.7 C) 97.6 F (36.4 C) 97.6 F (36.4 C) 97.4 F (36.3 C)  TempSrc: Oral Oral Oral Oral  Resp: 22 20 18 20   Height:      Weight:      SpO2: 97% 96% 97% 96%    Intake/Output Summary (Last 24 hours) at 09/10/13 1145 Last data filed at 09/10/13 1030  Gross per 24 hour  Intake    840 ml  Output    450 ml  Net    390 ml   Filed Weights   09/03/13 2130 09/05/13 1525  Weight: 79.788 kg (175 lb 14.4 oz) 82.6 kg (182 lb 1.6 oz)    Exam:   General:  Awake, in nad  Cardiovascular: regular, s1, s2  Respiratory: normal resp effort, no wheezing  Abdomen: soft, nondistended  Musculoskeletal: perfused, no clubbing   Data Reviewed: Basic Metabolic Panel:  Recent Labs Lab 09/03/13 1652 09/03/13 1700 09/04/13 0341  NA 133* 135 136  K 3.7 4.3 4.2  CL 97 100 98  CO2 23  --  22  GLUCOSE 330* 323* 448*  BUN 20 27* 22  CREATININE 0.60 0.90 0.55  CALCIUM 10.0  --  9.3   Liver Function Tests:  Recent Labs Lab 09/03/13 1652  AST 24  ALT 16  ALKPHOS 64  BILITOT 1.2  PROT 7.2  ALBUMIN 3.9   No results found for this basename: LIPASE, AMYLASE,  in the last 168 hours No results found for this basename: AMMONIA,  in the last 168 hours CBC:  Recent Labs Lab  09/03/13 1645 09/03/13 1700 09/04/13 0341 09/08/13 0438  WBC 13.2*  --  12.2* 8.8  NEUTROABS  --   --   --  8.1*  HGB 16.1* 17.0* 14.3 15.2*  HCT 44.6 50.0* 38.7 42.3  MCV 91.8  --  91.7 91.8  PLT 225  --  223 215   Cardiac Enzymes: No results found for this basename: CKTOTAL, CKMB, CKMBINDEX, TROPONINI,  in the last 168 hours BNP (last 3 results) No results found for this basename: PROBNP,  in the last 8760 hours CBG:  Recent Labs Lab 09/09/13 1137 09/09/13 1658 09/09/13 2200 09/10/13 0734 09/10/13 1128  GLUCAP 282* 305* 264* 236* 341*    Recent Results (from the past 240 hour(s))   URINE CULTURE     Status: None   Collection Time    09/03/13  5:28 PM      Result Value Range Status   Specimen Description URINE, CLEAN CATCH   Final   Special Requests NONE   Final   Culture  Setup Time     Final   Value: 09/03/2013 22:09     Performed at [facility] Count     Final   Value: >=100,000 COLONIES/ML     Performed at [facility]   Culture     Final   Value: Multiple bacterial morphotypes present, none predominant. Suggest appropriate recollection if clinically indicated.     Performed at [facility]   Report Status 09/05/2013 FINAL   Final     Studies: No results found.  Scheduled Meds: . dexamethasone  10 mg Intravenous Q24H  . diltiazem  60 mg Oral Q12H  . famotidine  20 mg Oral BID  . fluconazole  100 mg Oral Daily  . insulin aspart  0-15 Units Subcutaneous TID WC  . insulin aspart  0-5 Units Subcutaneous QHS  . insulin detemir  30 Units Subcutaneous Daily  . levETIRAcetam  500 mg Oral BID  . magic mouthwash  5 mL Oral TID  . magnesium oxide  400 mg Oral BID  . metoprolol tartrate  25 mg Oral BID  . nystatin  5 mL Oral QID  . Rivaroxaban  20 mg Oral Q supper  . sodium chloride  3 mL Intravenous Q12H   Continuous Infusions: . sodium chloride 20 mL/hr (09/06/13 0058)    Principal Problem:   Brain metastases Active Problems:   Diabetes mellitus type 2, uncontrolled, without complications   Small cell lung carcinoma   Atrial fibrillation  Time spent:  Anita Gardner K  [facility] Pager [phone]. If 7PM-7AM, please contact night-coverage at [email or web address], password [facility] 09/10/2013, 11:45 AM  LOS: 7 days

## 2013-09-11 ENCOUNTER — Encounter: Payer: Medicaid Other | Admitting: Radiation Oncology

## 2013-09-11 ENCOUNTER — Ambulatory Visit: Payer: Medicaid Other

## 2013-09-11 LAB — GLUCOSE, CAPILLARY
Glucose-Capillary: 222 mg/dL — ABNORMAL HIGH (ref 70–99)
Glucose-Capillary: 273 mg/dL — ABNORMAL HIGH (ref 70–99)

## 2013-09-11 MED ORDER — THERA-DERM EX LOTN
TOPICAL_LOTION | CUTANEOUS | Status: DC | PRN
  Filled 2013-09-11: qty 236

## 2013-09-11 MED ORDER — INSULIN DETEMIR 100 UNIT/ML ~~LOC~~ SOLN
35.0000 [IU] | Freq: Every day | SUBCUTANEOUS | Status: DC
  Administered 2013-09-12 – 2013-09-14 (×3): 35 [IU] via SUBCUTANEOUS
  Filled 2013-09-11 (×3): qty 0.35

## 2013-09-11 NOTE — Progress Notes (Signed)
PT Cancellation Note  Patient Details Name: Anita Gardner MRN: [id number] DOB: Jan 07, 1956   Cancelled Treatment:    Reason Eval/Treat Not Completed: Patient declined, no reason specified. Will follow.    Ralene Bathe Kistler 09/11/2013, 11:53 AM [phone]

## 2013-09-11 NOTE — Progress Notes (Signed)
CSW consulted for SNF placement, though note Palliative Medicine Team is following - awaiting Dr. Bruna Potter lead for SNF vs. Residential Hospice placement.   Unice Bailey, LCSW [facility] Clinical Social Worker cell #: [phone]

## 2013-09-11 NOTE — Progress Notes (Signed)
[facility] PROGRESS NOTE  Anita Gardner:[id number] DOB: 1956/10/04 DOA: 09/03/2013 PCP: Windell Hummingbird, MD  Assessment/Plan: #Brain metastasis - Cont on Decadron IV 10 mg q24h  - Palliative care consult note reviewed - Keppra- 1000mg  loading dose IV yesterday. Then continue with 500mg  BID. - s/p first radiation tx 09/07/13 - pt had refused yesterday's radiation tx - Will follow #Atrial fibrillation without RVR- - Currently rate controlled - On scheduled q12h PO cardizem  - Cont metoprolol 25 mg PO BID  - continue Xarelto for stroke prevention #DM2- A1C 9.9 in 03/2013; only on oral agents metformin and glyburide at home. Pt now on steroids so BGs likely more difficult to control.  - Glucose stable, in the 200's -Cont SSI moderate  -Increase Levemir to 35u daily, while on decadron.  -CBGs with meals and qhs  #Hypertension- Stable . Will Cont metoprolol as well as diltiazem per above.  #Tobacco abuse- pt had quit smoking in 03/2013 but started smoking again in past few days after finding out about brain metastases.  -nicotine patch  #Tongue lesion - given immunosuppressed state, may be oral candidiasis. Anifungals ordered. Also likely tongue bite s/p seizure from brain met. However, there is concern for possible metastatic disease to the tongue. This was discussed with Radiation Oncology earlier. See Rad Onc note. Plan for empiric antifungal coverage for now with very close outpatient follow-up re: tongue lesion upon discharge.  Code Status: DNR Family Communication: Pt in room (indicate person spoken with, relationship, and if by phone, the number) Disposition Plan: Pending - possible hospice   Consultants:  Palliative Care  Radiation Oncology  Procedures:  Radiation treatments  HPI/Subjective: No acute events noted overnight. Continues to complain of tongue pain, but slightly improved  Objective: Filed Vitals:   09/10/13 1356 09/10/13 2118 09/11/13 0505  09/11/13 1004  BP: 143/96 126/93 133/97 141/95  Pulse: 96 80 74 82  Temp: 97.6 F (36.4 C) 98.1 F (36.7 C) 97.7 F (36.5 C)   TempSrc: Oral Oral Oral   Resp: 18 19 19    Height:      Weight:      SpO2: 96% 96% 98%     Intake/Output Summary (Last 24 hours) at 09/11/13 1041 Last data filed at 09/11/13 0803  Gross per 24 hour  Intake    840 ml  Output    350 ml  Net    490 ml   Filed Weights   09/03/13 2130 09/05/13 1525  Weight: 79.788 kg (175 lb 14.4 oz) 82.6 kg (182 lb 1.6 oz)    Exam:   General:  Awake, in nad  Cardiovascular: regular, s1, s2  Respiratory: normal resp effort, no wheezing  Abdomen: soft, nondistended  Musculoskeletal: perfused, no clubbing   Data Reviewed: Basic Metabolic Panel: No results found for this basename: NA, K, CL, CO2, GLUCOSE, BUN, CREATININE, CALCIUM, MG, PHOS,  in the last 168 hours Liver Function Tests: No results found for this basename: AST, ALT, ALKPHOS, BILITOT, PROT, ALBUMIN,  in the last 168 hours No results found for this basename: LIPASE, AMYLASE,  in the last 168 hours No results found for this basename: AMMONIA,  in the last 168 hours CBC:  Recent Labs Lab 09/08/13 0438  WBC 8.8  NEUTROABS 8.1*  HGB 15.2*  HCT 42.3  MCV 91.8  PLT 215   Cardiac Enzymes: No results found for this basename: CKTOTAL, CKMB, CKMBINDEX, TROPONINI,  in the last 168 hours BNP (last 3 results) No results found for  this basename: PROBNP,  in the last 8760 hours CBG:  Recent Labs Lab 09/10/13 0734 09/10/13 1128 09/10/13 1643 09/10/13 2112 09/11/13 0722  GLUCAP 236* 341* 343* 217* 222*    Recent Results (from the past 240 hour(s))  URINE CULTURE     Status: None   Collection Time    09/03/13  5:28 PM      Result Value Range Status   Specimen Description URINE, CLEAN CATCH   Final   Special Requests NONE   Final   Culture  Setup Time     Final   Value: 09/03/2013 22:09     Performed at [facility] Count      Final   Value: >=100,000 COLONIES/ML     Performed at [facility]   Culture     Final   Value: Multiple bacterial morphotypes present, none predominant. Suggest appropriate recollection if clinically indicated.     Performed at [facility]   Report Status 09/05/2013 FINAL   Final     Studies: No results found.  Scheduled Meds: . dexamethasone  10 mg Intravenous Q24H  . diltiazem  60 mg Oral Q12H  . famotidine  20 mg Oral BID  . fluconazole  100 mg Oral Daily  . insulin aspart  0-15 Units Subcutaneous TID WC  . insulin aspart  0-5 Units Subcutaneous QHS  . insulin detemir  30 Units Subcutaneous Daily  . levETIRAcetam  500 mg Oral BID  . magic mouthwash  5 mL Oral TID  . magnesium oxide  400 mg Oral BID  . metoprolol tartrate  25 mg Oral BID  . nystatin  5 mL Oral QID  . Rivaroxaban  20 mg Oral Q supper  . sodium chloride  3 mL Intravenous Q12H   Continuous Infusions: . sodium chloride 20 mL/hr (09/06/13 0058)    Principal Problem:   Brain metastases Active Problems:   Diabetes mellitus type 2, uncontrolled, without complications   Small cell lung carcinoma   Atrial fibrillation  Time spent:  CHIU, STEPHEN K  [facility] Pager [phone]. If 7PM-7AM, please contact night-coverage at [email or web address], password [facility] 09/11/2013, 10:41 AM  LOS: 8 days

## 2013-09-11 NOTE — Progress Notes (Signed)
Under treatment note Inpatient  Status post 1 fraction, 3 of 30 Gy.  Visited with Ms Reaume this morning. She was sleepy but arousable.  She acknowledged some recent headaches.  She and I spoke once again about the fact that RT to the brain has a good chance of shrinking the tumors (small cell cancer is generally radiosensitive)  and preventing symptoms such as headaches.  But, treatment would not be curative. I am not sure she is retaining much of what we discuss. I reiterated the potential benefits of WBRT to Dr [name] to bring up in the palliative care family meeting today.  I support whatever decision the patient and family make, whether supportive care only, or WBRT.  Will hold treatments until I hear from Dr. [name] regarding Ms. Mosteller's disposition.  -----------------------------------  Lonie Peak, MD

## 2013-09-11 NOTE — Progress Notes (Signed)
Patient refused radiation treatment again today. Radiation notified. Patient said that she "might be up for it tomorrow".  Will continue to monitor patient. Setzer, Don Broach

## 2013-09-11 NOTE — Progress Notes (Signed)
Occupational Therapy Treatment Patient Details Name: Anita Gardner MRN: [id number] DOB: [birth year] Today's Date: 09/11/2013 Time: 4098-1191 OT Time Calculation (min): 20 min  OT Assessment / Plan / Recommendation       OT comments  Awake and agreeable to participation in skilled o.t., able to assist with ub/lb bathing.  Bed mobility rolling l/r with min a and use of bed rails.  Declines oob today.    Follow Up Recommendations  Supervision/Assistance - 24 hour           Equipment Recommendations  3 in 1 bedside comode    Recommendations for Other Services    Frequency Min 2X/week   Progress towards OT Goals Progress towards OT goals: Progressing toward goals  Plan Discharge plan remains appropriate    Precautions / Restrictions Precautions Precautions: Fall   Pertinent Vitals/Pain 7-8/10, assisted with repositioning     ADL  Grooming: Performed;Wash/dry hands;Wash/dry face;Set up Where Assessed - Grooming: Supine, head of bed up Upper Body Bathing: Performed;Moderate assistance;Chest;Right arm;Left arm;Abdomen Where Assessed - Upper Body Bathing: Supine, head of bed up Lower Body Bathing: Performed;Moderate assistance Where Assessed - Lower Body Bathing: Supine, head of bed up Upper Body Dressing: Performed;Moderate assistance Where Assessed - Upper Body Dressing: Supine, head of bed up Lower Body Dressing: Performed;+1 Total assistance Where Assessed - Lower Body Dressing: Supine, head of bed up Transfers/Ambulation Related to ADLs: pt. declined oob today ADL Comments: assisted with ub/lb bathing and dressing bed level today     OT Goals(current goals can now be found in the care plan section)    Visit Information  Last OT Received On: 09/11/13    Subjective Data   "am i getting bed sores" (in reference to red area on buttocks, informed cna to pass info along)          Cognition  Cognition Arousal/Alertness: Awake/alert Behavior During Therapy: WFL  for tasks assessed/performed Overall Cognitive Status: Within Functional Limits for tasks assessed    Mobility  Bed Mobility Details for Bed Mobility Assistance: able to roll l/r for bathing and linen changes with min a and mod inst. cues for hand placement on bed rails               End of Session OT - End of Session Activity Tolerance: Patient tolerated treatment well Patient left: in bed;with call bell/phone within reach Nurse Communication:  (pt. with red area top of buttocks with small visible skin laceration)       Robet Leu, COTA/L 09/11/2013, 7:59 AM

## 2013-09-11 NOTE — Progress Notes (Addendum)
Patient Anita Gardner Flagler      DOB: July 02, 1956      VWU:[id number]   Palliative Medicine Team at [facility] Progress Note    Subjective:Anita Gardner  is very difficult to arouse with gentle tactile or verbal stimuli he requires intense stimulation to get her to wake from sleep. She apologize for not going to radiation at that he was "too early in the morning for her". She reports her biggest fear is "losing her mind. We talked about the effects of the tumor on her thinking and I reassured her that she was in "going crazy". We did attempt to normalize some of the changes that were happening with her memory to make this less scary. I spoke with her daughter Anita Gardner, we will need tomorrow to talk to [name] about specific placement options. Anita Gardner stated to me that she understands that she can likely not go home to her house.   Filed Vitals:   09/10/13 0505  BP: 14396  Pulse: 96  Temp: 97.6 F (36.7 C)  Resp: 18   Physical exam:  Gen. moderate cognitive impairment, sleeping much more and deeper Pupils remain equal round and reactive to affect is slightly flat Chest is decreased with a deep cough, I don't appreciate rhonchi or wheezing  Cardiovascular irregular rate and rhythm positive S1 and S2 no S3-S4 Abdomen obese soft nontender nondistended she's scratching an area on her abdomen and has caused some redness but I do not see any lesions at this time we'll continue to monitor Extremities: She has an old wound to the left shin stable she is slightly hemiparetic on the left chest at baseline Neuro she has significant memory deficits, she is more somnolent and sleeping more   Assessment and plan: 57 year-old female with 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 She is currently exhibiting significant symptoms of memory loss and somnolence. She is attempting to receive radiation to the brain has been refusing treatments. Will talk with her children in the a.m. regarding disposition as I  feel that she is appropriate for residential hospice. We may opt to see if she could have consolidated treatments to facilitate transition to residential facility.   1 DO NOT RESUSCITATE DO NOT INTUBATE 2. Itching on her abdomen: I do not see a rash but we'll continue to monitor will provide her with Keri lotion 3. Altered mental status: Continue steroids and radiation  Disposition will likely be to residential hospice home if family and patient are agreeable  We'll check with radiation oncology to see if we could consolidate her treatments.  Total time 25 minutes  [name]. [name], MD MBA The Palliative Medicine Team at [facility] Phone: [phone] Pager: [phone]

## 2013-09-12 ENCOUNTER — Ambulatory Visit: Payer: Medicaid Other

## 2013-09-12 DIAGNOSIS — C7931 Secondary malignant neoplasm of brain: Secondary | ICD-10-CM

## 2013-09-12 DIAGNOSIS — C7949 Secondary malignant neoplasm of other parts of nervous system: Secondary | ICD-10-CM

## 2013-09-12 DIAGNOSIS — E1165 Type 2 diabetes mellitus with hyperglycemia: Secondary | ICD-10-CM

## 2013-09-12 DIAGNOSIS — C3490 Malignant neoplasm of unspecified part of unspecified bronchus or lung: Secondary | ICD-10-CM

## 2013-09-12 DIAGNOSIS — Z515 Encounter for palliative care: Secondary | ICD-10-CM

## 2013-09-12 DIAGNOSIS — C801 Malignant (primary) neoplasm, unspecified: Secondary | ICD-10-CM

## 2013-09-12 LAB — BASIC METABOLIC PANEL
BUN: 22 mg/dL (ref 6–23)
Calcium: 9.4 mg/dL (ref 8.4–10.5)
Chloride: 94 mEq/L — ABNORMAL LOW (ref 96–112)
Creatinine, Ser: 0.64 mg/dL (ref 0.50–1.10)
GFR calc Af Amer: >90 mL/min (ref >90)
GFR calc non Af Amer: >90 mL/min (ref >90)
Glucose, Bld: 179 mg/dL — ABNORMAL HIGH (ref 70–99)
Potassium: 4.2 mEq/L (ref 3.5–5.1)

## 2013-09-12 LAB — GLUCOSE, CAPILLARY
Glucose-Capillary: 173 mg/dL — ABNORMAL HIGH (ref 70–99)
Glucose-Capillary: 295 mg/dL — ABNORMAL HIGH (ref 70–99)
Glucose-Capillary: 188 mg/dL — ABNORMAL HIGH (ref 70–99)

## 2013-09-12 LAB — CBC
HCT: 46.4 % — ABNORMAL HIGH (ref 36.0–46.0)
Hemoglobin: 17.1 g/dL — ABNORMAL HIGH (ref 12.0–15.0)
MCH: 33.3 pg (ref 26.0–34.0)
MCHC: 36.9 g/dL — ABNORMAL HIGH (ref 30.0–36.0)
MCV: 90.3 fL (ref 78.0–100.0)
Platelets: 236 10*3/uL (ref 150–400)
RDW: 12.5 % (ref 11.5–15.5)
WBC: 13.2 10*3/uL — ABNORMAL HIGH (ref 4.0–10.5)

## 2013-09-12 NOTE — Progress Notes (Signed)
Palliative Medicine Team Progress Note  I met with Anita Gardner this afternoon and also spoke with Dr. Ardyth Harps re: next steps in her care given that she continues to refuse radiation after repeatedly agreeing to having the treatment- when the person comes to get her she refuses with a variety of excuses (too early, waiting on her son or daughter to come etc..) which are really just related to her overall fear of her disease and treatment options as well as loss of control.   Today she has no complaints. I asked her what her next steps were and what her understanding was of her condition and she just repeated "Anita Gardner I just dont know, I just dont know" She is overwhelmed and unable to make a decision. She does tell me that she knows she cant live alone. I asked her what her biggest fear was and she said "dying alone". I then asked her what her biggest fear was about a "hospice facility/[facility]" and she said the same thing. She does not understand what her options may look or feel like and is mostly afraid.  She has wide spread brain mets from small cell lung cancer and multiple other chronic medical problems. Her prognosis is probably a few weeks with fully supportive comfort care.  I think she will benefit greatly from talking to Forrestine Him, CSW from [facility] explore what [facility] can offer and also to talk about her fears and have her questions answered. I will ask her to meet patient and assist with this process. Patient's daughter is agreeable to hospice facility.  Anderson Malta, DO Palliative Medicine  35 minutes. Greater than 50%  of this time was spent counseling and coordinating care related to the above assessment and plan.

## 2013-09-12 NOTE — Progress Notes (Addendum)
Pt refused radiation again. Says that she will do it later, but still refuses it when offered later. MD was notified.

## 2013-09-12 NOTE — Progress Notes (Signed)
[facility] PROGRESS NOTE  Anita Gardner XBJ:[id number] DOB: 12/30/1955 DOA: 09/03/2013 PCP: Windell Hummingbird, MD  Assessment/Plan: #Brain metastasis - Transition Decadron to PO 10 mg q24h  - Keppra- 1000mg  loading dose IV yesterday. Then continue with 500mg  BID. - s/p  radiation simulation 09/07/13 -  -She has continued to refuse radiation treatments.  #Atrial fibrillation without RVR- - Currently rate controlled - On scheduled q12h PO cardizem  - Cont metoprolol 25 mg PO BID  - continue Xarelto for stroke prevention  #DM2- A1C 9.9 in 03/2013; only on oral agents metformin and glyburide at home. Pt now on steroids so BGs likely more difficult to control.  - Glucose stable, in the 200's -Cont SSI moderate  -Increase Levemir to 35u daily, while on decadron.  -CBGs with meals and qhs   #Hypertension- Stable . Will Cont metoprolol as well as diltiazem per above.   #Tobacco abuse- pt had quit smoking in 03/2013 but started smoking again in past few days after finding out about brain metastases.  -nicotine patch   #Tongue lesion - likely from tongue biting with possible seizure prior to admission.  Code Status: DNR Family Communication: Discussed with daughter [name], via telephone. Disposition Plan: Pending - possible hospice.   Consultants:  Palliative Care  Radiation Oncology  Procedures:  Radiation treatments  HPI/Subjective: No acute events noted overnight. Has again refused radiation today.  Objective: Filed Vitals:   09/11/13 1410 09/11/13 2203 09/12/13 0506 09/12/13 0924  BP: 130/91 134/94 130/85 123/91  Pulse: 92 75 91 82  Temp: 98.1 F (36.7 C) 98.1 F (36.7 C) 97.6 F (36.4 C)   TempSrc: Oral Oral Oral   Resp: 20 19 18    Height:      Weight:      SpO2: 98% 98% 96%     Intake/Output Summary (Last 24 hours) at 09/12/13 1059 Last data filed at 09/11/13 1846  Gross per 24 hour  Intake    600 ml  Output      0 ml  Net    600 ml    Filed Weights   09/03/13 2130 09/05/13 1525  Weight: 79.788 kg (175 lb 14.4 oz) 82.6 kg (182 lb 1.6 oz)    Exam:   General:  Awake, in nad  Cardiovascular: regular, s1, s2  Respiratory: normal resp effort, no wheezing  Abdomen: soft, nondistended  Musculoskeletal: perfused, no clubbing   Data Reviewed: Basic Metabolic Panel:  Recent Labs Lab 09/12/13 0520  NA 132*  K 4.2  CL 94*  CO2 30  GLUCOSE 179*  BUN 22  CREATININE 0.64  CALCIUM 9.4   Liver Function Tests: No results found for this basename: AST, ALT, ALKPHOS, BILITOT, PROT, ALBUMIN,  in the last 168 hours No results found for this basename: LIPASE, AMYLASE,  in the last 168 hours No results found for this basename: AMMONIA,  in the last 168 hours CBC:  Recent Labs Lab 09/08/13 0438 09/12/13 0520  WBC 8.8 13.2*  NEUTROABS 8.1*  --   HGB 15.2* 17.1*  HCT 42.3 46.4*  MCV 91.8 90.3  PLT 215 236   Cardiac Enzymes: No results found for this basename: CKTOTAL, CKMB, CKMBINDEX, TROPONINI,  in the last 168 hours BNP (last 3 results) No results found for this basename: PROBNP,  in the last 8760 hours CBG:  Recent Labs Lab 09/11/13 0722 09/11/13 1127 09/11/13 1711 09/11/13 2201 09/12/13 0753  GLUCAP 222* 273* 124* 117* 173*    Recent Results (from the past  240 hour(s))  URINE CULTURE     Status: None   Collection Time    09/03/13  5:28 PM      Result Value Range Status   Specimen Description URINE, CLEAN CATCH   Final   Special Requests NONE   Final   Culture  Setup Time     Final   Value: 09/03/2013 22:09     Performed at [facility] Count     Final   Value: >=100,000 COLONIES/ML     Performed at [facility]   Culture     Final   Value: Multiple bacterial morphotypes present, none predominant. Suggest appropriate recollection if clinically indicated.     Performed at [facility]   Report Status 09/05/2013 FINAL   Final     Studies: No results  found.  Scheduled Meds: . dexamethasone  10 mg Intravenous Q24H  . diltiazem  60 mg Oral Q12H  . famotidine  20 mg Oral BID  . fluconazole  100 mg Oral Daily  . insulin aspart  0-15 Units Subcutaneous TID WC  . insulin aspart  0-5 Units Subcutaneous QHS  . insulin detemir  35 Units Subcutaneous Daily  . levETIRAcetam  500 mg Oral BID  . magic mouthwash  5 mL Oral TID  . magnesium oxide  400 mg Oral BID  . metoprolol tartrate  25 mg Oral BID  . nystatin  5 mL Oral QID  . Rivaroxaban  20 mg Oral Q supper  . sodium chloride  3 mL Intravenous Q12H   Continuous Infusions: . sodium chloride 20 mL/hr (09/06/13 0058)    Principal Problem:   Brain metastases Active Problems:   Diabetes mellitus type 2, uncontrolled, without complications   Small cell lung carcinoma   Atrial fibrillation  Time spent:  HERNANDEZ ACOSTA,ESTELA  [facility] Pager [phone]. If 7PM-7AM, please contact night-coverage at [email or web address], password [email or web address] 09/12/2013, 10:59 AM  LOS: 9 days

## 2013-09-13 ENCOUNTER — Ambulatory Visit: Payer: Medicaid Other

## 2013-09-13 ENCOUNTER — Ambulatory Visit
Admit: 2013-09-13 | Discharge: 2013-09-13 | Disposition: A | Discharge status: Home / Self Care | Payer: Medicaid Other | Attending: Radiation Oncology | Admitting: Radiation Oncology

## 2013-09-13 DIAGNOSIS — R609 Edema, unspecified: Secondary | ICD-10-CM

## 2013-09-13 LAB — GLUCOSE, CAPILLARY
Glucose-Capillary: 239 mg/dL — ABNORMAL HIGH (ref 70–99)
Glucose-Capillary: 294 mg/dL — ABNORMAL HIGH (ref 70–99)
Glucose-Capillary: 373 mg/dL — ABNORMAL HIGH (ref 70–99)
Glucose-Capillary: 235 mg/dL — ABNORMAL HIGH (ref 70–99)

## 2013-09-13 MED ORDER — GLUCERNA SHAKE PO LIQD
237.0000 mL | Freq: Two times a day (BID) | ORAL | Status: DC | PRN
  Filled 2013-09-13: qty 237

## 2013-09-13 MED ORDER — ADULT MULTIVITAMIN W/MINERALS CH
1.0000 | ORAL_TABLET | Freq: Every day | ORAL | Status: DC
  Filled 2013-09-13 (×2): qty 1

## 2013-09-13 MED ORDER — MAGNESIUM HYDROXIDE 400 MG/5ML PO SUSP: 30.0000 mL | Freq: Every day | ORAL | Status: DC | PRN

## 2013-09-13 MED ORDER — DEXAMETHASONE 6 MG PO TABS
10.0000 mg | ORAL_TABLET | Freq: Every day | ORAL | Status: DC
  Administered 2013-09-13 – 2013-09-14 (×2): 10 mg via ORAL
  Filled 2013-09-13 (×2): qty 1

## 2013-09-13 NOTE — Progress Notes (Signed)
INITIAL NUTRITION ASSESSMENT  DOCUMENTATION CODES Per approved criteria  -Obesity Unspecified   INTERVENTION: Provide Glucerna Shake BID Provide Magic Cup once daily Provide Multivitamin with minerals daily  NUTRITION DIAGNOSIS: Inadequate oral intake related to varied appetite and oral discomfort as evidenced by varied PO intake of 25-100% of meals.   Goal: Pt to meet >/= 90% of their estimated nutrition needs   Monitor:  PO intake Acceptance of nutritional supplements Weight Labs   Reason for Assessment: Length of Stay, day 34  57 y.o. female  Admitting Dx: Brain metastases  ASSESSMENT: 57 year old woman with history of small cell lung cancer diagnosis s/p radiation and chemo (carboplatin, etoposide, neulasta x 5 cycles; finished on 06/20/13) with multiple brain mets seen on 10/8 MRI, atrial fibrillation, DM2, HTN, COPD who presented to ED after a fall. Per recent palliative note pt's prognosis is probably a few weeks with full supportive comfort care.  Pt underwent radiation this afternoon and reports feeling better. Pt complains of oral pain but, reports lidocaine mouth wash helps. Pt states her appetite has varied since admission and PTA. Per nursing notes pt has been eating 75-100% of meals the past couple days but, previously pt was eating 25-50% of most meals. Per chart history, pt's usual body weight is 205 lbs (December 2013). Per MD note/H&P, pt's weight on admission was 175 lbs.  Height: Ht Readings from Last 1 Encounters:  09/05/13 5\' 5"  (1.651 m)    Weight: Wt Readings from Last 1 Encounters:  09/05/13 182 lb 1.6 oz (82.6 kg)    Ideal Body Weight: 125 lbs  % Ideal Body Weight: 146%  Wt Readings from Last 10 Encounters:  09/05/13 182 lb 1.6 oz (82.6 kg)  08/31/13 185 lb 8 oz (84.142 kg)  07/19/13 187 lb 12.8 oz (85.186 kg)  07/03/13 190 lb (86.183 kg)  06/22/13 186 lb 3.2 oz (84.46 kg)  06/20/13 190 lb 1.6 oz (86.229 kg)  06/18/13 194 lb 11.2 oz  (88.315 kg)  06/12/13 193 lb 2 oz (87.6 kg)  06/12/13 193 lb 3.2 oz (87.635 kg)  05/21/13 186 lb 6.4 oz (84.55 kg)    Usual Body Weight: 205 lbs (December 2013)  % Usual Body Weight: 89%  BMI:  Body mass index is 30.3 kg/(m^2).  Estimated Nutritional Needs: Kcal: 1900-2100 Protein: 120-130 grams Fluid: 2.4 L/day  Skin: non-pitting RLE and LLE edema; stage 2 pressure ulcer to sacrum; red and excoriated buttocks with rash to groin area  Diet Order: Carb Control  EDUCATION NEEDS: -No education needs identified at this time   Intake/Output Summary (Last 24 hours) at 09/13/13 1515 Last data filed at 09/13/13 1500  Gross per 24 hour  Intake    240 ml  Output    800 ml  Net   -560 ml    Last BM: 10/20   Labs:   Recent Labs Lab 09/12/13 0520  NA 132*  K 4.2  CL 94*  CO2 30  BUN 22  CREATININE 0.64  CALCIUM 9.4  GLUCOSE 179*    CBG (last 3)   Recent Labs  09/12/13 2211 09/13/13 0753 09/13/13 1143  GLUCAP 188* 239* 294*    Scheduled Meds: . dexamethasone  10 mg Oral Daily  . diltiazem  60 mg Oral Q12H  . famotidine  20 mg Oral BID  . fluconazole  100 mg Oral Daily  . insulin aspart  0-15 Units Subcutaneous TID WC  . insulin aspart  0-5 Units Subcutaneous QHS  .  insulin detemir  35 Units Subcutaneous Daily  . levETIRAcetam  500 mg Oral BID  . magic mouthwash  5 mL Oral TID  . magnesium oxide  400 mg Oral BID  . metoprolol tartrate  25 mg Oral BID  . nystatin  5 mL Oral QID  . Rivaroxaban  20 mg Oral Q supper  . sodium chloride  3 mL Intravenous Q12H    Continuous Infusions: . sodium chloride 20 mL/hr (09/06/13 0058)    Past Medical History  Diagnosis Date  . Chronic pain of left knee     secondary to arthritis  . Hypertension   . GERD (gastroesophageal reflux disease)   . Constipation   . Anxiety   . Depression   . Panic attacks   . Bipolar 1 disorder   . COPD (chronic obstructive pulmonary disease)   . Status post chemotherapy  03/01/2013-5/22/2014concurrent with Radiation Therapy    carboplatin for AUC of 5 on day 1 and etoposide 120 mg/M2 days 1, 2 and 3 with Neulasta support on day 4. The patient is status post 4 cycles. This is concurrent with radiation.  . S/P radiation therapy 03/01/2013-04/12/2013    Left Lung and Nodes/ 62 Gy in 31 fractions  . High cholesterol   . Atrial fibrillation   . Diabetes mellitus type 2, uncontrolled, without complications DX: 2000    on oral medications only  . Diabetic peripheral neuropathy   . Anemia   . History of blood transfusion     "twice w/cancer treatments" (09/03/2013)  . Incontinence of urine   . 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 Tyrone Sage (CVTS), Dr. Gwenyth Bouillon (Onc) // LLL mass noted 11/2012// PET scan 12/2012 - mediastinal + left infrahilar nodal metastasis AND low left mediastinum or posterior left pericardium is hypermetabolic likely nodal metastasis // Stage IIIA, T1bN2MX at diagnosis // Bronchoscopy 01/2013 confirmed small cell ca. // Rad tx 04-06/14 // chemo started 04/14  . Brain metastases     /notes 09/03/2013    Past Surgical History  Procedure Laterality Date  . Skin graft Left     as a child; "bruise that never went away;Dr. Nonie Hoyer did OR; skin graft to leg" (09/03/2013)   . Video bronchoscopy with endobronchial ultrasound N/A 02/14/2013    Procedure: VIDEO BRONCHOSCOPY WITH ENDOBRONCHIAL ULTRASOUND;  Surgeon: Delight Ovens, MD;  Location: [facility] OR;  Service: Thoracic;  Laterality: N/A;    Ian Malkin RD, LDN Inpatient Clinical Dietitian Pager: [phone] After Hours Pager: [phone]

## 2013-09-13 NOTE — Progress Notes (Signed)
Clinical Social Work  CSW followed up with [employer] and [employer]. [facility]) will come and evaluate patient on 10/24 and report several beds available if patient is appropriate for services. [facility]) feels patient is appropriate but needs to check bed status and asked CSW to call on 10/24. CSW updated dtr via phone and will keep her updated.  CSW will continue to follow.  Unk Lightning, LCSW (Coverage for [employer])

## 2013-09-13 NOTE — Progress Notes (Signed)
PT Cancellation Note  ___Treatment cancelled today due to medical issues with patient which prohibited therapy  ___ Treatment cancelled today due to patient receiving procedure or test   ___ Treatment cancelled today due to patient's refusal to participate   _X_ Treatment cancelled today due to pt's request.  "Not right now maybe later".  Even offered BSC as pt needed to void but she declined that so assisted with bed pan.  No Tx.  Felecia Shelling  PTA [facility]  Acute  Rehab Pager      [phone]

## 2013-09-13 NOTE — Progress Notes (Signed)
Inpatient Diabetes Program Recommendations  AACE/ADA: New Consensus Statement on Inpatient Glycemic Control (2013)  Target Ranges:  Prepandial:   less than 140 mg/dL      Peak postprandial:   less than 180 mg/dL (1-2 hours)      Critically ill patients:  140 - 180 mg/dL   Hyperglycemia sustained in 200's range.  Pt now eating well.  Inpatient Diabetes Program Recommendations Insulin - Basal: Please consider another increase in the lantus dose to 40 units (fasting glucose in 200's) Correction (SSI): xxx Insulin - Meal Coverage: Please consider addition of Novolog meal coverage 3 units tidwc (keeping the correction scales as ordered) Pt is eating 100%  Thank you, Lenor Coffin, RN, CNS, Diabetes Coordinator [phone])

## 2013-09-13 NOTE — Consult Note (Signed)
[facility] Liaison: Received call from [place] Cookie inquiring about [facility] availability. Made Cookie aware [facility] is full at this time. Unfortunately have had to tell several families today no [facility] expected today or tomorrow with several families hoping to get there. Please do not hesitate to call with questions. Thank you. Forrestine Him LCSW [phone]

## 2013-09-13 NOTE — Progress Notes (Signed)
Palliative Medicine Team  Progress Note   Palliative care follow up and met patient at bedside.  Patient states that she feels fine except for mild generalized pain which is well controlled with her current pain management. She reports that her last BM was three days ago and would like to have some laxatives.  She states, " I need to have the radiation treatment today and tomorrow, please find out when I can go." Of note, she has been refusing the radiation since admission.    Filed Vitals:   09/13/13 0620  BP: 123/90  Pulse: 80  Temp: 98.1 F (36.7 C)  Resp: 18  General: NAD. Calm and pleasant Lungs: CTAB Heart: irregularly irregular, no M/R/G Abd: Soft, NTND. BS x 4 Ext; No edema  Assessment:  Anita Gardner is a 57 year old woman with a PMH of small cell lung cancer (Stage IIIA, T1bN2MX at diagnosis s/p radiation and chemo (carboplatin, etoposide, neulasta x 5 cycles; finished on 06/20/13) with multiple brain mets seen on 10/8 MRI), ,Afib, T2DM, HTN and Tobacco abuse.   She was admitted to the hospital on 09/03/13 for evaluation brain mets with associated fall and AMS. She was transferred to [facility] for further management and radiation therapy. However, she repeatedly refuses the radiation treatment even though she states that she wants the treatment. Palliative care team was consulted for GOC and symptoms management. She is motivate to have radiation today and appreciate assistance from Forrestine Him discussing [employer].   Plan  - DNR status - Continue current symptoms management.   She has good pain control.  Will add Milk of Magnesium for constipation.  - Radiation therapy at 1130 am today. - Residential hospice [facility] notes reviewed - Prognosis is probably a few weeks with fully supportive comfort care.  - Appreciate Rad Onc support and recs-treatments are purely palliative. Doubt they will extend her prognosis significantly but will help with symptoms. Per rad onc patient  will receive 30 Gy in 10 fractions to her whole brain.    She really wants to go home-but this will take a commitment from her family to assist and ensure her safety.   Anderson Malta, DO Palliative MedicineDede Query, MD PGY-3 [phone] (cell)

## 2013-09-13 NOTE — Consult Note (Signed)
[facility] Liaison: Received request from Dr. Phillips Odor to see patient for [facility] education. Chart reviewed and met with patient who was alert and oriented and very focused on making sure someone knows she wants radiation today and tomorrow. Made RN and MD aware after this visit. Note patient has been refusing radiation. We talked about [facility] and how we can provide care for her and support her in trying to accomplish her goals. Most important to her at this time is getting her house cleaned up. She reports she knows she is confused. She acknowledged she does not have anyone to be with her at home which is why she is trying to figure out where she will go after she completes radiation. Spoke with MD following this visit. Will attempt phone contact with daughter [name]. Thank you. Forrestine Him LCSW [phone]

## 2013-09-13 NOTE — Progress Notes (Signed)
[facility] PROGRESS NOTE  Anita Gardner ZOX:[id number] DOB: 05-29-1956 DOA: 09/03/2013 PCP: Windell Hummingbird, MD  Assessment/Plan: #Brain metastasis - Transition Decadron to PO 10 mg q24h  - Keppra- 1000mg  loading dose IV yesterday. Then continue with 500mg  BID. - s/p  radiation simulation 09/07/13 -  -She has continued to refuse radiation treatments, despite telling us that she wants them (her thought process seems disorganized and she is confused at times).  #Atrial fibrillation without RVR- - Currently rate controlled - On scheduled q12h PO cardizem  - Cont metoprolol 25 mg PO BID  - continue Xarelto for stroke prevention  #DM2- A1C 9.9 in 03/2013; only on oral agents metformin and glyburide at home. Pt now on steroids so BGs likely more difficult to control.  - Glucose stable, in the 200's -Cont SSI moderate  -Increase Levemir to 35u daily, while on decadron.  -CBGs with meals and qhs   #Hypertension- Stable . Will Cont metoprolol as well as diltiazem per above.   #Tobacco abuse- pt had quit smoking in 03/2013 but started smoking again in past few days after finding out about brain metastases.  -nicotine patch   #Tongue lesion - likely from tongue biting with possible seizure prior to admission.  Code Status: DNR Family Communication: Discussed with daughter [name], via telephone. Disposition Plan: Pending - possible hospice.   Consultants:  Palliative Care  Radiation Oncology  Procedures:  Radiation treatments (patient is refusing)  HPI/Subjective: No acute events noted overnight.   Objective: Filed Vitals:   09/12/13 0924 09/12/13 1416 09/13/13 0019 09/13/13 0620  BP: 123/91 149/95 134/88 123/90  Pulse: 82 56 81 80  Temp:  97.9 F (36.6 C) 98 F (36.7 C) 98.1 F (36.7 C)  TempSrc:  Oral Oral Oral  Resp:  20 18 18   Height:      Weight:      SpO2:  97% 97% 98%    Intake/Output Summary (Last 24 hours) at 09/13/13 1045 Last data filed at  09/12/13 1715  Gross per 24 hour  Intake    240 ml  Output    500 ml  Net   -260 ml   Filed Weights   09/03/13 2130 09/05/13 1525  Weight: 79.788 kg (175 lb 14.4 oz) 82.6 kg (182 lb 1.6 oz)    Exam:   General:  Awake, in nad  Cardiovascular: regular, s1, s2  Respiratory: normal resp effort, no wheezing  Abdomen: soft, nondistended  Musculoskeletal: perfused, no clubbing   Data Reviewed: Basic Metabolic Panel:  Recent Labs Lab 09/12/13 0520  NA 132*  K 4.2  CL 94*  CO2 30  GLUCOSE 179*  BUN 22  CREATININE 0.64  CALCIUM 9.4   Liver Function Tests: No results found for this basename: AST, ALT, ALKPHOS, BILITOT, PROT, ALBUMIN,  in the last 168 hours No results found for this basename: LIPASE, AMYLASE,  in the last 168 hours No results found for this basename: AMMONIA,  in the last 168 hours CBC:  Recent Labs Lab 09/08/13 0438 09/12/13 0520  WBC 8.8 13.2*  NEUTROABS 8.1*  --   HGB 15.2* 17.1*  HCT 42.3 46.4*  MCV 91.8 90.3  PLT 215 236   Cardiac Enzymes: No results found for this basename: CKTOTAL, CKMB, CKMBINDEX, TROPONINI,  in the last 168 hours BNP (last 3 results) No results found for this basename: PROBNP,  in the last 8760 hours CBG:  Recent Labs Lab 09/12/13 0753 09/12/13 1134 09/12/13 1709 09/12/13 2211 09/13/13 0753  GLUCAP 173* 295* 194* 188* 239*    Recent Results (from the past 240 hour(s))  URINE CULTURE     Status: None   Collection Time    09/03/13  5:28 PM      Result Value Range Status   Specimen Description URINE, CLEAN CATCH   Final   Special Requests NONE   Final   Culture  Setup Time     Final   Value: 09/03/2013 22:09     Performed at [facility] Count     Final   Value: >=100,000 COLONIES/ML     Performed at [facility]   Culture     Final   Value: Multiple bacterial morphotypes present, none predominant. Suggest appropriate recollection if clinically indicated.     Performed at  [facility]   Report Status 09/05/2013 FINAL   Final     Studies: No results found.  Scheduled Meds: . dexamethasone  10 mg Intravenous Q24H  . diltiazem  60 mg Oral Q12H  . famotidine  20 mg Oral BID  . fluconazole  100 mg Oral Daily  . insulin aspart  0-15 Units Subcutaneous TID WC  . insulin aspart  0-5 Units Subcutaneous QHS  . insulin detemir  35 Units Subcutaneous Daily  . levETIRAcetam  500 mg Oral BID  . magic mouthwash  5 mL Oral TID  . magnesium oxide  400 mg Oral BID  . metoprolol tartrate  25 mg Oral BID  . nystatin  5 mL Oral QID  . Rivaroxaban  20 mg Oral Q supper  . sodium chloride  3 mL Intravenous Q12H   Continuous Infusions: . sodium chloride 20 mL/hr (09/06/13 0058)    Principal Problem:   Brain metastases Active Problems:   Diabetes mellitus type 2, uncontrolled, without complications   Small cell lung carcinoma   Atrial fibrillation  Time spent:  HERNANDEZ ACOSTA,ESTELA  [facility] Pager [phone]. If 7PM-7AM, please contact night-coverage at [email or web address], password [email or web address] 09/13/2013, 10:45 AM  LOS: 10 days

## 2013-09-13 NOTE — Progress Notes (Signed)
OT Cancellation Note  Patient Details Name: Anita Gardner MRN: [id number] DOB: [birth year]   Cancelled Treatment:    Reason Eval/Treat Not Completed: Fatigue/lethargy limiting ability to participate Pt agreeable to work with OT on bath initially but changed mind and states she is too tired after getting back from radiation. She requests OT try back later time. Attempted to encourage a little activity with OT but pt still refuses.  Lennox Laity [phone] 09/13/2013, 1:20 PM

## 2013-09-13 NOTE — Progress Notes (Signed)
Clinical Social Work Department BRIEF PSYCHOSOCIAL ASSESSMENT 09/13/2013  Patient:  Anita Gardner, Anita Gardner     Account Number:  [id number]     Admit date:  09/04/2013  Clinical Social Worker:  Dennison Bulla  Date/Time:  09/13/2013 02:30 PM  Referred by:  Physician  Date Referred:  09/13/2013 Referred for  Psychosocial assessment   Other Referral:   Interview type:  Patient Other interview type:   Dtr involved    PSYCHOSOCIAL DATA Living Status:  ALONE Admitted from facility:   Level of care:   Primary support name:  Frankie Primary support relationship to patient:  CHILD, ADULT Degree of support available:   Strong    CURRENT CONCERNS Current Concerns  Post-Acute Placement   Other Concerns:    SOCIAL WORK ASSESSMENT / PLAN CSW received referral to assist with DC planning. CSW met with patient, dtr, and CM in room. CSW introduced myself and explained role.    Patient reports she and dtr have been discussing DC plans. Patient was living at home alone but fell and came to the hospital. Patient's dtr reports concerns regarding patient returning home because she and brother work and cannot provide 24 hour care. CSW and CM discussed all DC options from [facility], home with hospice, SNF placement and residential hospice. Patient and dtr are still discussing options but are interested not interested in SNF placement. 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 CSW explained barriers to SNF placement with Medicaid only and needing to supplement payments. Dtr and patient want more information for residential hospice. [facility] report no available beds. Dtr reports that [facility] are close for family to visit patient.    CSW received permission to fax information. CSW spoke with [facility] and [place] hospice homes who are agreeable to review information to determine if they can accept patient. Dtr and patient are aware  that patient is stable for DC and agreeable to work with CM and CSW in a timely manner to determine disposition.    CSW will continue to follow.   Assessment/plan status:  Psychosocial Support/Ongoing Assessment of Needs Other assessment/ plan:   Information/referral to community resources:   Residential hospice referrals.    PATIENT'S/FAMILY'S RESPONSE TO PLAN OF CARE: Patient alert and engaged in assessment. Patient thankful for dtr's opinion and agreeable for her to assist with placement. Dtr reports feeling torn about making decisions for patient and sent time talking with CM and CSW in the hallway about concerns. CSW allowed dtr time to voice concerns and validated her feelings. CSW praised dtr for assisting patient and encouraged her to continue to support patient. Patient and dtr appreciative of assistance.       Unk Lightning, LCSW (Coverage for [employer])

## 2013-09-13 NOTE — Progress Notes (Signed)
Spoke with pt who was a little confused, she want to stay here at [facility]. This CM Spoke with pt's daughter [name] [phone]), concerning discharge plans and disposition. Daughter states that she would like for the pt to go to [facility], if no beds are available, stay at [facility] until a bed is available.  This CM explained to [facility], if the pt is medically stable and there are no beds at [facility], she would need to look at other options for disposition of the pt. Will continue to follow for discharge needs.

## 2013-09-14 ENCOUNTER — Ambulatory Visit
Admit: 2013-09-14 | Discharge: 2013-09-14 | Disposition: A | Discharge status: Home / Self Care | Payer: Medicaid Other | Attending: Radiation Oncology | Admitting: Radiation Oncology

## 2013-09-14 LAB — GLUCOSE, CAPILLARY
Glucose-Capillary: 132 mg/dL — ABNORMAL HIGH (ref 70–99)
Glucose-Capillary: 190 mg/dL — ABNORMAL HIGH (ref 70–99)
Glucose-Capillary: 210 mg/dL — ABNORMAL HIGH (ref 70–99)

## 2013-09-14 MED ORDER — DILTIAZEM HCL ER 60 MG PO CP12: 60.0000 mg | ORAL_CAPSULE | Freq: Two times a day (BID) | ORAL | Status: AC

## 2013-09-14 MED ORDER — OXYCODONE-ACETAMINOPHEN 5-325 MG PO TABS: 1.0000 | ORAL_TABLET | ORAL | Status: AC | PRN

## 2013-09-14 MED ORDER — INSULIN DETEMIR 100 UNIT/ML ~~LOC~~ SOLN: 35.0000 [IU] | Freq: Every day | SUBCUTANEOUS | Status: AC

## 2013-09-14 MED ORDER — DEXAMETHASONE 2 MG PO TABS: 10.0000 mg | ORAL_TABLET | Freq: Every day | ORAL | Status: AC

## 2013-09-14 MED ORDER — LEVETIRACETAM 500 MG PO TABS
500.0000 mg | ORAL_TABLET | Freq: Two times a day (BID) | ORAL | Status: AC
Start: 2013-09-14 — End: ?

## 2013-09-14 NOTE — Progress Notes (Signed)
PT Cancellation Note  Patient Details Name: Anita Gardner MRN: [id number] DOB: 05-15-1956   Cancelled Treatment:    Reason Eval/Treat Not Completed: Other (comment) (pt is waiting on bed at residential hospice.)   Rada Hay 09/14/2013, 8:47 AM Blanchard Kelch PT [phone]

## 2013-09-14 NOTE — Plan of Care (Signed)
Problem: Discharge Progression Outcomes Goal: Discharge plan in place and appropriate Outcome: Completed/Met Date Met:  09/14/13 Discharge packet prepared by CSW for the facility patient is going to and given to EMS transporter.

## 2013-09-14 NOTE — Progress Notes (Signed)
Clinical Social Work  CSW spoke with son Remi Deter) via phone. Son reports he is going to work but will talk with boss about getting time off. Son reports that he and his sister have been making plans for DC and reports that he will contact sister to assist with making arrangements.   CSW will continue to follow.  [place], [place] [phone]

## 2013-09-14 NOTE — Progress Notes (Addendum)
Assumed care of patient. No changes in initial PM assessment . Cont with current plan of care. 

## 2013-09-14 NOTE — Progress Notes (Signed)
Clinical Social Work  CSW continues to follow patient for hospice placement. CSW spoke with Delray Alt at [facility] who reports there is a daily morning meeting to review referrals at 9am. [facility] reports they have left a message with family with no response. CSW tried calling family as well in order to inform them to call hospice. CSW will wait to hear from hospice home and will continue to try and contact family.  Unk Lightning, LCSW (Coverage for [employer])

## 2013-09-14 NOTE — Progress Notes (Signed)
Clinical Social Work  Patient accepted to [facility] and dtr completed paperwork. Patient nervous about transfer and asked to DC tomorrow. CSW explained that patient had been DC and had safe environment. After long discussion with hospice, dtr and CSW, patient was finally agreeable to hospice home. CSW prepared DC packet and informed RN of DC plans.  CSW prepared DC packet with DNR form included. CSW coordinated transportation via [place]. Request # [phone].  CSW is signing off but available if needed.  Unk Lightning, LCSW  (Coverage for [employer])

## 2013-09-14 NOTE — Progress Notes (Signed)
Patient discharged to [facility] via ambulance. Patient alert, denies any pain/distress. Skin intact, no wound. Report given to Joann-RN at the facility patient is being transferred to, to F/U with plan of care. Discharge packet prepared by CSW and given to EMS transporters.

## 2013-09-14 NOTE — Progress Notes (Signed)
Note to Chart: Anita Gardner expressed a desire yesterday and today to receive whole brain RT - she received treatments on these days. She is now status post 3 out of 10 planned fractions.  -----------------------------------  Lonie Peak, MD

## 2013-09-14 NOTE — Discharge Summary (Signed)
Physician Discharge Summary  Anita Gardner:[id number] DOB: 05-Nov-1956 DOA: 09/03/2013  PCP: Windell Hummingbird, MD  Admit date: 09/03/2013 Discharge date: 09/14/2013  Time spent: 45 minutes  Recommendations for Outpatient Follow-up:  -[facility]   Discharge Diagnoses:  Principal Problem:   Brain metastases Active Problems:   Diabetes mellitus type 2, uncontrolled, without complications   Small cell lung carcinoma   Atrial fibrillation   Discharge Condition: Stable  Filed Weights   09/03/13 2130 09/05/13 1525  Weight: 79.788 kg (175 lb 14.4 oz) 82.6 kg (182 lb 1.6 oz)    History of present illness:  Anita Gardner is a 57 year old woman with history of small cell lung cancer (Stage IIIA, T1bN2MX at diagnosis s/p radiation and chemo (carboplatin, etoposide, neulasta x 5 cycles; finished on 06/20/13) with multiple brain mets seen on 10/8 MRI), atrial fibrillation (on metoprolol and Xarelto), DM2, HTN, COPD who presented to ED after a fall.  Patient states that this morning 10/13, she was sitting in her recliner when she slid off of it and did not have the strength to pull herself back up. She was on the floor for a couple of hours before her mother came to check on her and found her lying there incontinent to urine and feces. Patient states she did not lose consciousness or hit or head. No pain at present.  On ROS, decreased appetite but no difficulty swallowing; occasional nausea but no vomiting. Denies fever, chest pain, palpitations, shortness of breath, cough, abdominal pain, diarrhea.  She is supposed to take metoprolol 25 mg BID at home for rate control of her atrial fibrillation. She manages her own medications and does not think she has been taking that one.  She did recently start smoking again after hearing news about her multiple brain metastases discovered on MRI brain on 08/29/13, unclear how much.  In ED, atient given IV Decadron  10 mg for vasogenic brain edema as well as IV metoprolol 5 mg x 3 doses with improvement to HR 101. She was initially admitted by the Internal Medicine Teaching Service, and later transferred to [facility] under the care of [facility].   Hospital Course:   Brain metastasis  - Transition Decadron to PO 10 mg q24h  - Keppra- 500mg  BID for seizure prophylaxis..  - s/p radiation simulation 09/07/13 -  -She has continued to refuse radiation treatments, despite telling us that she wants them (her thought process seems disorganized and she is confused at times).   #Atrial fibrillation without RVR-  - Currently rate controlled  - On scheduled q12h PO cardizem  - Cont metoprolol 25 mg PO BID  - continue Xarelto for stroke prevention  -These are medications that will likely be stopped in the near future as goals of care transition to mainly being comfort care driven.  #DM2- A1C 9.9 in 03/2013; only on oral agents metformin and glyburide at home. Pt now on steroids so BGs likely more difficult to control.  - Glucose stable, in the 200's  -Cont SSI moderate  -Increase Levemir to 35u daily, while on decadron.  -CBGs with meals and qhs   #Hypertension- Stable . Will Cont metoprolol as well as diltiazem per above.   #Tobacco abuse- pt had quit smoking in 03/2013 but started smoking again in past few days after finding out about brain metastases.  -nicotine patch   #Tongue lesion - likely from tongue biting with possible seizure prior to admission.  Procedures:  None   Consultations:  Rad Onc  Palliative Care  Discharge Instructions  Discharge Orders   Future Appointments Provider Department Dept Phone   09/14/2013 2:35 PM [facility] RADIATION ONCOLOGY [phone]   09/17/2013 1:35 PM [facility] RADIATION ONCOLOGY [phone]   09/18/2013 1:30 PM [facility] RADIATION  ONCOLOGY [phone]   09/19/2013 11:00 AM Windell Hummingbird [facility] MEDICAL ONCOLOGY [phone]   09/19/2013 12:00 PM [facility] IMAG[facility] [phone]   Patient to arrive 15 minutes prior to appointment time. Patient to pick up oral contrast at least 1 day prior to exam, unless otherwise instructed by your physician. No solid food 4 hours prior to exam. Liquids and Medicines are okay.   09/19/2013 3:00 PM [facility] RADIATION ONCOLOGY [phone]   09/20/2013 11:30 AM Si Gaul, MD [facility] MEDICAL ONCOLOGY [phone]   09/20/2013 1:30 PM [facility] RADIATION ONCOLOGY [phone]   09/24/2013 11:50 AM [facility] RADIATION ONCOLOGY [phone]   09/25/2013 10:00 AM Windell Hummingbird, MD [facility] [phone]   09/25/2013 11:50 AM [facility] RADIATION ONCOLOGY [phone]   09/26/2013 11:50 AM [facility] RADIATION ONCOLOGY [phone]   09/27/2013 10:30 AM [facility] [place] [facility] RADIATION ONCOLOGY [phone]   10/16/2013 2:00 PM Wendall Stade, MD [facility] [place] Office [phone]   Future Orders Complete By Expires   Discontinue IV  As directed    Increase activity slowly  As directed        Medication List    STOP taking these medications       acetaminophen 650 MG CR tablet  Commonly known as:  TYLENOL     Fish Oil 1200 MG Caps     furosemide 20 MG tablet  Commonly known as:  LASIX     glyBURIDE 5 MG tablet  Commonly known as:  DIABETA     metFORMIN 1000 MG tablet  Commonly known as:  GLUCOPHAGE     oxybutynin 5 MG tablet  Commonly known as:  DITROPAN     Vitamin D-3 5000 UNITS Tabs      TAKE these medications       dexamethasone 2 MG tablet  Commonly known as:   DECADRON  Take 5 tablets (10 mg total) by mouth daily.     diltiazem 60 MG 12 hr capsule  Commonly known as:  CARDIZEM SR  Take 1 capsule (60 mg total) by mouth every 12 (twelve) hours.     insulin detemir 100 UNIT/ML injection  Commonly known as:  LEVEMIR  Inject 0.35 mLs (35 Units total) into the skin daily.     levETIRAcetam 500 MG tablet  Commonly known as:  KEPPRA  Take 1 tablet (500 mg total) by mouth 2 (two) times daily.     magnesium oxide 400 MG tablet  Commonly known as:  MAG-OX  Take 1 tablet (400 mg total) by mouth 2 (two) times daily.     metoprolol tartrate 25 MG tablet  Commonly known as:  LOPRESSOR  Take 1 tablet (25 mg total) by mouth 2 (two) times daily.     multivitamin with minerals Tabs tablet  Take 1 tablet by mouth daily.  oxyCODONE-acetaminophen 5-325 MG per tablet  Commonly known as:  PERCOCET/ROXICET  Take 1-2 tablets by mouth every 3 (three) hours as needed.     prochlorperazine 10 MG tablet  Commonly known as:  COMPAZINE  Take 10 mg by mouth every 6 (six) hours as needed (for nausea).     ranitidine 150 MG tablet  Commonly known as:  ZANTAC  Take 150 mg by mouth 2 (two) times daily.     Rivaroxaban 20 MG Tabs tablet  Commonly known as:  XARELTO  Take 1 tablet (20 mg total) by mouth daily with supper.       Allergies  Allergen Reactions  . Ampicillin Hives  . Codeine Nausea And Vomiting           The results of significant diagnostics from this hospitalization (including imaging, microbiology, ancillary and laboratory) are listed below for reference.    Significant Diagnostic Studies: Dg Chest 2 View  09/05/2013   CLINICAL DATA:  Cough and congestion.  History of lung cancer.  EXAM: CHEST  2 VIEW  COMPARISON:  CHEST x-ray 09/04/2013.  Prior CT examinations.  FINDINGS: Ill-defined interstitial prominence throughout the left lower lobe, likely to represent evolving post radiation changes. No definite acute consolidative airspace  disease. Small left pleural effusion. No evidence of pulmonary edema. Heart size is mildly enlarged (unchanged). Upper mediastinal contours are within normal limits. Atherosclerosis in the thoracic aorta.  IMPRESSION: 1. The appearance of the chest is similar to prior studies, favored to reflect evolving postradiation changes in the lower left hemithorax. Strictly speaking, left lower lobe bronchopneumonia is difficult to entirely exclude, but is not strongly favored. Clinical correlation is recommended.   Electronically Signed   By: Trudie Reed M.D.   On: 09/05/2013 12:43   Ct Head Wo Contrast  09/06/2013   CLINICAL DATA:  Altered mental status. Lung cancer with known brain metastases.  EXAM: CT HEAD WITHOUT CONTRAST  TECHNIQUE: Contiguous axial images were obtained from the base of the skull through the vertex without intravenous contrast.  COMPARISON:  Head CT scan 09/03/2013 and brain MRI 08/29/2013.  FINDINGS: Scattered metastatic deposits in the brain are again seen and unchanged. No hemorrhage, hydrocephalus, midline shift, acute infarction or abnormal extra-axial fluid collection is identified the calvarium is intact.  IMPRESSION: No acute finding in patient with extensive metastatic disease to the brain. Stable compared to the examination 3 days ago.   Electronically Signed   By: Drusilla Kanner M.D.   On: 09/06/2013 14:57   Ct Head W Wo Contrast  09/03/2013   CLINICAL DATA:  Lung cancer. Documented metastases to the brain. Found on floor unresponsive today.  EXAM: CT HEAD WITHOUT AND WITH CONTRAST  TECHNIQUE: Contiguous axial images were obtained from the base of the skull through the vertex without and with intravenous contrast  CONTRAST:  80mL OMNIPAQUE IOHEXOL 300 MG/ML  SOLN  COMPARISON:  MR brain 08/29/2013.  FINDINGS: Noncontrast exam demonstrates no midline shift or hydrocephalus. Areas of abnormal vasogenic edema are seen throughout both cerebral hemispheres, as well as the cerebellar  vermis. Post infusion, abnormal enhancement of the previously demonstrated multiple intracranial metastases is noted. Based on long axis measurement of index lesions in the right temporal lobe (21 mm) and left frontal lobe (17 mm), the lesions appear roughly stable.  No intracranial hemorrhage in this patient who is anticoagulated. No skull fracture is evident. No sinus or mastoid disease.  IMPRESSION: Multiple intracranial metastatic lesions appear unchanged from 08/29/2013 when technique  differences are considered. No acute findings to suggest intracranial hemorrhage. No visible skull fracture.   Electronically Signed   By: Davonna Belling M.D.   On: 09/03/2013 18:05   Mr Laqueta Jean ZO Contrast  08/29/2013   CLINICAL DATA:  57 year old female with lung cancer. Weakness and loss of balance. Restaging. Subsequent encounter.  BUN and creatinine were obtained on site at [facility] at  [address]  Results:  BUN 8 mg/dL,  Creatinine 0.5 mg/dL.  EXAM: MRI HEAD WITHOUT AND WITH CONTRAST  TECHNIQUE: Multiplanar, multiecho pulse sequences of the brain and surrounding structures were obtained according to standard protocol without and with intravenous contrast  CONTRAST:  18mL MULTIHANCE GADOBENATE DIMEGLUMINE 529 MG/ML IV SOLN  COMPARISON:  02/25/2013.  FINDINGS: There are now numerous brain masses, most with combined rim and heterogeneous central enhancement. All of the larger lesions demonstrate restricted diffusion compatible with hypercellularity. Relatively mild degrees of cerebral edema occur. No significant intracranial mass effect at this time. Most of the lesions are about 15 mm diameter. The largest are up to 25 mm diameter.  Approximately 15 metastases are identified.  No superimpose restricted diffusion suggestive of acute infarct. Stable cerebral volume. Negative pituitary, cervicomedullary junction and visualized cervical spine. No ventriculomegaly. No acute intracranial hemorrhage identified.  Major intracranial vascular flow voids are stable.  Visualized orbit soft tissues are within normal limits. Visualized paranasal sinuses and mastoids are clear. Visualized scalp soft tissues are within normal limits.  Visualized bone marrow signal is stable and within normal limits.  IMPRESSION: 1. Widespread metastatic disease to the brain. Approximately 15 metastases are identified ranging from 10-25 mm diameter.  2. Relatively mild cerebral edema. No significant intracranial mass effect at this time.  These results will be called to the ordering clinician or representative by the Radiologist Assistant, and communication documented in the PACS Dashboard.   Electronically Signed   By: Augusto Gamble M.D.   On: 08/29/2013 19:12   Portable Chest 1 View  09/04/2013   CLINICAL DATA:  Cough and shortness of breath.  EXAM: PORTABLE CHEST - 1 VIEW  COMPARISON:  02/14/2013  FINDINGS: Cardiopericardial enlargement, which appears similar to 06/12/2013 CT. There is a left base opacity, a combination of lung opacification and pleural fluid. Indistinct right base opacity, which may be related to hypoaeration. No evidence of pneumothorax.  IMPRESSION: Left base atelectasis or pneumonia with small pleural effusion.   Electronically Signed   By: Tiburcio Pea M.D.   On: 09/04/2013 05:36    Microbiology: No results found for this or any previous visit (from the past 240 hour(s)).   Labs: Basic Metabolic Panel:  Recent Labs Lab 09/12/13 0520  NA 132*  K 4.2  CL 94*  CO2 30  GLUCOSE 179*  BUN 22  CREATININE 0.64  CALCIUM 9.4   Liver Function Tests: No results found for this basename: AST, ALT, ALKPHOS, BILITOT, PROT, ALBUMIN,  in the last 168 hours No results found for this basename: LIPASE, AMYLASE,  in the last 168 hours No results found for this basename: AMMONIA,  in the last 168 hours CBC:  Recent Labs Lab 09/08/13 0438 09/12/13 0520  WBC 8.8 13.2*  NEUTROABS 8.1*  --   HGB 15.2* 17.1*  HCT  42.3 46.4*  MCV 91.8 90.3  PLT 215 236   Cardiac Enzymes: No results found for this basename: CKTOTAL, CKMB, CKMBINDEX, TROPONINI,  in the last 168 hours BNP: BNP (last 3 results) No results found for  this basename: PROBNP,  in the last 8760 hours CBG:  Recent Labs Lab 09/13/13 1143 09/13/13 1725 09/13/13 2123 09/14/13 0807 09/14/13 1205  GLUCAP 294* 373* 235* 132* 190*       Signed:  HERNANDEZ ACOSTA,ESTELA  [facility] Pager: [phone] 09/14/2013, 1:28 PM

## 2013-09-14 NOTE — Progress Notes (Signed)
Clinical Social Work  CSW spoke with dtr who confirms she and brother want patient to be placed at [facility]. CSW spoke with Hospice representative Delray Alt) who reports she will contact family to complete paperwork and will call CSW once ready to accept patient. MD aware and reports she will complete DC paperwork. CSW will continue to follow.  Unk Lightning, LCSW (Coverage for [employer])

## 2013-09-14 NOTE — Progress Notes (Signed)
Patient Anita Gardner      DOB: June 02, 1956      VWU:[id number]   Palliative Medicine Team at [facility] Progress Note    Subjective: Extensive conversation regarding use of residential hospice options.  Patient evidently had been threatened by a close family member with " if you don't do radiation you will have to go to hospice". Patient awakened with a start when I arrived. Stating i will do radiation, i don't want to go to hospice" with the help of the patient's children I educated patient on what hospice was really about and that it did not represent a punishment for not going to radiation. Patient reports slight headache but no other symptoms. Children understand options for discharge planning if patient continues to refuse radiation.     Filed Vitals:   09/11/13 2203  BP: 134/94  Pulse: 75  Temp: 98.1 F (36.7 C)  Resp: 19   Physical exam:   General : anxiety which can be redirected Perrl, eomi Chest: decreased but clear Cvs: regular rate and rhythm  Extremities some non pitting edema Neuro some short term memory deficits , executive function with variable defect        Assessment and plan: 57 yr old with metastatic lung cancer, multiple brain mets.  Patient continues to decline in cognitive functions.  She has now heard her options are to participate and in radiation versus to decide to focus on full comfort using steroids for symptom management. We were able to educate her on the positive qualities of hospice and further discussions will be had from our team.  1. dnr   2. Continue emotional support of patient and children.    Total time : 45 min  Vernell Townley L. [name], MD MBA The Palliative Medicine Team at [facility] Phone: [phone] Pager: [phone]

## 2013-09-14 NOTE — Progress Notes (Addendum)
Assumed care of patient. No changes in initial PM assessment . Cont with current plan of care. Son and daughter are at the bedside

## 2013-09-17 ENCOUNTER — Ambulatory Visit: Payer: Medicaid Other

## 2013-09-17 ENCOUNTER — Encounter: Payer: Self-pay | Admitting: Radiation Oncology

## 2013-09-18 ENCOUNTER — Ambulatory Visit: Payer: Medicaid Other

## 2013-09-18 ENCOUNTER — Telehealth: Payer: Self-pay | Admitting: Internal Medicine

## 2013-09-18 NOTE — Telephone Encounter (Signed)
Received message from switchboard 10/27 that pt called to cx her appt including xrt and scans due to she is now on hospice. Message to MM and per reply ok to cx. appt cx'd. radonc appts already removed as radonc was copied on message to MM.

## 2013-09-19 ENCOUNTER — Ambulatory Visit (HOSPITAL_COMMUNITY): Admission: RE | Admit: 2013-09-19 | Payer: Medicaid Other | Source: Ambulatory Visit

## 2013-09-19 ENCOUNTER — Ambulatory Visit: Payer: Medicaid Other

## 2013-09-19 ENCOUNTER — Other Ambulatory Visit: Payer: No Typology Code available for payment source | Admitting: Lab

## 2013-09-20 ENCOUNTER — Ambulatory Visit: Payer: Medicaid Other

## 2013-09-20 ENCOUNTER — Ambulatory Visit: Payer: No Typology Code available for payment source | Admitting: Internal Medicine

## 2013-09-21 ENCOUNTER — Ambulatory Visit: Payer: Medicaid Other

## 2013-09-24 ENCOUNTER — Ambulatory Visit: Payer: Medicaid Other

## 2013-09-24 NOTE — Progress Notes (Signed)
[facility] Radiation Oncology End of Treatment Note  Name:Anita Gardner  Date: 09/17/2013 HQI:[id number] DOB:[birth year]    DIAGNOSIS: Metastatic Small Cell Lung Cancer to the brain    INDICATION FOR TREATMENT: Palliative   TREATMENT DATES: 09-07-13 to 09-14-13                         SITE/DOSE:   1) Whole Brain with intended dose or 30 Gy in 10 fractions (she only received 9 Gy in 3 fractions before going to a hospice facility in [facility] and declining further treatments)                        BEAMS/ENERGY:      1) 2 fields / photons           NARRATIVE:    [name] demonstrated varying degrees of confusion and willingness to receive her treatments as an inpatient. Ultimately, with the guidance of her family, palliative medicine, and the inpatient team, the patient and family opted for placement in a hospice facility at [facility] and to stop radiotherapy..  Dr. Basilio Cairo spoke extensively with the palliative care team and the hospice nurse at [facility] in regards to [name]'s disposition. At the time of placement in hospice, she was reportedly comfortable on supportive care.             PLAN: Hospice in [facility] facility. Followup PRN in radiation oncology.  -----------------------------------  Lonie Peak, MD

## 2013-09-25 ENCOUNTER — Ambulatory Visit: Payer: Medicaid Other

## 2013-09-25 ENCOUNTER — Ambulatory Visit: Payer: No Typology Code available for payment source | Admitting: Internal Medicine

## 2013-09-26 ENCOUNTER — Ambulatory Visit: Payer: Medicaid Other

## 2013-09-27 ENCOUNTER — Ambulatory Visit: Payer: Medicaid Other

## 2013-09-28 ENCOUNTER — Ambulatory Visit: Payer: Medicaid Other

## 2013-10-01 ENCOUNTER — Ambulatory Visit: Payer: Medicaid Other

## 2013-10-16 ENCOUNTER — Ambulatory Visit: Payer: No Typology Code available for payment source | Admitting: Cardiovascular Disease

## 2013-11-23 ENCOUNTER — Telehealth: Payer: Self-pay | Admitting: Medical Oncology

## 2013-12-05 ENCOUNTER — Other Ambulatory Visit: Payer: Self-pay | Admitting: *Deleted

## 2013-12-23 NOTE — Telephone Encounter (Signed)
Pt died  

## 2013-12-23 DEATH — deceased

## 2014-07-16 ENCOUNTER — Other Ambulatory Visit: Payer: Self-pay | Admitting: Pharmacist

## 2014-07-29 IMAGING — PT NM PET TUM IMG INITIAL (PI) SKULL BASE T - THIGH
4 series · 25 of 25 positions shown · non-contrast
Comparison: Abdominal pelvic CT 12/06/2012

CLINICAL DATA: Initial treatment strategy for left lower lobe lung
nodule.  Smoker..

NUCLEAR MEDICINE PET SKULL BASE TO THIGH
Fasting Blood Glucose:  389
TECHNIQUE: 18.8 mCi F-18 FDG was injected intravenously. CT data
was obtained and used for attenuation correction and anatomic
localization only.  (This was not acquired as a diagnostic CT
examination.) Additional exam technical data entered on
technologist worksheet.

[Series 2: ct images · axial · 3.8mm · 0.98mm/px · z∈[-866,+4]mm · 11 of 267 slices shown]
[im 1/267]
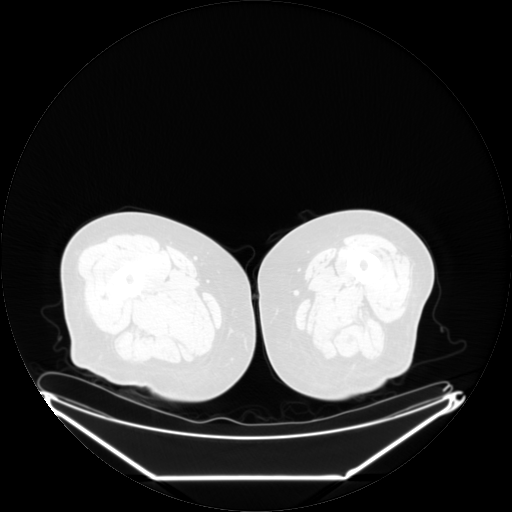
[im 27/267]
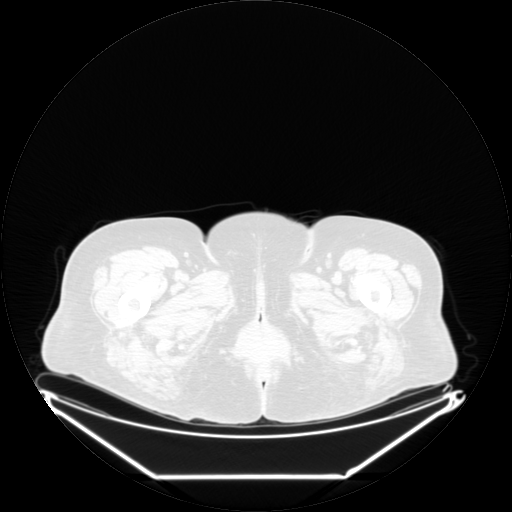
[im 54/267]
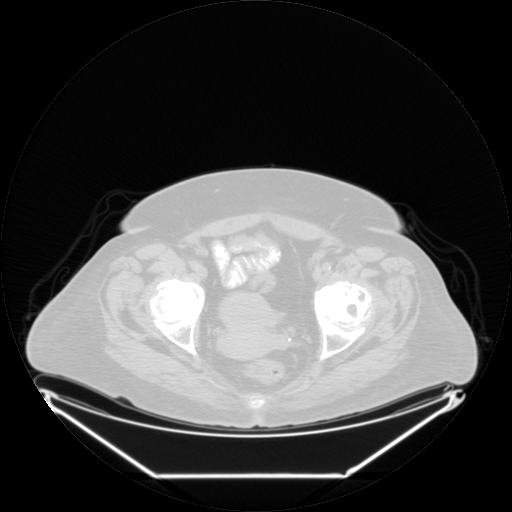
[im 80/267]
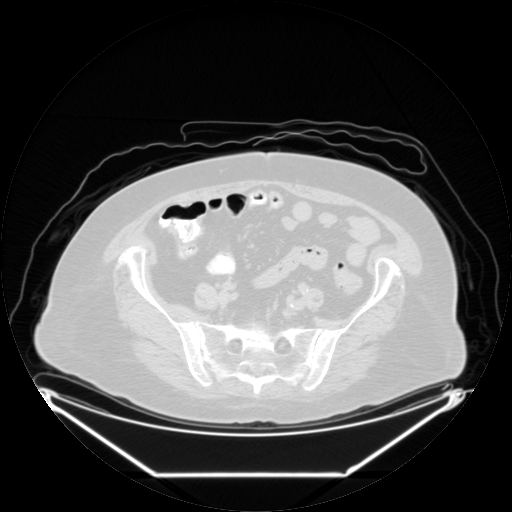
[im 107/267]
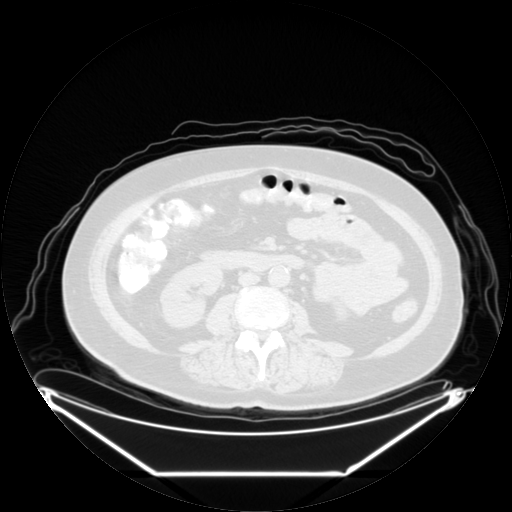
[im 134/267]
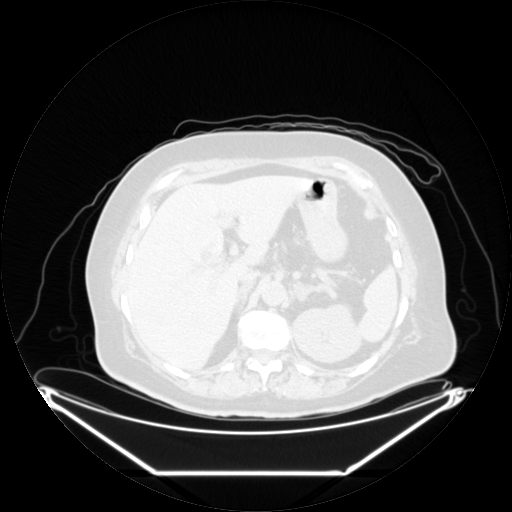
[im 160/267]
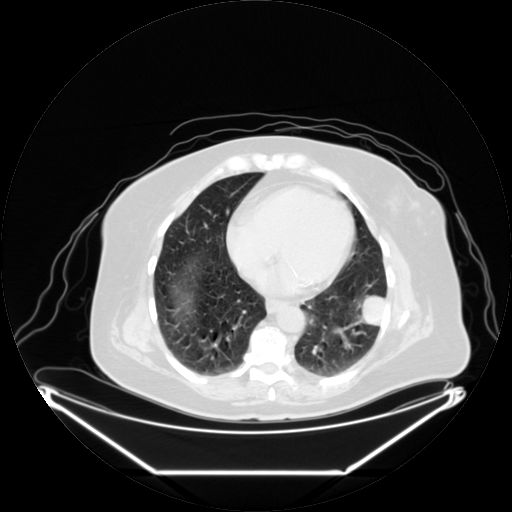
[im 187/267]
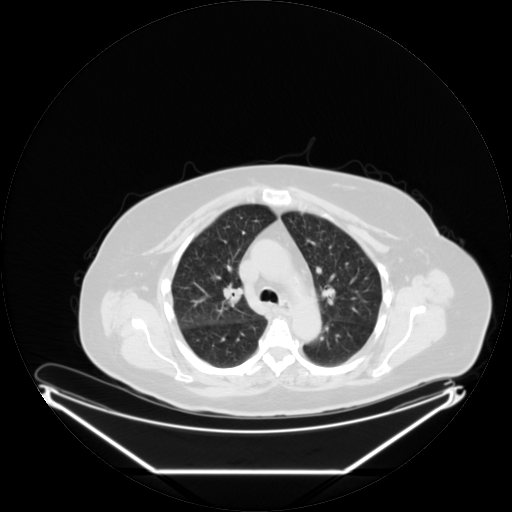
[im 213/267]
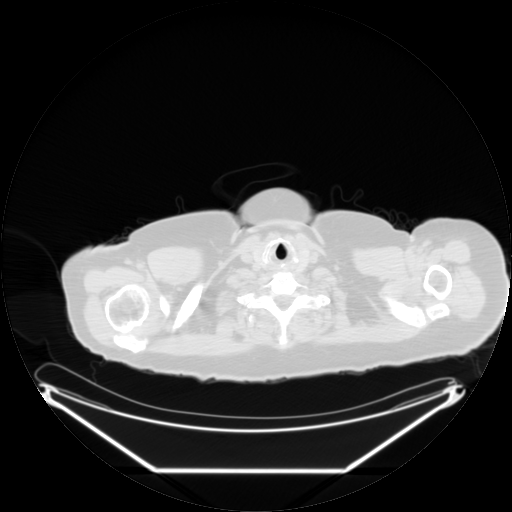
[im 240/267]
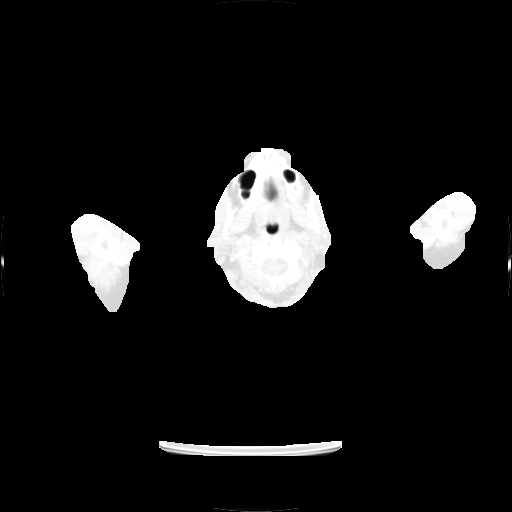
[im 267/267  brain]
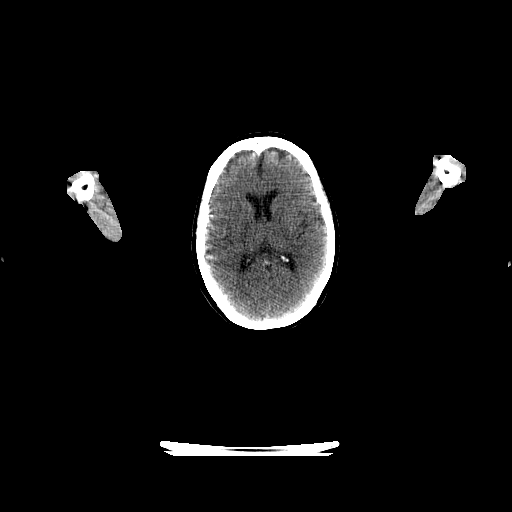

[Series 123: mip · coronal · 3.3mm · 4.69mm/px · 1 of 26 slices shown]
[im 1/26]
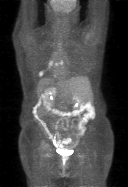

[Series 151: reformatted · axial · 3.3mm · 3.91mm/px · z∈[-866,+4]mm · 10 of 267 slices shown (1 of 2)]
[im 1/267]
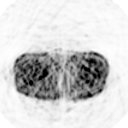
[im 30/267]
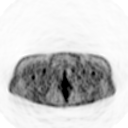
[im 60/267]
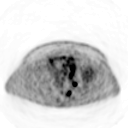
[im 89/267]
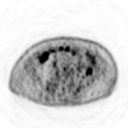
[im 119/267]
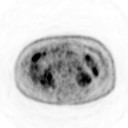
[im 148/267]
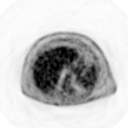
[im 178/267]
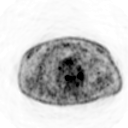
[im 207/267]
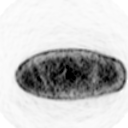
[im 237/267]
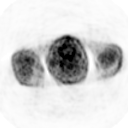
[im 267/267]
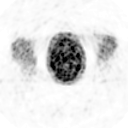

[Series 153: reformatted · coronal · 4.7mm · 6.98mm/px · 3 of 80 slices shown (2 of 2)]
[im 1/80]
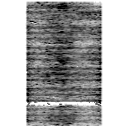
[im 40/80]
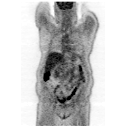
[im 80/80]
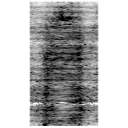

[25 of 25 positions shown; findings below may reference images not displayed]

FINDINGS: Neck: No abnormal hypermetabolism.

Chest:  Hypermetabolism corresponding to the left lower lobe
pulmonary nodule.  This measures 2.6 x 2.7 cm on image 108/series
2.  Enlarged from 1.6 x 1.4 cm on 12/06/2012.  Hypermetabolic, a
S.U.V. max of 7.8.

Subcarinal hypermetabolic node which measures 1.7 cm and a S.U.V.
max of 6.5.
Left sided mediastinal node measures 1.7 cm a S.U.V. max of 4.8 on
image 94/series 2.
Left infrahilar nodal metastasis which measures 1.2 cm and a S.U.V.
max of 6.7 on image 94/series 2.On the inferior aspect of the left
sided mediastinum, versus arising from the pericardium, is a 1.8 x
1.7 cm hypermetabolic nodule which measuresa S.U.V. max of 4.8 on
image 110/series 2.

Abdomen/Pelvis:  No abnormal hypermetabolism.

Skeleton:  No abnormal osseous metabolism.

CT  images performed for attenuation correction demonstrate mild
cerebral atrophy for age.

Sub centimeter left thyroid nodule which is nonspecific.Multivessel
coronary artery atherosclerosis.  Mild left adrenal thickening,
without hypermetabolism.  Foci of focal steatosis suspected in
posterior aspect of segment 4 of the liver.  Example image
131/series 2.  Advanced left greater than right hip osteoarthritis.
IMPRESSION: 1.  Left lower lobe primary bronchogenic carcinoma (enlarged since
12/06/2012) with mediastinal and left infrahilar nodal metastasis.
2.  Nodule along either the low left mediastinum or posterior left
pericardium is hypermetabolic and suspicious for nodal metastasis.
Less likely, a synchronous primary pulmonary lesion could look
similar.
3.  No evidence of extrathoracic disease.
4. Age advanced coronary artery atherosclerosis.  Recommend
assessment of coronary risk factors and consideration of medical
therapy.

## 2015-04-17 ENCOUNTER — Telehealth: Payer: Self-pay | Admitting: Internal Medicine

## 2015-04-17 NOTE — Telephone Encounter (Signed)
Mailed pt's medical records to pt dtr.
